# Patient Record
Sex: Female | Born: 1935 | Race: Black or African American | Hispanic: No | Smoking: Never smoker
Health system: Southern US, Community
[De-identification: ages and names within clinical notes are randomized; demographics above are authoritative.]

## PROBLEM LIST (undated history)

## (undated) DIAGNOSIS — E785 Hyperlipidemia, unspecified: Secondary | ICD-10-CM

## (undated) DIAGNOSIS — Z923 Personal history of irradiation: Secondary | ICD-10-CM

## (undated) DIAGNOSIS — E039 Hypothyroidism, unspecified: Secondary | ICD-10-CM

## (undated) DIAGNOSIS — E119 Type 2 diabetes mellitus without complications: Secondary | ICD-10-CM

## (undated) DIAGNOSIS — E079 Disorder of thyroid, unspecified: Secondary | ICD-10-CM

## (undated) DIAGNOSIS — C50919 Malignant neoplasm of unspecified site of unspecified female breast: Secondary | ICD-10-CM

## (undated) DIAGNOSIS — I10 Essential (primary) hypertension: Secondary | ICD-10-CM

## (undated) HISTORY — PX: MASTECTOMY: SHX3

## (undated) HISTORY — PX: COLONOSCOPY: SHX174

## (undated) HISTORY — PX: ESOPHAGEAL DILATION: SHX303

---

## 2011-12-09 DIAGNOSIS — R112 Nausea with vomiting, unspecified: Secondary | ICD-10-CM | POA: Insufficient documentation

## 2011-12-09 DIAGNOSIS — R634 Abnormal weight loss: Secondary | ICD-10-CM | POA: Insufficient documentation

## 2012-06-23 DIAGNOSIS — K222 Esophageal obstruction: Secondary | ICD-10-CM

## 2012-06-23 DIAGNOSIS — B3781 Candidal esophagitis: Secondary | ICD-10-CM | POA: Insufficient documentation

## 2012-12-31 DIAGNOSIS — Z01411 Encounter for gynecological examination (general) (routine) with abnormal findings: Secondary | ICD-10-CM | POA: Insufficient documentation

## 2012-12-31 DIAGNOSIS — N842 Polyp of vagina: Secondary | ICD-10-CM | POA: Insufficient documentation

## 2012-12-31 DIAGNOSIS — N3941 Urge incontinence: Secondary | ICD-10-CM | POA: Insufficient documentation

## 2012-12-31 DIAGNOSIS — N8111 Cystocele, midline: Secondary | ICD-10-CM | POA: Insufficient documentation

## 2013-03-11 DIAGNOSIS — D219 Benign neoplasm of connective and other soft tissue, unspecified: Secondary | ICD-10-CM | POA: Insufficient documentation

## 2013-11-15 DIAGNOSIS — E890 Postprocedural hypothyroidism: Secondary | ICD-10-CM | POA: Insufficient documentation

## 2014-08-16 DIAGNOSIS — N950 Postmenopausal bleeding: Secondary | ICD-10-CM | POA: Insufficient documentation

## 2015-05-06 ENCOUNTER — Ambulatory Visit
Admission: EM | Admit: 2015-05-06 | Discharge: 2015-05-06 | Disposition: A | Discharge status: Home / Self Care | Payer: Medicare Other | Attending: Family Medicine | Admitting: Family Medicine

## 2015-05-06 ENCOUNTER — Encounter: Payer: Self-pay | Admitting: Emergency Medicine

## 2015-05-06 DIAGNOSIS — I10 Essential (primary) hypertension: Secondary | ICD-10-CM | POA: Diagnosis not present

## 2015-05-06 DIAGNOSIS — E119 Type 2 diabetes mellitus without complications: Secondary | ICD-10-CM | POA: Insufficient documentation

## 2015-05-06 DIAGNOSIS — R51 Headache: Secondary | ICD-10-CM | POA: Diagnosis not present

## 2015-05-06 DIAGNOSIS — E079 Disorder of thyroid, unspecified: Secondary | ICD-10-CM | POA: Diagnosis not present

## 2015-05-06 DIAGNOSIS — R42 Dizziness and giddiness: Secondary | ICD-10-CM

## 2015-05-06 DIAGNOSIS — G44209 Tension-type headache, unspecified, not intractable: Secondary | ICD-10-CM | POA: Diagnosis not present

## 2015-05-06 DIAGNOSIS — E785 Hyperlipidemia, unspecified: Secondary | ICD-10-CM | POA: Diagnosis not present

## 2015-05-06 HISTORY — DX: Essential (primary) hypertension: I10

## 2015-05-06 HISTORY — DX: Disorder of thyroid, unspecified: E07.9

## 2015-05-06 HISTORY — DX: Type 2 diabetes mellitus without complications: E11.9

## 2015-05-06 HISTORY — DX: Hyperlipidemia, unspecified: E78.5

## 2015-05-06 LAB — GLUCOSE, CAPILLARY: Glucose-Capillary: 117 mg/dL — ABNORMAL HIGH (ref 65–99)

## 2015-05-06 NOTE — ED Notes (Signed)
Dizziness & headache started on Friday, pt thinks related to BP but wanted to be sure. Not dizzy at present, comes & goes. Headache persists.

## 2015-05-06 NOTE — ED Provider Notes (Signed)
CSN: [id number]     Arrival date & time 05/06/15  1436 History   First MD Initiated Contact with Patient 05/06/15 1546     Chief Complaint  Patient presents with  . Dizziness  . Headache   (Consider location/radiation/quality/duration/timing/severity/associated sxs/prior Treatment) HPI Comments: 79 yo female with a 1 day h/o intermittent headache all around her head but worse over the forehead and complaint of intermittent headache. Symptoms started yesterday. Currently not having any symptoms. Denies any fevers, chills, vision changes, numbness/tingling, chest pains, shortness of breath.   The history is provided by the patient.    Past Medical History  Diagnosis Date  . Diabetes mellitus without complication   . Hypertension   . Hyperlipemia   . Thyroid disease    Past Surgical History  Procedure Laterality Date  . Esophageal dilation     No family history on file. Social History  Substance Use Topics  . Smoking status: Never Smoker   . Smokeless tobacco: Never Used  . Alcohol Use: No   OB History    No data available     Review of Systems  Allergies  Codeine  Home Medications   Prior to Admission medications   Medication Sig Start Date End Date Taking? Authorizing Provider  Enalapril Maleate (VASOTEC PO) Take by mouth.   Yes Historical Provider, MD  Levothyroxine Sodium (SYNTHROID PO) Take 1 tablet by mouth daily.   Yes Historical Provider, MD  METFORMIN HCL PO Take by mouth.   Yes Historical Provider, MD  Multiple Vitamin (MULTIVITAMIN) tablet Take 1 tablet by mouth daily.   Yes Historical Provider, MD  SIMVASTATIN PO Take by mouth.   Yes Historical Provider, MD   Meds Ordered and Administered this Visit  Medications - No data to display  BP 144/78 mmHg  Pulse 81  Temp(Src) 97.9 F (36.6 C) (Oral)  Resp 20  Ht 5\' 3"  (1.6 m)  Wt 153 lb 12.8 oz (69.763 kg)  BMI 27.25 kg/m2  SpO2 98% Orthostatic VS for the past 24 hrs:  BP- Lying Pulse- Lying BP-  Sitting Pulse- Sitting BP- Standing at 0 minutes Pulse- Standing at 0 minutes  05/06/15 1519 144/78 mmHg 88 163/71 mmHg 80 167/68 mmHg 91    Physical Exam  Constitutional: She is oriented to person, place, and time. She appears well-developed and well-nourished. No distress.  HENT:  Head: Normocephalic.  Right Ear: Tympanic membrane, external ear and ear canal normal.  Left Ear: Tympanic membrane, external ear and ear canal normal.  Nose: Rhinorrhea present.  Mouth/Throat: Oropharynx is clear and moist and mucous membranes are normal. No posterior oropharyngeal edema or posterior oropharyngeal erythema.  Eyes: Conjunctivae and EOM are normal. Pupils are equal, round, and reactive to light. Right eye exhibits no discharge. Left eye exhibits no discharge. No scleral icterus.  Neck: Normal range of motion. Neck supple. No JVD present. No tracheal deviation present. No thyromegaly present.  Cardiovascular: Normal rate, regular rhythm, normal heart sounds and intact distal pulses.   No murmur heard. Pulmonary/Chest: Effort normal and breath sounds normal. No stridor. No respiratory distress. She has no wheezes. She has no rales. She exhibits no tenderness.  Musculoskeletal: She exhibits no edema.  Lymphadenopathy:    She has no cervical adenopathy.  Neurological: She is alert and oriented to person, place, and time. She has normal reflexes. She displays normal reflexes. No cranial nerve deficit. She exhibits normal muscle tone. Coordination normal.  Skin: Skin is warm. No rash noted. She is  not diaphoretic.  Psychiatric: She has a normal mood and affect. Her behavior is normal. Judgment and thought content normal.  Vitals reviewed.   ED Course  Procedures (including critical care time)  Labs Review Labs Reviewed  GLUCOSE, CAPILLARY - Abnormal; Notable for the following:    Glucose-Capillary 117 (*)    All other components within normal limits  CBG MONITORING, ED    Imaging Review No  results found.   Visual Acuity Review  Right Eye Distance:   Left Eye Distance:   Bilateral Distance:    Right Eye Near:   Left Eye Near:    Bilateral Near:         MDM   1. Tension headache   2. Dizziness   (both now resolved; asymptomatic; normal exam)  Plan: 1.diagnosis reviewed with patient 2.Recommend supportive treatment with otc analgesics, increased water intake 3. F/u prn if symptoms worsen or don't improve    Norval Gable, MD 05/06/15 1723

## 2015-07-01 ENCOUNTER — Emergency Department: Payer: Medicare Other

## 2015-07-01 ENCOUNTER — Observation Stay
Admission: EM | Admit: 2015-07-01 | Discharge: 2015-07-02 | Disposition: A | Discharge status: Home / Self Care | Payer: Medicare Other | Attending: Specialist | Admitting: Specialist

## 2015-07-01 ENCOUNTER — Encounter: Payer: Self-pay | Admitting: Emergency Medicine

## 2015-07-01 DIAGNOSIS — H409 Unspecified glaucoma: Secondary | ICD-10-CM | POA: Insufficient documentation

## 2015-07-01 DIAGNOSIS — I639 Cerebral infarction, unspecified: Secondary | ICD-10-CM | POA: Diagnosis present

## 2015-07-01 DIAGNOSIS — Z7984 Long term (current) use of oral hypoglycemic drugs: Secondary | ICD-10-CM | POA: Diagnosis not present

## 2015-07-01 DIAGNOSIS — E119 Type 2 diabetes mellitus without complications: Secondary | ICD-10-CM | POA: Insufficient documentation

## 2015-07-01 DIAGNOSIS — R29810 Facial weakness: Secondary | ICD-10-CM

## 2015-07-01 DIAGNOSIS — Z8673 Personal history of transient ischemic attack (TIA), and cerebral infarction without residual deficits: Secondary | ICD-10-CM | POA: Diagnosis present

## 2015-07-01 DIAGNOSIS — Z79899 Other long term (current) drug therapy: Secondary | ICD-10-CM | POA: Diagnosis not present

## 2015-07-01 DIAGNOSIS — E785 Hyperlipidemia, unspecified: Secondary | ICD-10-CM | POA: Insufficient documentation

## 2015-07-01 DIAGNOSIS — I739 Peripheral vascular disease, unspecified: Secondary | ICD-10-CM | POA: Diagnosis not present

## 2015-07-01 DIAGNOSIS — I10 Essential (primary) hypertension: Secondary | ICD-10-CM | POA: Diagnosis not present

## 2015-07-01 DIAGNOSIS — K219 Gastro-esophageal reflux disease without esophagitis: Secondary | ICD-10-CM | POA: Diagnosis not present

## 2015-07-01 DIAGNOSIS — Z853 Personal history of malignant neoplasm of breast: Secondary | ICD-10-CM | POA: Diagnosis not present

## 2015-07-01 DIAGNOSIS — G510 Bell's palsy: Secondary | ICD-10-CM | POA: Diagnosis not present

## 2015-07-01 DIAGNOSIS — E039 Hypothyroidism, unspecified: Secondary | ICD-10-CM | POA: Diagnosis not present

## 2015-07-01 DIAGNOSIS — R51 Headache: Secondary | ICD-10-CM | POA: Diagnosis not present

## 2015-07-01 DIAGNOSIS — R4781 Slurred speech: Secondary | ICD-10-CM | POA: Insufficient documentation

## 2015-07-01 LAB — COMPREHENSIVE METABOLIC PANEL
ALT: 14 U/L (ref 14–54)
ANION GAP: 7 (ref 5–15)
AST: 21 U/L (ref 15–41)
Albumin: 4 g/dL (ref 3.5–5.0)
Alkaline Phosphatase: 54 U/L (ref 38–126)
BUN: 14 mg/dL (ref 6–20)
CHLORIDE: 100 mmol/L — AB (ref 101–111)
CO2: 30 mmol/L (ref 22–32)
Calcium: 9.6 mg/dL (ref 8.9–10.3)
Creatinine, Ser: 0.83 mg/dL (ref 0.44–1.00)
GFR calc non Af Amer: >60 mL/min (ref >60)
Glucose, Bld: 148 mg/dL — ABNORMAL HIGH (ref 65–99)
POTASSIUM: 3.3 mmol/L — AB (ref 3.5–5.1)
SODIUM: 137 mmol/L (ref 135–145)
Total Bilirubin: 0.4 mg/dL (ref 0.3–1.2)
Total Protein: 8.3 g/dL — ABNORMAL HIGH (ref 6.5–8.1)

## 2015-07-01 LAB — CBC WITH DIFFERENTIAL/PLATELET
Basophils Absolute: 0.1 10*3/uL (ref 0–0.1)
Basophils Relative: 1 %
EOS ABS: 0.4 10*3/uL (ref 0–0.7)
EOS PCT: 5 %
HCT: 38.5 % (ref 35.0–47.0)
Hemoglobin: 12.5 g/dL (ref 12.0–16.0)
LYMPHS ABS: 3.3 10*3/uL (ref 1.0–3.6)
Lymphocytes Relative: 42 %
MCH: 27.3 pg (ref 26.0–34.0)
MCHC: 32.5 g/dL (ref 32.0–36.0)
MCV: 84 fL (ref 80.0–100.0)
MONO ABS: 0.5 10*3/uL (ref 0.2–0.9)
MONOS PCT: 6 %
Neutro Abs: 3.7 10*3/uL (ref 1.4–6.5)
Neutrophils Relative %: 46 %
PLATELETS: 331 10*3/uL (ref 150–440)
RBC: 4.58 MIL/uL (ref 3.80–5.20)
RDW: 13.2 % (ref 11.5–14.5)
WBC: 8.1 10*3/uL (ref 3.6–11.0)

## 2015-07-01 LAB — GLUCOSE, CAPILLARY: Glucose-Capillary: 160 mg/dL — ABNORMAL HIGH (ref 65–99)

## 2015-07-01 LAB — TROPONIN I

## 2015-07-01 MED ORDER — ADULT MULTIVITAMIN W/MINERALS CH
1.0000 | ORAL_TABLET | Freq: Every day | ORAL | Status: DC
Start: 2015-07-02 — End: 2015-07-02
  Administered 2015-07-02: 09:00:00 1 via ORAL
  Filled 2015-07-01: qty 1

## 2015-07-01 MED ORDER — ACETAMINOPHEN 325 MG PO TABS: 650.0000 mg | ORAL_TABLET | Freq: Four times a day (QID) | ORAL | Status: DC | PRN

## 2015-07-01 MED ORDER — ONDANSETRON HCL 4 MG/2ML IJ SOLN: 4.0000 mg | Freq: Four times a day (QID) | INTRAMUSCULAR | Status: DC | PRN

## 2015-07-01 MED ORDER — ACETAMINOPHEN 650 MG RE SUPP: 650.0000 mg | Freq: Four times a day (QID) | RECTAL | Status: DC | PRN

## 2015-07-01 MED ORDER — SIMVASTATIN 20 MG PO TABS
20.0000 mg | ORAL_TABLET | Freq: Every day | ORAL | Status: DC
  Administered 2015-07-02: 01:00:00 20 mg via ORAL
  Filled 2015-07-01: qty 1

## 2015-07-01 MED ORDER — INSULIN ASPART 100 UNIT/ML ~~LOC~~ SOLN: 0.0000 [IU] | Freq: Every day | SUBCUTANEOUS | Status: DC

## 2015-07-01 MED ORDER — LATANOPROST 0.005 % OP SOLN
1.0000 [drp] | Freq: Every day | OPHTHALMIC | Status: DC
  Filled 2015-07-01: qty 2.5

## 2015-07-01 MED ORDER — BRINZOLAMIDE 1 % OP SUSP
1.0000 [drp] | Freq: Two times a day (BID) | OPHTHALMIC | Status: DC
  Administered 2015-07-02: 1 [drp] via OPHTHALMIC
  Filled 2015-07-01: qty 10

## 2015-07-01 MED ORDER — ASPIRIN 325 MG PO TABS
325.0000 mg | ORAL_TABLET | Freq: Every day | ORAL | Status: DC
Start: 2015-07-02 — End: 2015-07-02
  Administered 2015-07-02: 09:00:00 325 mg via ORAL
  Filled 2015-07-01: qty 1

## 2015-07-01 MED ORDER — ASPIRIN 81 MG PO CHEW
324.0000 mg | CHEWABLE_TABLET | Freq: Once | ORAL | Status: AC
  Administered 2015-07-01: 324 mg via ORAL
  Filled 2015-07-01: qty 4

## 2015-07-01 MED ORDER — BRIMONIDINE TARTRATE 0.15 % OP SOLN
1.0000 [drp] | Freq: Two times a day (BID) | OPHTHALMIC | Status: DC
  Administered 2015-07-02: 1 [drp] via OPHTHALMIC
  Filled 2015-07-01: qty 5

## 2015-07-01 MED ORDER — VALSARTAN 160 MG PO TABS
320.0000 mg | ORAL_TABLET | Freq: Every day | ORAL | Status: DC
  Administered 2015-07-02: 09:00:00 320 mg via ORAL
  Filled 2015-07-01: qty 2

## 2015-07-01 MED ORDER — PANTOPRAZOLE SODIUM 40 MG PO TBEC
40.0000 mg | DELAYED_RELEASE_TABLET | Freq: Every day | ORAL | Status: DC
  Administered 2015-07-02: 40 mg via ORAL
  Filled 2015-07-01: qty 1

## 2015-07-01 MED ORDER — PREDNISONE 50 MG PO TABS
60.0000 mg | ORAL_TABLET | Freq: Every day | ORAL | Status: DC
  Filled 2015-07-01: qty 1

## 2015-07-01 MED ORDER — ASPIRIN 300 MG RE SUPP: 300.0000 mg | Freq: Every day | RECTAL | Status: DC

## 2015-07-01 MED ORDER — SODIUM CHLORIDE 0.9 % IJ SOLN
3.0000 mL | Freq: Two times a day (BID) | INTRAMUSCULAR | Status: DC
  Administered 2015-07-02 (×2): 3 mL via INTRAVENOUS

## 2015-07-01 MED ORDER — HYDROCHLOROTHIAZIDE 25 MG PO TABS
25.0000 mg | ORAL_TABLET | Freq: Every day | ORAL | Status: DC
  Administered 2015-07-02: 25 mg via ORAL
  Filled 2015-07-01: qty 1

## 2015-07-01 MED ORDER — LEVOTHYROXINE SODIUM 88 MCG PO TABS
88.0000 ug | ORAL_TABLET | Freq: Every day | ORAL | Status: DC
  Administered 2015-07-02: 88 ug via ORAL
  Filled 2015-07-01: qty 1

## 2015-07-01 MED ORDER — ONDANSETRON HCL 4 MG PO TABS: 4.0000 mg | ORAL_TABLET | Freq: Four times a day (QID) | ORAL | Status: DC | PRN

## 2015-07-01 MED ORDER — TIMOLOL MALEATE 0.5 % OP SOLN
1.0000 [drp] | Freq: Two times a day (BID) | OPHTHALMIC | Status: DC
  Administered 2015-07-02: 09:00:00 1 [drp] via OPHTHALMIC
  Filled 2015-07-01: qty 5

## 2015-07-01 MED ORDER — INSULIN ASPART 100 UNIT/ML ~~LOC~~ SOLN
0.0000 [IU] | Freq: Three times a day (TID) | SUBCUTANEOUS | Status: DC
  Administered 2015-07-02: 12:00:00 3 [IU] via SUBCUTANEOUS
  Administered 2015-07-02: 08:00:00 1 [IU] via SUBCUTANEOUS
  Filled 2015-07-01: qty 3
  Filled 2015-07-01: qty 1

## 2015-07-01 MED ORDER — STROKE: EARLY STAGES OF RECOVERY BOOK
Freq: Once | Status: AC
  Administered 2015-07-02

## 2015-07-01 MED ORDER — VALSARTAN-HYDROCHLOROTHIAZIDE 320-25 MG PO TABS: 1.0000 | ORAL_TABLET | Freq: Every day | ORAL | Status: DC

## 2015-07-01 NOTE — H&P (Signed)
[facility] [place] NAME: Heather Small    MR#:  [id number]  DATE OF BIRTH:  [birth year]   DATE OF ADMISSION:  07/01/2015  PRIMARY CARE PHYSICIAN: No PCP Per Patient   REQUESTING/REFERRING PHYSICIAN: Archie Balboa  CHIEF COMPLAINT:   Chief Complaint  Patient presents with  . Code Stroke    HISTORY OF PRESENT ILLNESS:  Heather Small  is a 79 y.o. female with a known history of essential hypertension, type 2 diabetes non-insulin-requiring who is presenting with right-sided facial droop. Approximately 2 day duration right sided facial droop with associated numbness and perioral, intermittent slurred speech at the emergency room family she presented to Hospital further workup and evaluation. She states she did have mild URI-like symptoms about 1 week prior to onset of new symptoms. Her speech is improved back to baseline however still has right-sided facial droop  PAST MEDICAL HISTORY:   Past Medical History  Diagnosis Date  . Diabetes mellitus without complication ([place])   . Hypertension   . Hyperlipemia   . Thyroid disease     PAST SURGICAL HISTORY:   Past Surgical History  Procedure Laterality Date  . Esophageal dilation      SOCIAL HISTORY:   Social History  Substance Use Topics  . Smoking status: Never Smoker   . Smokeless tobacco: Never Used  . Alcohol Use: No    FAMILY HISTORY:   Family History  Problem Relation Age of Onset  . Diabetes Other     DRUG ALLERGIES:   Allergies  Allergen Reactions  . Codeine Other (See Comments)    Couldn't swallow, was rushed to hospital    REVIEW OF SYSTEMS:  REVIEW OF SYSTEMS:  CONSTITUTIONAL: Denies fevers, chills, fatigue, weakness.  EYES: Denies blurred vision, double vision, or eye pain.  EARS, NOSE, THROAT: Denies tinnitus, ear pain, hearing loss.  RESPIRATORY: denies cough, shortness of breath, wheezing  CARDIOVASCULAR: Denies chest pain, palpitations, edema.   GASTROINTESTINAL: Denies nausea, vomiting, diarrhea, abdominal pain.  GENITOURINARY: Denies dysuria, hematuria.  ENDOCRINE: Denies nocturia or thyroid problems. HEMATOLOGIC AND LYMPHATIC: Denies easy bruising or bleeding.  SKIN: Denies rash or lesions.  MUSCULOSKELETAL: Denies pain in neck, back, shoulder, knees, hips, or further arthritic symptoms.  NEUROLOGIC: Denies paralysis, positive perioral paresthesias.  PSYCHIATRIC: Denies anxiety or depressive symptoms. Otherwise full review of systems performed by me is negative.   MEDICATIONS AT HOME:   Prior to Admission medications   Medication Sig Start Date End Date Taking? Authorizing Provider  AZOPT 1 % ophthalmic suspension Place 1 drop into both eyes 2 (two) times daily. 04/11/15  Yes Historical Provider, MD  brimonidine (ALPHAGAN) 0.15 % ophthalmic solution Place 1 drop into both eyes 2 (two) times daily. 04/11/15  Yes Historical Provider, MD  levothyroxine (SYNTHROID, LEVOTHROID) 88 MCG tablet Take 1 tablet by mouth daily. 06/05/15  Yes Historical Provider, MD  LUMIGAN 0.01 % SOLN Place 1 drop into both eyes at bedtime. 04/11/15  Yes Historical Provider, MD  metFORMIN (GLUCOPHAGE) 500 MG tablet Take 1 tablet by mouth 2 (two) times daily. 06/15/15  Yes Historical Provider, MD  Multiple Vitamin (MULTIVITAMIN) tablet Take 1 tablet by mouth daily.   Yes Historical Provider, MD  omeprazole (PRILOSEC) 20 MG capsule Take 1 capsule by mouth at bedtime. 04/13/15  Yes Historical Provider, MD  simvastatin (ZOCOR) 20 MG tablet Take 1 tablet by mouth at bedtime. 06/15/15  Yes Historical Provider, MD  timolol (TIMOPTIC) 0.5 % ophthalmic solution  Place 1 drop into both eyes 2 (two) times daily. 04/11/15  Yes Historical Provider, MD  valsartan-hydrochlorothiazide (DIOVAN-HCT) 320-25 MG tablet Take 1 tablet by mouth daily. 04/13/15  Yes Historical Provider, MD      VITAL SIGNS:  Blood pressure 102/60, pulse 103, temperature 98.4 F (36.9 C), temperature  source Oral, resp. rate 18, height 5\' 4"  (1.626 m), weight 156 lb (70.761 kg), SpO2 99 %.  PHYSICAL EXAMINATION:  VITAL SIGNS: Filed Vitals:   07/01/15 2230  BP: 102/60  Pulse: 103  Temp:   Resp: 18   GENERAL:79 y.o.female currently in no acute distress.  HEAD: Normocephalic, atraumatic.  EYES: Pupils equal, round, reactive to light. Extraocular muscles intact. No scleral icterus.  MOUTH: Moist mucosal membrane. Dentition intact. No abscess noted.  EAR, NOSE, THROAT: Clear without exudates. No external lesions.  NECK: Supple. No thyromegaly. No nodules. No JVD.  PULMONARY: Clear to ascultation, without wheeze rails or rhonci. No use of accessory muscles, Good respiratory effort. good air entry bilaterally CHEST: Nontender to palpation.  CARDIOVASCULAR: S1 and S2. Regular rate and rhythm. No murmurs, rubs, or gallops. No edema. Pedal pulses 2+ bilaterally.  GASTROINTESTINAL: Soft, nontender, nondistended. No masses. Positive bowel sounds. No hepatosplenomegaly.  MUSCULOSKELETAL: No swelling, clubbing, or edema. Range of motion full in all extremities.  NEUROLOGIC: Findings consistent with cranial nerve VII palsy - right-sided facial droop, drooping eyelid, unable to raise brow otherwise Cranial nerves II through XII are intact. No gross focal neurological deficits. Sensation intact. Reflexes intact.  SKIN: No ulceration, lesions, rashes, or cyanosis. Skin warm and dry. Turgor intact.  PSYCHIATRIC: Mood, affect within normal limits. The patient is awake, alert and oriented x 3. Insight, judgment intact.    LABORATORY PANEL:   CBC  Recent Labs Lab 07/01/15 2034  WBC 8.1  HGB 12.5  HCT 38.5  PLT 331   ------------------------------------------------------------------------------------------------------------------  Chemistries   Recent Labs Lab 07/01/15 2034  NA 137  K 3.3*  CL 100*  CO2 30  GLUCOSE 148*  BUN 14  CREATININE 0.83  CALCIUM 9.6  AST 21  ALT 14   ALKPHOS 54  BILITOT 0.4   ------------------------------------------------------------------------------------------------------------------  Cardiac Enzymes  Recent Labs Lab 07/01/15 2034  TROPONINI <0.03   ------------------------------------------------------------------------------------------------------------------  RADIOLOGY:  Ct Head Wo Contrast  07/01/2015  CLINICAL DATA:  Left facial droop for 2 days with slurred speech. History of breast cancer. Severe headache last night. Diabetes. EXAM: CT HEAD WITHOUT CONTRAST TECHNIQUE: Contiguous axial images were obtained from the base of the skull through the vertex without intravenous contrast. COMPARISON:  None. FINDINGS: Sinuses/Soft tissues: Clear paranasal sinuses and mastoid air cells. Intracranial: Mild low density in the periventricular white matter likely related to small vessel disease. No mass lesion, hemorrhage, hydrocephalus, acute infarct, intra-axial, or extra-axial fluid collection. IMPRESSION: 1.  No acute intracranial abnormality. 2. Mild small vessel ischemic change. These results were called by telephone at the time of interpretation on 07/01/2015 at 8:17 pm to Dr. Harvest Dark , who verbally acknowledged these results. Electronically Signed   By: Abigail Miyamoto M.D.   On: 07/01/2015 20:18    EKG:  No orders found for this or any previous visit.  IMPRESSION AND PLAN:   79 year old African-American female history of essential hypertension, type 2 diabetes non-insulin-requiring reason right-sided facial droop  1. Stroke, unspecified: Code stroke initiated in emergency department continue aspirin statin therapy, check MRI-physical exam findings more consistent with Bell's palsy will initiate prednisone 60 mg daily total 1 week  duration 2. Essential hypertension: Diovan 3. Type 2 diabetes non-insulin-requiring: Hold oral agents at insulin sliding scale 4. Hyperlipidemia unspecified statin therapy 5. Venous  thromboembolism prophylactic: SCD    All the records are reviewed and case discussed with ED provider. Management plans discussed with the patient, family and they are in agreement.  CODE STATUS: Full  TOTAL TIME TAKING CARE OF THIS PATIENT: 35 minutes.    Hower,  Karenann Cai.D on 07/01/2015 at 10:36 PM  Between 7am to 6pm - Pager - [phone]  After 6pm: House Pager: - [phone]  [facility]  Office  [phone]  CC: Primary care physician; No PCP Per Patient

## 2015-07-01 NOTE — ED Notes (Signed)
Pt states that two days ago she felt like her mouth was swelling and noticed her mouth was drooping to one side. Pt has drooping to right side of mouth and is unable to squeeze right eye shut. Family states they noticed her speech was slurring last night.

## 2015-07-01 NOTE — ED Provider Notes (Signed)
[facility] Emergency Department Provider Note   ____________________________________________  Time seen: 2040  I have reviewed the triage vital signs and the nursing notes.   HISTORY  Chief Complaint Code Stroke   History limited by: Not Limited   HPI Heather Small is a 79 y.o. female who presents to the emergency department today with concerns for left sided facial droop and slurred speech. The patient stated it started yesterday morning so roughly 35 hours ago. Family states that they did talk to her on the phone yesterday and noticed some slurred speech. Additionally the patient stated she noticed her left face was drooping yesterday. The patient states she did have some headache yesterday however denies any headache today. Denies any trauma to her head. Denies similar symptoms in the past. Denies any fevers.   Past Medical History  Diagnosis Date  . Diabetes mellitus without complication   . Hypertension   . Hyperlipemia   . Thyroid disease     There are no active problems to display for this patient.   Past Surgical History  Procedure Laterality Date  . Esophageal dilation      Current Outpatient Rx  Name  Route  Sig  Dispense  Refill  . Enalapril Maleate (VASOTEC PO)   Oral   Take by mouth.         . Levothyroxine Sodium (SYNTHROID PO)   Oral   Take 1 tablet by mouth daily.         Marland Kitchen METFORMIN HCL PO   Oral   Take by mouth.         . Multiple Vitamin (MULTIVITAMIN) tablet   Oral   Take 1 tablet by mouth daily.         Marland Kitchen SIMVASTATIN PO   Oral   Take by mouth.           Allergies Codeine  No family history on file.  Social History Social History  Substance Use Topics  . Smoking status: Never Smoker   . Smokeless tobacco: Never Used  . Alcohol Use: No    Review of Systems  Constitutional: Negative for fever. Cardiovascular: Negative for chest pain. Respiratory: Negative for shortness of  breath. Gastrointestinal: Negative for abdominal pain, vomiting and diarrhea. Genitourinary: Negative for dysuria. Musculoskeletal: Negative for back pain. Skin: Negative for rash. Neurological: positive for slurred speech. Positive for left-sided facial droop.  10-point ROS otherwise negative.  ____________________________________________   PHYSICAL EXAM:  VITAL SIGNS:   98.4 F (36.9 C)  99   21   163/78 mmHg  95 %    Constitutional: Alert and oriented. Left facial droop. Eyes: Conjunctivae are normal. PERRL. Normal extraocular movements. ENT   Head: Normocephalic and atraumatic.   Nose: No congestion/rhinnorhea.   Mouth/Throat: Mucous membranes are moist.   Neck: No stridor. Hematological/Lymphatic/Immunilogical: No cervical lymphadenopathy. Cardiovascular: Normal rate, regular rhythm.  No murmurs, rubs, or gallops. Respiratory: Normal respiratory effort without tachypnea nor retractions. Breath sounds are clear and equal bilaterally. No wheezes/rales/rhonchi. Gastrointestinal: Soft and nontender. No distention. There is no CVA tenderness. Genitourinary: Deferred Musculoskeletal: Normal range of motion in all extremities. No joint effusions.  No lower extremity tenderness nor edema. Neurologic:  Slurred speech. Left facial droop. Symmetric eyebrow raise. Symmetric palate. Perhaps slight right tongue deviation. Strength 5 out of 5 in upper and lower extremities. No drift. Sensation intact. NIHSS 3 Skin:  Skin is warm, dry and intact. No rash noted. Psychiatric: Mood and affect are normal. Speech and  behavior are normal. Patient exhibits appropriate insight and judgment.  ____________________________________________    LABS (pertinent positives/negatives)  Labs Reviewed  COMPREHENSIVE METABOLIC PANEL - Abnormal; Notable for the following:    Potassium 3.3 (*)    Chloride 100 (*)    Glucose, Bld 148 (*)    Total Protein 8.3 (*)    All other components  within normal limits  CBC WITH DIFFERENTIAL/PLATELET  TROPONIN I     ____________________________________________   EKG  I, Nance Pear, attending physician, personally viewed and interpreted this EKG  EKG Time: 2022 Rate: 97 Rhythm: NSR Axis: normal Intervals: qtc 451 QRS: narrow ST changes: no st elevation Impression: normal ekg ____________________________________________    RADIOLOGY  CT Head IMPRESSION: 1. No acute intracranial abnormality. 2. Mild small vessel ischemic change.  ____________________________________________   PROCEDURES  Procedure(s) performed: None  Critical Care performed: No  ____________________________________________   INITIAL IMPRESSION / ASSESSMENT AND PLAN / ED COURSE  Pertinent labs & imaging results that were available during my care of the patient were reviewed by me and considered in my medical decision making (see chart for details).  Patient presented to the emergency department today because of concerns for slurred speech and facial droop. On exam patient certainly does have slurred speech and some facial droop. I get a NIH stroke scale of 3. Patient is not a candidate for TPA given the length of symptoms. Patient has risk factors for stroke thus I will admit the patient for further stroke workup and management.  ____________________________________________   FINAL CLINICAL IMPRESSION(S) / ED DIAGNOSES  Final diagnoses:  Facial droop     Nance Pear, MD 07/02/15 1505

## 2015-07-01 NOTE — ED Notes (Signed)
MD at bedside. 

## 2015-07-02 ENCOUNTER — Observation Stay: Payer: Medicare Other

## 2015-07-02 DIAGNOSIS — G510 Bell's palsy: Secondary | ICD-10-CM | POA: Diagnosis not present

## 2015-07-02 LAB — LIPID PANEL
Cholesterol: 120 mg/dL (ref 0–200)
HDL: 38 mg/dL — AB (ref >40)
LDL Cholesterol: 66 mg/dL (ref 0–99)
Total CHOL/HDL Ratio: 3.2 RATIO
Triglycerides: 81 mg/dL (ref <150)
VLDL: 16 mg/dL (ref 0–40)

## 2015-07-02 LAB — HEMOGLOBIN A1C: HEMOGLOBIN A1C: 6.7 % — AB (ref 4.0–6.0)

## 2015-07-02 LAB — GLUCOSE, CAPILLARY
Glucose-Capillary: 128 mg/dL — ABNORMAL HIGH (ref 65–99)
Glucose-Capillary: 213 mg/dL — ABNORMAL HIGH (ref 65–99)

## 2015-07-02 MED ORDER — PREDNISONE 10 MG PO TABS: ORAL_TABLET | ORAL | Status: DC

## 2015-07-02 MED ORDER — PREDNISONE 50 MG PO TABS
60.0000 mg | ORAL_TABLET | Freq: Every day | ORAL | Status: DC
  Administered 2015-07-02: 60 mg via ORAL
  Filled 2015-07-02: qty 1

## 2015-07-02 NOTE — Discharge Summary (Signed)
[place] [facility] [place] NAME: Heather Small    MR#:  [id number]  DATE OF BIRTH:  [birth year]  DATE OF ADMISSION:  07/01/2015 ADMITTING PHYSICIAN: Lytle Butte, MD  DATE OF DISCHARGE: 07/02/2015 12:55 PM  PRIMARY CARE PHYSICIAN: No PCP Per Patient    ADMISSION DIAGNOSIS:  Slurred speech [R47.81] Facial droop [R29.810]  DISCHARGE DIAGNOSIS:  Principal Problem:   Stroke [facility])   SECONDARY DIAGNOSIS:   Past Medical History  Diagnosis Date  . Diabetes mellitus without complication ([place])   . Hypertension   . Hyperlipemia   . Thyroid disease     HOSPITAL COURSE:   79 year old female with past medical history of diabetes, hypertension, GERD, glaucoma, hyperlipidemia who presented to the hospital due to right sided facial droop weakness and numbness.  #1Bell's Palsy - this is the cause of patient's right-sided facial weakness and numbness. -Given the patient's history of diabetes, hypertension she underwent an extensive workup for stroke which was normal. She had a negative CT head, negative MRI of the brain. Patient did have a recent URI about a week or so ago and then developed right-sided facial weakness and numbness. This is likely Bell's palsy and she will continue supportive care upon discharge. -She was discharged on a prednisone taper.  #2 hypertension-patient remained hemodynamically stable she will continue Valsartan, HCTZ  #3 hypothyroidism-patient will continue her Synthroid.  #4 diabetes type 2-patient will continue her metformin.  #5 hyperlipidemia-patient will continue her simvastatin.  #6 glaucoma-patient will continue her brimonidine and timolol eyedrops.  DISCHARGE CONDITIONS:   Stable  CONSULTS OBTAINED:  Treatment Team:  Lytle Butte, MD  DRUG ALLERGIES:   Allergies  Allergen Reactions  . Codeine Other (See Comments)    Couldn't swallow, was rushed to hospital    DISCHARGE MEDICATIONS:    Discharge Medication List as of 07/02/2015 12:01 PM    START taking these medications   Details  predniSONE (DELTASONE) 10 MG tablet Label  & dispense according to the schedule below. 5 Pills PO for 1 day then, 4 Pills PO for 1 day, 3 Pills PO for 1 day, 2 Pills PO for 1 day, 1 Pill PO for 1 days then STOP., Print      CONTINUE these medications which have NOT CHANGED   Details  AZOPT 1 % ophthalmic suspension Place 1 drop into both eyes 2 (two) times daily., Starting 04/11/2015, Until Discontinued, Historical Med    brimonidine (ALPHAGAN) 0.15 % ophthalmic solution Place 1 drop into both eyes 2 (two) times daily., Starting 04/11/2015, Until Discontinued, Historical Med    levothyroxine (SYNTHROID, LEVOTHROID) 88 MCG tablet Take 1 tablet by mouth daily., Starting 06/05/2015, Until Discontinued, Historical Med    LUMIGAN 0.01 % SOLN Place 1 drop into both eyes at bedtime., Starting 04/11/2015, Until Discontinued, Historical Med    metFORMIN (GLUCOPHAGE) 500 MG tablet Take 1 tablet by mouth 2 (two) times daily., Starting 06/15/2015, Until Discontinued, Historical Med    Multiple Vitamin (MULTIVITAMIN) tablet Take 1 tablet by mouth daily., Until Discontinued, Historical Med    omeprazole (PRILOSEC) 20 MG capsule Take 1 capsule by mouth at bedtime., Starting 04/13/2015, Until Discontinued, Historical Med    simvastatin (ZOCOR) 20 MG tablet Take 1 tablet by mouth at bedtime., Starting 06/15/2015, Until Discontinued, Historical Med    timolol (TIMOPTIC) 0.5 % ophthalmic solution Place 1 drop into both eyes 2 (two) times daily., Starting 04/11/2015, Until Discontinued, Historical Med    valsartan-hydrochlorothiazide (  DIOVAN-HCT) 320-25 MG tablet Take 1 tablet by mouth daily., Starting 04/13/2015, Until Discontinued, Historical Med         DISCHARGE INSTRUCTIONS:   DIET:  Cardiac diet and Diabetic diet  DISCHARGE CONDITION:  Stable  ACTIVITY:  Activity as tolerated  OXYGEN:  Home  Oxygen: No.   Oxygen Delivery: room air  DISCHARGE LOCATION:  home   If you experience worsening of your admission symptoms, develop shortness of breath, life threatening emergency, suicidal or homicidal thoughts you must seek medical attention immediately by calling 911 or calling your MD immediately  if symptoms less severe.  You Must read complete instructions/literature along with all the possible adverse reactions/side effects for all the Medicines you take and that have been prescribed to you. Take any new Medicines after you have completely understood and accpet all the possible adverse reactions/side effects.   Please note  You were cared for by a hospitalist during your hospital stay. If you have any questions about your discharge medications or the care you received while you were in the hospital after you are discharged, you can call the unit and asked to speak with the hospitalist on call if the hospitalist that took care of you is not available. Once you are discharged, your primary care physician will handle any further medical issues. Please note that NO REFILLS for any discharge medications will be authorized once you are discharged, as it is imperative that you return to your primary care physician (or establish a relationship with a primary care physician if you do not have one) for your aftercare needs so that they can reassess your need for medications and monitor your lab values.     Today   Still has some right-sided facial weakness and droop. Patient's right eye is slightly injected. No other focal complaints. Daughter at bedside.  VITAL SIGNS:  Blood pressure 143/63, pulse 79, temperature 97.4 F (36.3 C), temperature source Oral, resp. rate 19, height 5\' 4"  (1.626 m), weight 70.761 kg (156 lb), SpO2 96 %.  I/O:   Intake/Output Summary (Last 24 hours) at 07/02/15 1423 Last data filed at 07/02/15 0900  Gross per 24 hour  Intake    240 ml  Output      0 ml  Net     240 ml    PHYSICAL EXAMINATION:  GENERAL:  79 y.o.-year-old patient lying in the bed with no acute distress.  EYES: Pupils equal, round, reactive to light and accommodation. No scleral icterus. Extraocular muscles intact.  HEENT: Head atraumatic, normocephalic. Oropharynx and nasopharynx clear.  NECK:  Supple, no jugular venous distention. No thyroid enlargement, no tenderness.  LUNGS: Normal breath sounds bilaterally, no wheezing, rales,rhonchi. No use of accessory muscles of respiration.  CARDIOVASCULAR: S1, S2 normal. No murmurs, rubs, or gallops.  ABDOMEN: Soft, non-tender, non-distended. Bowel sounds present. No organomegaly or mass.  EXTREMITIES: No pedal edema, cyanosis, or clubbing.  NEUROLOGIC: Cranial nerves II through XII are intact. No focal motor or sensory defecits b/l. Right-sided facial droop. PSYCHIATRIC: The patient is alert and oriented x 3. Good affect.  SKIN: No obvious rash, lesion, or ulcer.   DATA REVIEW:   CBC  Recent Labs Lab 07/01/15 2034  WBC 8.1  HGB 12.5  HCT 38.5  PLT 331    Chemistries   Recent Labs Lab 07/01/15 2034  NA 137  K 3.3*  CL 100*  CO2 30  GLUCOSE 148*  BUN 14  CREATININE 0.83  CALCIUM 9.6  AST 21  ALT 14  ALKPHOS 54  BILITOT 0.4    Cardiac Enzymes  Recent Labs Lab 07/01/15 2034  TROPONINI <0.03    Microbiology Results  No results found for this or any previous visit.  RADIOLOGY:  Ct Head Wo Contrast  07/01/2015  CLINICAL DATA:  Left facial droop for 2 days with slurred speech. History of breast cancer. Severe headache last night. Diabetes. EXAM: CT HEAD WITHOUT CONTRAST TECHNIQUE: Contiguous axial images were obtained from the base of the skull through the vertex without intravenous contrast. COMPARISON:  None. FINDINGS: Sinuses/Soft tissues: Clear paranasal sinuses and mastoid air cells. Intracranial: Mild low density in the periventricular white matter likely related to small vessel disease. No mass lesion,  hemorrhage, hydrocephalus, acute infarct, intra-axial, or extra-axial fluid collection. IMPRESSION: 1.  No acute intracranial abnormality. 2. Mild small vessel ischemic change. These results were called by telephone at the time of interpretation on 07/01/2015 at 8:17 pm to Dr. Harvest Dark , who verbally acknowledged these results. Electronically Signed   By: Abigail Miyamoto M.D.   On: 07/01/2015 20:18   Mr Brain Wo Contrast  07/02/2015  CLINICAL DATA:  Stroke. Headache. Left facial droop. History breast cancer. EXAM: MRI HEAD WITHOUT CONTRAST TECHNIQUE: Multiplanar, multiecho pulse sequences of the brain and surrounding structures were obtained without intravenous contrast. COMPARISON:  CT head 07/01/2015 FINDINGS: Ventricle size normal. Cerebral volume normal. Pituitary normal in size. Cervical medullary junction normal. No focal bony lesion is seen. Negative for acute infarct. Mild chronic microvascular ischemic change in the white matter. Brainstem is normal. Negative for intracranial hemorrhage. No mass or edema. No evidence of metastatic disease on unenhanced imaging. Paranasal sinuses clear.  Negative for orbital mass lesion. IMPRESSION: Mild chronic microvascular ischemia. No acute infarct. Negative for mass lesion. Electronically Signed   By: Franchot Gallo M.D.   On: 07/02/2015 11:32      Management plans discussed with the patient, family and they are in agreement.  CODE STATUS:     Code Status Orders        Start     Ordered   07/01/15 2146  Full code   Continuous     07/01/15 2146      TOTAL TIME TAKING CARE OF THIS PATIENT: 40 minutes.    Henreitta Leber M.D on 07/02/2015 at 2:23 PM  Between 7am to 6pm - Pager - [phone]  After 6pm go to [email or web address] - password [email or web address] [facility]  [facility]  Office  [phone]  CC: Primary care physician; No PCP Per Patient

## 2015-07-02 NOTE — Progress Notes (Signed)
Discussed discharge instructions and medication with pt and her daughter's at bedside.  All question and concerns addressed.  Written information given on Bell's palsy.  IV removed per policy.  Pt transported home via car by her daughter.  Clarise Cruz, RN

## 2015-07-02 NOTE — Progress Notes (Signed)
Physical Therapy Evaluation Patient Details Name: Heather Small MRN: [id number] DOB: 1935-11-19 Today's Date: 07/02/2015   History of Present Illness  Patient is a 79 y.o. female admitted on 19 Nov. for possible CVA, noting R facial droop and slurred speech. Currently trying to differentiate between CVA and Bell's Palsy. PMH includes HTN and DMII.  Clinical Impression  Patient is a previously independent female admitted after experiencing R sided facial droop. Patient demonstrates no myotomal/sensory/coordination deficits. Able to perform all mobility and gait independently without LOB. Shows good safety awareness. Patient is at baseline level of functioning, so no PT is indicated at this time.    Follow Up Recommendations No PT follow up    Equipment Recommendations  None recommended by PT    Recommendations for Other Services       Precautions / Restrictions Precautions Precautions: None Restrictions Weight Bearing Restrictions: No      Mobility  Bed Mobility Overal bed mobility: Independent             General bed mobility comments: Patient moves from supine to sit without assistance.  Transfers Overall transfer level: Independent Equipment used: None             General transfer comment: Patient moves from sit to stand without assistance.  Ambulation/Gait Ambulation/Gait assistance: Independent Ambulation Distance (Feet): 190 Feet Assistive device: None Gait Pattern/deviations: WFL(Within Functional Limits)     General Gait Details: Patient ambulates without AD. Able to perform head turns with no LOB.  Stairs            Wheelchair Mobility    Modified Rankin (Stroke Patients Only)       Balance Overall balance assessment: Independent                                           Pertinent Vitals/Pain Pain Assessment: No/denies pain    Home Living Family/patient expects to be discharged to:: Private residence Living  Arrangements: Alone Available Help at Discharge: Family;Available PRN/intermittently Type of Home: House Home Access: Level entry     Home Layout: Two level Home Equipment: None      Prior Function Level of Independence: Independent               Hand Dominance        Extremity/Trunk Assessment   Upper Extremity Assessment: Overall WFL for tasks assessed (No deficits in sensory/myotomal, coordination deficits)           Lower Extremity Assessment: Overall WFL for tasks assessed         Communication   Communication: No difficulties  Cognition Arousal/Alertness: Awake/alert Behavior During Therapy: WFL for tasks assessed/performed Overall Cognitive Status: Within Functional Limits for tasks assessed                      General Comments      Exercises        Assessment/Plan    PT Assessment Patent does not need any further PT services  PT Diagnosis Other (comment) (Impaired muscles of CNVII)   PT Problem List    PT Treatment Interventions     PT Goals (Current goals can be found in the Care Plan section) Acute Rehab PT Goals Patient Stated Goal: "To go home." PT Goal Formulation: With patient Time For Goal Achievement: 07/16/15 Potential to Achieve Goals: Good    Frequency  Barriers to discharge        Co-evaluation               End of Session Equipment Utilized During Treatment: Gait belt Activity Tolerance: Patient tolerated treatment well Patient left: in bed;with family/visitor present Nurse Communication: Mobility status    Functional Assessment Tool Used: Clinical Judgement Functional Limitation: Mobility: Walking and moving around Mobility: Walking and Moving Around Current Status [id number]): 0 percent impaired, limited or restricted Mobility: Walking and Moving Around Goal Status [id number]): 0 percent impaired, limited or restricted    Time: 1120-1135 PT Time Calculation (min) (ACUTE ONLY): 15 min   Charges:    PT Evaluation $Initial PT Evaluation Tier I: 1 Procedure     PT G Codes:   PT G-Codes **NOT FOR INPATIENT CLASS** Functional Assessment Tool Used: Clinical Judgement Functional Limitation: Mobility: Walking and moving around Mobility: Walking and Moving Around Current Status [id number]): 0 percent impaired, limited or restricted Mobility: Walking and Moving Around Goal Status [id number]): 0 percent impaired, limited or restricted    Dorice Lamas, PT, DPT 07/02/2015, 12:20 PM

## 2015-07-02 NOTE — Plan of Care (Addendum)
Problem: Education: Goal: Knowledge of  General Education information/materials will improve Outcome: Progressing Education provided to patient and family about stroke, warning signs, worsening symptoms, fall risk (Moderate fall risk with bed alarm not activated), care plan, pain scale, and medications to be given. Pt and family verbalized understanding.   Problem: Safety: Goal: Ability to remain free from injury will improve Outcome: Progressing Pt is a moderate fall risk, scores 7. Pt noted with steady on admission. Pt verbalized understanding to call prior to attempting to get OOB. Pt remains free of fall or injury this shift.     Problem: Pain Managment: Goal: General experience of comfort will improve Outcome: Progressing Pt denies pain or discomfort. Continue to monitor.     Problem: Physical Regulation: Goal: Ability to maintain clinical measurements within normal limits will improve Outcome: Progressing NIH score 4, vitals stable on admission. Off unit telemetry reports NSR with PAC's hr 89. Continue to monitor.      Problem: Skin Integrity: Goal: Risk for impaired skin integrity will decrease Outcome: Progressing Pt is alert, oriented, and ambulatory. Pt is able to turn self in bed. Pt OOB ambulating to BR.     Problem: Bowel/Gastric: Goal: Will not experience complications related to bowel motility Outcome: Progressing Pt reports last regular BM was on 07/01/2015, prior to arrival to room 125. Continue to monitor.     Problem: Education: Goal: Knowledge of disease or condition will improve Outcome: Progressing Education provided pertaining to stroke diagnosis. Booklet given per order. Pt and family at bedside. All questions answered, understanding verbalized.    Goal: Knowledge of secondary prevention will improve Outcome: Progressing Pt is aware of htn, hyperlipidemia, and DM. Currently controlled with home medications.      Problem:  Nutrition: Goal: Risk of aspiration will decrease Outcome: Progressing Pt currently having no difficulty with swallowing. Education provided about aspiration. Pt verbalized understanding.   Problem: Tissue Perfusion: Goal: Cerebral tissue perfusion will improve Outcome: Progressing Head CT negative, vitals stable, oxygen 99% on room air. Continue to monitor.    Goal: Complications of Ischemic Stroke will be minimized. Outcome: Progressing Pt currently scoring at 4 on NIH, no changes noted this shift. Continue to monitor.

## 2015-07-03 NOTE — Progress Notes (Signed)
Speech Therapy Note: Received order, consulted NSG and met w/ pt/family. MD notes indicate likely Bell's Palsy. Discussed recommendations w/ pt to rest and hydrate; follow MD's medication instruction but that exercises were not rec'd for Bell's Palsy as it can fatigue the muscles/nerves during this time. Pt and family agreed. NSG updated. No further skilled ST services indicated at this time. Pt d/t discharge home today per NSG.

## 2016-03-19 ENCOUNTER — Ambulatory Visit: Payer: Medicare Other

## 2016-03-19 ENCOUNTER — Ambulatory Visit
Admission: EM | Admit: 2016-03-19 | Discharge: 2016-03-19 | Disposition: A | Discharge status: Home / Self Care | Payer: Medicare Other | Attending: Family Medicine | Admitting: Family Medicine

## 2016-03-19 ENCOUNTER — Encounter: Payer: Self-pay | Admitting: Emergency Medicine

## 2016-03-19 DIAGNOSIS — E079 Disorder of thyroid, unspecified: Secondary | ICD-10-CM | POA: Insufficient documentation

## 2016-03-19 DIAGNOSIS — Z885 Allergy status to narcotic agent status: Secondary | ICD-10-CM | POA: Insufficient documentation

## 2016-03-19 DIAGNOSIS — Z7984 Long term (current) use of oral hypoglycemic drugs: Secondary | ICD-10-CM | POA: Insufficient documentation

## 2016-03-19 DIAGNOSIS — J069 Acute upper respiratory infection, unspecified: Secondary | ICD-10-CM | POA: Insufficient documentation

## 2016-03-19 DIAGNOSIS — I10 Essential (primary) hypertension: Secondary | ICD-10-CM | POA: Insufficient documentation

## 2016-03-19 DIAGNOSIS — R05 Cough: Secondary | ICD-10-CM | POA: Insufficient documentation

## 2016-03-19 DIAGNOSIS — Z79899 Other long term (current) drug therapy: Secondary | ICD-10-CM | POA: Diagnosis not present

## 2016-03-19 DIAGNOSIS — R918 Other nonspecific abnormal finding of lung field: Secondary | ICD-10-CM | POA: Diagnosis not present

## 2016-03-19 DIAGNOSIS — R059 Cough, unspecified: Secondary | ICD-10-CM

## 2016-03-19 DIAGNOSIS — E785 Hyperlipidemia, unspecified: Secondary | ICD-10-CM | POA: Diagnosis not present

## 2016-03-19 DIAGNOSIS — E119 Type 2 diabetes mellitus without complications: Secondary | ICD-10-CM | POA: Diagnosis not present

## 2016-03-19 MED ORDER — BENZONATATE 200 MG PO CAPS: 200.0000 mg | ORAL_CAPSULE | Freq: Three times a day (TID) | ORAL | 0 refills | Status: DC

## 2016-03-19 NOTE — ED Triage Notes (Signed)
Patient c/o productive cough and chest congestion for 4 days.  Patient denies fevers.

## 2016-03-19 NOTE — ED Provider Notes (Signed)
CSN: [id number]     Arrival date & time 03/19/16  1455 History   First MD Initiated Contact with Patient 03/19/16 1510     Chief Complaint  Patient presents with  . Cough   (Consider location/radiation/quality/duration/timing/severity/associated sxs/prior Treatment) HPI   This a 80 year old female who presents with a history of fatigue not feeling well and a cough is productive of white  Sputum.. She denies any fever or chills. She has never been a smoker. She has noticed some wheezing. She has been babysitting her grandchildren all of which have had colds with coughs. She has had a upper right tooth that has been painful leading into her upper jaw and ear and then down into her neck. The tooth is not tender to the touch.  Past Medical History:  Diagnosis Date  . Diabetes mellitus without complication ([place])   . Hyperlipemia   . Hypertension   . Thyroid disease    Past Surgical History:  Procedure Laterality Date  . ESOPHAGEAL DILATION     Family History  Problem Relation Age of Onset  . Diabetes Other   . Heart Problems Mother   . Cancer Father    Social History  Substance Use Topics  . Smoking status: Never Smoker  . Smokeless tobacco: Never Used  . Alcohol use No   OB History    No data available     Review of Systems  Constitutional: Negative.  Negative for chills, fatigue and fever.  HENT: Positive for congestion, dental problem and postnasal drip.   Respiratory: Positive for cough and wheezing.   All other systems reviewed and are negative.   Allergies  Codeine  Home Medications   Prior to Admission medications   Medication Sig Start Date End Date Taking? Authorizing Provider  AZOPT 1 % ophthalmic suspension Place 1 drop into both eyes 2 (two) times daily. 04/11/15   Historical Provider, MD  benzonatate (TESSALON) 200 MG capsule Take 1 capsule (200 mg total) by mouth every 8 (eight) hours. 03/19/16   Lorin Picket, PA-C  brimonidine (ALPHAGAN) 0.15 %  ophthalmic solution Place 1 drop into both eyes 2 (two) times daily. 04/11/15   Historical Provider, MD  levothyroxine (SYNTHROID, LEVOTHROID) 88 MCG tablet Take 1 tablet by mouth daily. 06/05/15   Historical Provider, MD  LUMIGAN 0.01 % SOLN Place 1 drop into both eyes at bedtime. 04/11/15   Historical Provider, MD  metFORMIN (GLUCOPHAGE) 500 MG tablet Take 1 tablet by mouth 2 (two) times daily. 06/15/15   Historical Provider, MD  Multiple Vitamin (MULTIVITAMIN) tablet Take 1 tablet by mouth daily.    Historical Provider, MD  omeprazole (PRILOSEC) 20 MG capsule Take 1 capsule by mouth at bedtime. 04/13/15   Historical Provider, MD  simvastatin (ZOCOR) 20 MG tablet Take 1 tablet by mouth at bedtime. 06/15/15   Historical Provider, MD  timolol (TIMOPTIC) 0.5 % ophthalmic solution Place 1 drop into both eyes 2 (two) times daily. 04/11/15   Historical Provider, MD  valsartan-hydrochlorothiazide (DIOVAN-HCT) 320-25 MG tablet Take 1 tablet by mouth daily. 04/13/15   Historical Provider, MD   Meds Ordered and Administered this Visit  Medications - No data to display  BP 137/64 (BP Location: Left Arm)   Pulse 92   Temp 97.7 F (36.5 C) (Oral)   Resp 16   Ht 5\' 3"  (1.6 m)   Wt 156 lb (70.8 kg)   SpO2 97%   BMI 27.63 kg/m  No data found.   Physical Exam  Constitutional: She is oriented to person, place, and time. She appears well-developed and well-nourished. No distress.  HENT:  Head: Normocephalic and atraumatic.  Right Ear: External ear normal.  Left Ear: External ear normal.  Nose: Nose normal.  Mouth/Throat: Oropharynx is clear and moist. No oropharyngeal exudate.  Is no tenderness to percussion over the mandible or the upper jaw and teeth are not tender to percussion.  Eyes: Conjunctivae and EOM are normal. Pupils are equal, round, and reactive to light. Right eye exhibits no discharge. Left eye exhibits no discharge.  Neck: Normal range of motion. Neck supple.  Pulmonary/Chest: Effort  normal. No respiratory distress. She has wheezes. She has no rales.  Musculoskeletal: Normal range of motion. She exhibits no edema, tenderness or deformity.  Neurological: She is alert and oriented to person, place, and time.  Skin: Skin is warm and dry. She is not diaphoretic.  Psychiatric: She has a normal mood and affect. Her behavior is normal. Judgment and thought content normal.  Nursing note and vitals reviewed.   Urgent Care Course   Clinical Course    Procedures (including critical care time)  Labs Review Labs Reviewed - No data to display  Imaging Review Dg Chest 2 View  Result Date: 03/19/2016 CLINICAL DATA:  Productive cough, chest congestion for 4 days history of breast carcinoma EXAM: CHEST  2 VIEW COMPARISON:  None. FINDINGS: There is a rounded fairly well circumscribed nodular lesion at the right lung base, probably within the right lower lobe, measuring 20 mm in diameter. Comparison with any prior chest x-ray is recommended. If no chest x-rays exists, then CT of the chest is recommended to evaluate further. Otherwise the lungs are clear. No pleural effusion is seen. Mediastinal and hilar contours are unremarkable. The heart is within upper limits normal. There is a moderate size hiatal hernia present. No acute bony abnormality is seen. There may be kidney stones present on the right. IMPRESSION: 1. 20 mm rounded opacity probably in the right lower lobe. Although this may be benign, malignancy cannot be excluded and comparison with prior chest x-ray or consider CT of the chest to assess further. 2. Moderate size hiatal hernia. 3. Question right renal calculi. Electronically Signed   By: Ivar Drape M.D.   On: 03/19/2016 15:50     Visual Acuity Review  Right Eye Distance:   Left Eye Distance:   Bilateral Distance:    Right Eye Near:   Left Eye Near:    Bilateral Near:         MDM   1. Cough   2. Opacity of lung on imaging study   3. Acute upper respiratory  infection    Discharge Medication List as of 03/19/2016  4:32 PM    START taking these medications   Details  benzonatate (TESSALON) 200 MG capsule Take 1 capsule (200 mg total) by mouth every 8 (eight) hours., Starting Tue 03/19/2016, Normal      Plan: 1. Test/x-ray results and diagnosis reviewed with patient 2. rx as per orders; risks, benefits, potential side effects reviewed with patient 3. Recommend supportive treatment with increased fluids. I recommend she use Delsym cough syrup at nighttime. I've explained the opacity that she has a right lower lobe and because of her previous episode of breast cancer this is concerning. No other x-rays or CAT scans are available for comparison. Because of this she will need to have this followed up quickly with her primary care. If another x-ray for comparison could  be found and there is no increase in the size or its been there for a while then this is less of a concern. However she may require a CAT scan in order to better delineate and identified the lesion. I have provided her with a disc of her x-rays today to take with her to her primary care. 4. F/u prn if symptoms worsen or don't improve     Lorin Picket, PA-C 03/19/16 1641

## 2016-11-30 ENCOUNTER — Emergency Department
Admission: EM | Admit: 2016-11-30 | Discharge: 2016-11-30 | Disposition: A | Discharge status: Home / Self Care | Payer: Medicare Other | Attending: Emergency Medicine | Admitting: Emergency Medicine

## 2016-11-30 ENCOUNTER — Emergency Department: Payer: Medicare Other

## 2016-11-30 ENCOUNTER — Encounter: Payer: Self-pay | Admitting: Emergency Medicine

## 2016-11-30 DIAGNOSIS — S20212A Contusion of left front wall of thorax, initial encounter: Secondary | ICD-10-CM | POA: Insufficient documentation

## 2016-11-30 DIAGNOSIS — Y939 Activity, unspecified: Secondary | ICD-10-CM | POA: Diagnosis not present

## 2016-11-30 DIAGNOSIS — Y999 Unspecified external cause status: Secondary | ICD-10-CM | POA: Insufficient documentation

## 2016-11-30 DIAGNOSIS — W010XXA Fall on same level from slipping, tripping and stumbling without subsequent striking against object, initial encounter: Secondary | ICD-10-CM | POA: Insufficient documentation

## 2016-11-30 DIAGNOSIS — Z7984 Long term (current) use of oral hypoglycemic drugs: Secondary | ICD-10-CM | POA: Diagnosis not present

## 2016-11-30 DIAGNOSIS — E119 Type 2 diabetes mellitus without complications: Secondary | ICD-10-CM | POA: Diagnosis not present

## 2016-11-30 DIAGNOSIS — S299XXA Unspecified injury of thorax, initial encounter: Secondary | ICD-10-CM | POA: Diagnosis present

## 2016-11-30 DIAGNOSIS — I10 Essential (primary) hypertension: Secondary | ICD-10-CM | POA: Diagnosis not present

## 2016-11-30 DIAGNOSIS — Y929 Unspecified place or not applicable: Secondary | ICD-10-CM | POA: Diagnosis not present

## 2016-11-30 DIAGNOSIS — Z79899 Other long term (current) drug therapy: Secondary | ICD-10-CM | POA: Insufficient documentation

## 2016-11-30 MED ORDER — TRAMADOL HCL 50 MG PO TABS
50.0000 mg | ORAL_TABLET | Freq: Two times a day (BID) | ORAL | 0 refills | Status: DC | PRN
Start: 2016-11-30 — End: 2020-01-23

## 2016-11-30 NOTE — Discharge Instructions (Signed)
Advise to follow up with PCP to get consult to CT scan right lower lobe nodular lung lesion unchanged from previous x-ray in August 2017.

## 2016-11-30 NOTE — ED Provider Notes (Signed)
[facility] Emergency Department Provider Note   ____________________________________________   First MD Initiated Contact with Patient 11/30/16 1449     (approximate)  I have reviewed the triage vital signs and the nursing notes.   HISTORY  Chief Complaint Fall    HPI Heather Small is a 81 y.o. female Left upper anterior chest wall pain 2nd to fall 3 days ago. Pain increased with cough and deep inspiration. Patient rates pain as 8/10. Describes pain as "achy". No palliative measures for compliant.    Past Medical History:  Diagnosis Date  . Diabetes mellitus without complication ([place])   . Hyperlipemia   . Hypertension   . Thyroid disease     Patient Active Problem List   Diagnosis Date Noted  . Stroke [facility]) 07/01/2015    Past Surgical History:  Procedure Laterality Date  . ESOPHAGEAL DILATION      Prior to Admission medications   Medication Sig Start Date End Date Taking? Authorizing Provider  AZOPT 1 % ophthalmic suspension Place 1 drop into both eyes 2 (two) times daily. 04/11/15   Historical Provider, MD  benzonatate (TESSALON) 200 MG capsule Take 1 capsule (200 mg total) by mouth every 8 (eight) hours. 03/19/16   Lorin Picket, PA-C  brimonidine (ALPHAGAN) 0.15 % ophthalmic solution Place 1 drop into both eyes 2 (two) times daily. 04/11/15   Historical Provider, MD  levothyroxine (SYNTHROID, LEVOTHROID) 88 MCG tablet Take 1 tablet by mouth daily. 06/05/15   Historical Provider, MD  LUMIGAN 0.01 % SOLN Place 1 drop into both eyes at bedtime. 04/11/15   Historical Provider, MD  metFORMIN (GLUCOPHAGE) 500 MG tablet Take 1 tablet by mouth 2 (two) times daily. 06/15/15   Historical Provider, MD  Multiple Vitamin (MULTIVITAMIN) tablet Take 1 tablet by mouth daily.    Historical Provider, MD  omeprazole (PRILOSEC) 20 MG capsule Take 1 capsule by mouth at bedtime. 04/13/15   Historical Provider, MD  simvastatin (ZOCOR) 20 MG tablet Take 1 tablet by  mouth at bedtime. 06/15/15   Historical Provider, MD  timolol (TIMOPTIC) 0.5 % ophthalmic solution Place 1 drop into both eyes 2 (two) times daily. 04/11/15   Historical Provider, MD  traMADol (ULTRAM) 50 MG tablet Take 1 tablet (50 mg total) by mouth every 12 (twelve) hours as needed. 11/30/16   Sable Feil, PA-C  valsartan-hydrochlorothiazide (DIOVAN-HCT) 320-25 MG tablet Take 1 tablet by mouth daily. 04/13/15   Historical Provider, MD    Allergies Codeine  Family History  Problem Relation Age of Onset  . Diabetes Other   . Heart Problems Mother   . Cancer Father     Social History Social History  Substance Use Topics  . Smoking status: Never Smoker  . Smokeless tobacco: Never Used  . Alcohol use No    Review of Systems Constitutional: No fever/chills Eyes: No visual changes. ENT: No sore throat. Cardiovascular: Denies chest pain. Respiratory: Denies shortness of breath. Gastrointestinal: No abdominal pain.  No nausea, no vomiting.  No diarrhea.  No constipation. Genitourinary: Negative for dysuria. Musculoskeletal: Negative for back pain. Skin: Negative for rash. Neurological: Negative for headaches, focal weakness or numbness. Endocrine Diabetes, Hypertension, and Hypothyroidism: Hematological/Lymphatic: Allergic/Immunilogical: codeine  ____________________________________________   PHYSICAL EXAM:  VITAL SIGNS: ED Triage Vitals  Enc Vitals Group     BP 11/30/16 1346 (!) 183/97     Pulse Rate 11/30/16 1346 80     Resp 11/30/16 1346 16     Temp 11/30/16  1346 97.8 F (36.6 C)     Temp Source 11/30/16 1346 Oral     SpO2 11/30/16 1346 96 %     Weight 11/30/16 1347 153 lb (69.4 kg)     Height 11/30/16 1347 5\' 3"  (1.6 m)     Head Circumference --      Peak Flow --      Pain Score 11/30/16 1348 8     Pain Loc --      Pain Edu? --      Excl. in [place]? --     Constitutional: Alert and oriented. Well appearing and in no acute distress. Eyes: Conjunctivae are  normal. PERRL. EOMI. Head: Atraumatic. Nose: No congestion/rhinnorhea. Mouth/Throat: Mucous membranes are moist.  Oropharynx non-erythematous. Neck: No stridor.  No cervical spine tenderness to palpation. Hematological/Lymphatic/Immunilogical: No cervical lymphadenopathy. Cardiovascular: Normal rate, regular rhythm. Grossly normal heart sounds.  Good peripheral circulation. Respiratory: Normal respiratory effort.  No retractions. Lungs CTAB. Gastrointestinal: Soft and nontender. No distention. No abdominal bruits. No CVA tenderness. Musculoskeletal: No lower extremity tenderness nor edema.  No joint effusions. Neurologic:  Normal speech and language. No gross focal neurologic deficits are appreciated. No gait instability. Skin:  Skin is warm, dry and intact. No rash noted. Psychiatric: Mood and affect are normal. Speech and behavior are normal.  ____________________________________________   LABS (all labs ordered are listed, but only abnormal results are displayed)  Labs Reviewed - No data to display ____________________________________________  EKG   ____________________________________________  RADIOLOGY  No acute finding. Nodular lesion RLL unchanged from previous study. ____________________________________________   PROCEDURES  Procedure(s) performed: None  Procedures  Critical Care performed: No  ____________________________________________   INITIAL IMPRESSION / ASSESSMENT AND PLAN / ED COURSE  Pertinent labs & imaging results that were available during my care of the patient were reviewed by me and considered in my medical decision making (see chart for details).  Chest wall contusion. X-ray pending.      ____________________________________________   FINAL CLINICAL IMPRESSION(S) / ED DIAGNOSES  Discussed x-ray findings with patient. A"dvised to follow up with PCP for consult to CT Scan Lesion found in lung. Patient given discharge care  instructions.    NEW MEDICATIONS STARTED DURING THIS VISIT:  New Prescriptions   TRAMADOL (ULTRAM) 50 MG TABLET    Take 1 tablet (50 mg total) by mouth every 12 (twelve) hours as needed.     Note:  This document was prepared using Dragon voice recognition software and may include unintentional dictation errors.    Sable Feil, PA-C 11/30/16 [place], MD 12/01/16 [phone]

## 2016-11-30 NOTE — ED Triage Notes (Signed)
Pt to ed with c/o fall on Monday and Tuesday after tripping over dog.  Pt reports left side of body pain and soreness, worse with movement.

## 2017-02-17 ENCOUNTER — Emergency Department
Admission: EM | Admit: 2017-02-17 | Discharge: 2017-02-17 | Disposition: A | Discharge status: Home / Self Care | Payer: Medicare Other | Attending: Emergency Medicine | Admitting: Emergency Medicine

## 2017-02-17 ENCOUNTER — Emergency Department: Payer: Medicare Other

## 2017-02-17 ENCOUNTER — Encounter: Payer: Self-pay | Admitting: *Deleted

## 2017-02-17 DIAGNOSIS — R1033 Periumbilical pain: Secondary | ICD-10-CM | POA: Diagnosis not present

## 2017-02-17 DIAGNOSIS — R109 Unspecified abdominal pain: Secondary | ICD-10-CM | POA: Diagnosis present

## 2017-02-17 DIAGNOSIS — K449 Diaphragmatic hernia without obstruction or gangrene: Secondary | ICD-10-CM

## 2017-02-17 DIAGNOSIS — Z79899 Other long term (current) drug therapy: Secondary | ICD-10-CM | POA: Insufficient documentation

## 2017-02-17 DIAGNOSIS — E119 Type 2 diabetes mellitus without complications: Secondary | ICD-10-CM | POA: Insufficient documentation

## 2017-02-17 DIAGNOSIS — E079 Disorder of thyroid, unspecified: Secondary | ICD-10-CM | POA: Insufficient documentation

## 2017-02-17 DIAGNOSIS — R197 Diarrhea, unspecified: Secondary | ICD-10-CM | POA: Insufficient documentation

## 2017-02-17 DIAGNOSIS — I10 Essential (primary) hypertension: Secondary | ICD-10-CM | POA: Insufficient documentation

## 2017-02-17 DIAGNOSIS — Z7984 Long term (current) use of oral hypoglycemic drugs: Secondary | ICD-10-CM | POA: Diagnosis not present

## 2017-02-17 LAB — CBC WITH DIFFERENTIAL/PLATELET
BASOS ABS: 0.1 10*3/uL (ref 0–0.1)
BASOS PCT: 1 %
Eosinophils Absolute: 0.1 10*3/uL (ref 0–0.7)
Eosinophils Relative: 1 %
HEMATOCRIT: 37.3 % (ref 35.0–47.0)
Hemoglobin: 12 g/dL (ref 12.0–16.0)
LYMPHS PCT: 7 %
Lymphs Abs: 1.2 10*3/uL (ref 1.0–3.6)
MCH: 27.3 pg (ref 26.0–34.0)
MCHC: 32.3 g/dL (ref 32.0–36.0)
MCV: 84.8 fL (ref 80.0–100.0)
Monocytes Absolute: 0.9 10*3/uL (ref 0.2–0.9)
Monocytes Relative: 5 %
NEUTROS ABS: 13.9 10*3/uL — AB (ref 1.4–6.5)
Neutrophils Relative %: 86 %
PLATELETS: 323 10*3/uL (ref 150–440)
RBC: 4.4 MIL/uL (ref 3.80–5.20)
RDW: 13.3 % (ref 11.5–14.5)
WBC: 16.2 10*3/uL — AB (ref 3.6–11.0)

## 2017-02-17 LAB — COMPREHENSIVE METABOLIC PANEL
ALBUMIN: 3.8 g/dL (ref 3.5–5.0)
ALT: 13 U/L — AB (ref 14–54)
AST: 23 U/L (ref 15–41)
Alkaline Phosphatase: 60 U/L (ref 38–126)
Anion gap: 9 (ref 5–15)
BUN: 15 mg/dL (ref 6–20)
CHLORIDE: 98 mmol/L — AB (ref 101–111)
CO2: 28 mmol/L (ref 22–32)
CREATININE: 0.78 mg/dL (ref 0.44–1.00)
Calcium: 9.4 mg/dL (ref 8.9–10.3)
GFR calc Af Amer: >60 mL/min (ref >60)
GFR calc non Af Amer: >60 mL/min (ref >60)
GLUCOSE: 206 mg/dL — AB (ref 65–99)
Potassium: 3.4 mmol/L — ABNORMAL LOW (ref 3.5–5.1)
SODIUM: 135 mmol/L (ref 135–145)
Total Bilirubin: 0.5 mg/dL (ref 0.3–1.2)
Total Protein: 7.9 g/dL (ref 6.5–8.1)

## 2017-02-17 LAB — URINALYSIS, COMPLETE (UACMP) WITH MICROSCOPIC
BACTERIA UA: NONE SEEN
Bilirubin Urine: NEGATIVE
Glucose, UA: NEGATIVE mg/dL
Hgb urine dipstick: NEGATIVE
KETONES UR: NEGATIVE mg/dL
Leukocytes, UA: NEGATIVE
Nitrite: NEGATIVE
PROTEIN: NEGATIVE mg/dL
RBC / HPF: NONE SEEN RBC/hpf (ref 0–5)
Specific Gravity, Urine: 1.012 (ref 1.005–1.030)
WBC UA: NONE SEEN WBC/hpf (ref 0–5)
pH: 6 (ref 5.0–8.0)

## 2017-02-17 LAB — LACTIC ACID, PLASMA: Lactic Acid, Venous: 2.3 mmol/L (ref 0.5–1.9)

## 2017-02-17 LAB — TROPONIN I

## 2017-02-17 LAB — LIPASE, BLOOD: LIPASE: 33 U/L (ref 11–51)

## 2017-02-17 MED ORDER — ONDANSETRON HCL 4 MG/2ML IJ SOLN
4.0000 mg | Freq: Once | INTRAMUSCULAR | Status: DC
  Filled 2017-02-17: qty 2

## 2017-02-17 MED ORDER — SODIUM CHLORIDE 0.9 % IV BOLUS (SEPSIS): 1000.0000 mL | Freq: Once | INTRAVENOUS | Status: DC

## 2017-02-17 MED ORDER — MORPHINE SULFATE (PF) 2 MG/ML IV SOLN
2.0000 mg | Freq: Once | INTRAVENOUS | Status: DC
  Filled 2017-02-17: qty 1

## 2017-02-17 MED ORDER — SODIUM CHLORIDE 0.9 % IV BOLUS (SEPSIS)
1000.0000 mL | Freq: Once | INTRAVENOUS | Status: AC
  Administered 2017-02-17: 1000 mL via INTRAVENOUS

## 2017-02-17 MED ORDER — IOPAMIDOL (ISOVUE-300) INJECTION 61%
100.0000 mL | Freq: Once | INTRAVENOUS | Status: AC | PRN
  Administered 2017-02-17: 100 mL via INTRAVENOUS

## 2017-02-17 NOTE — ED Provider Notes (Signed)
The patient remains tachycardic to 1 teens. She has no pain and was able to drink without difficulty. I had a lengthy discussion with the patient and her daughter and they recommended they be admitted to the hospital for observation. They declined stating they would prefer to go home and come back first thing in the morning for a recheck her vital signs. I think this is actually reasonable and she may be slightly dehydrated and have viral syndrome as opposed to a true bacterial infection. I'll defer antibiotics at this time. She is discharged home in good condition.   Darel Hong, MD 02/17/17 2245

## 2017-02-17 NOTE — Discharge Instructions (Signed)
Please come back to our emergency Department first thing in the morning for recheck of your vital signs in your stomach. Return to the emergency department sooner for any new or worsening symptoms such as worsening pain, if you cannot eat or drink, or for any other concerns.  It was a pleasure to take care of you today, and thank you for coming to our emergency department.  If you have any questions or concerns before leaving please ask the nurse to grab me and I'm more than happy to go through your aftercare instructions again.  If you were prescribed any opioid pain medication today such as Norco, Vicodin, Percocet, morphine, hydrocodone, or oxycodone please make sure you do not drive when you are taking this medication as it can alter your ability to drive safely.  If you have any concerns once you are home that you are not improving or are in fact getting worse before you can make it to your follow-up appointment, please do not hesitate to call 911 and come back for further evaluation.  Darel Hong MD  Results for orders placed or performed during the hospital encounter of 02/17/17  Lipase, blood  Result Value Ref Range   Lipase 33 11 - 51 U/L  Comprehensive metabolic panel  Result Value Ref Range   Sodium 135 135 - 145 mmol/L   Potassium 3.4 (L) 3.5 - 5.1 mmol/L   Chloride 98 (L) 101 - 111 mmol/L   CO2 28 22 - 32 mmol/L   Glucose, Bld 206 (H) 65 - 99 mg/dL   BUN 15 6 - 20 mg/dL   Creatinine, Ser 0.78 0.44 - 1.00 mg/dL   Calcium 9.4 8.9 - 10.3 mg/dL   Total Protein 7.9 6.5 - 8.1 g/dL   Albumin 3.8 3.5 - 5.0 g/dL   AST 23 15 - 41 U/L   ALT 13 (L) 14 - 54 U/L   Alkaline Phosphatase 60 38 - 126 U/L   Total Bilirubin 0.5 0.3 - 1.2 mg/dL   GFR calc non Af Amer >60 >60 mL/min   GFR calc Af Amer >60 >60 mL/min   Anion gap 9 5 - 15  Urinalysis, Complete w Microscopic  Result Value Ref Range   Color, Urine STRAW (A) YELLOW   APPearance CLEAR (A) CLEAR   Specific Gravity, Urine  1.012 1.005 - 1.030   pH 6.0 5.0 - 8.0   Glucose, UA NEGATIVE NEGATIVE mg/dL   Hgb urine dipstick NEGATIVE NEGATIVE   Bilirubin Urine NEGATIVE NEGATIVE   Ketones, ur NEGATIVE NEGATIVE mg/dL   Protein, ur NEGATIVE NEGATIVE mg/dL   Nitrite NEGATIVE NEGATIVE   Leukocytes, UA NEGATIVE NEGATIVE   RBC / HPF NONE SEEN 0 - 5 RBC/hpf   WBC, UA NONE SEEN 0 - 5 WBC/hpf   Bacteria, UA NONE SEEN NONE SEEN   Squamous Epithelial / LPF 0-5 (A) NONE SEEN   Mucous PRESENT   CBC with Differential/Platelet  Result Value Ref Range   WBC 16.2 (H) 3.6 - 11.0 K/uL   RBC 4.40 3.80 - 5.20 MIL/uL   Hemoglobin 12.0 12.0 - 16.0 g/dL   HCT 37.3 35.0 - 47.0 %   MCV 84.8 80.0 - 100.0 fL   MCH 27.3 26.0 - 34.0 pg   MCHC 32.3 32.0 - 36.0 g/dL   RDW 13.3 11.5 - 14.5 %   Platelets 323 150 - 440 K/uL   Neutrophils Relative % 86 %   Neutro Abs 13.9 (H) 1.4 - 6.5 K/uL  Lymphocytes Relative 7 %   Lymphs Abs 1.2 1.0 - 3.6 K/uL   Monocytes Relative 5 %   Monocytes Absolute 0.9 0.2 - 0.9 K/uL   Eosinophils Relative 1 %   Eosinophils Absolute 0.1 0 - 0.7 K/uL   Basophils Relative 1 %   Basophils Absolute 0.1 0 - 0.1 K/uL  Lactic acid, plasma  Result Value Ref Range   Lactic Acid, Venous 2.3 (HH) 0.5 - 1.9 mmol/L  Troponin I  Result Value Ref Range   Troponin I <0.03 <0.03 ng/mL   Ct Abdomen Pelvis W Contrast  Result Date: 02/17/2017 CLINICAL DATA:  81 y/o F; periumbilical abdominal pain, fever, tachycardia. EXAM: CT ABDOMEN AND PELVIS WITH CONTRAST TECHNIQUE: Multidetector CT imaging of the abdomen and pelvis was performed using the standard protocol following bolus administration of intravenous contrast. CONTRAST:  192mL ISOVUE-300 IOPAMIDOL (ISOVUE-300) INJECTION 61% COMPARISON:  None. FINDINGS: Lower chest: Large hiatal hernia. Hepatobiliary: No focal liver lesion. Cholelithiasis. No intra or extrahepatic biliary ductal dilatation. Pancreas: Unremarkable. No pancreatic ductal dilatation or surrounding  inflammatory changes. Spleen: Normal in size without focal abnormality. Adrenals/Urinary Tract: Normal adrenal glands. Multiple renal cysts are present bilaterally the largest in the right upper pole measuring 23 mm. There are several calcifications in the right kidney compatible with stones measuring up to 8 mm. No hydronephrosis. Normal bladder. Stomach/Bowel: Stomach is within normal limits. Appendix appears normal. No evidence of bowel wall thickening, distention, or inflammatory changes. Scattered sigmoid diverticulosis. Vascular/Lymphatic: Aortic atherosclerosis with extensive calcification. No enlarged abdominal or pelvic lymph nodes. Reproductive: Numerous uterine myoma the largest in the lower body measuring up to 7 cm. Normal adnexa. Other: No abdominal wall hernia or abnormality. No abdominopelvic ascites. Musculoskeletal: No acute osseous abnormality is evident. Mild osteoarthrosis of the hip joints. Lower lumbar spine spondylosis with moderate loss of disc space height at L4-5 and L5-S1. IMPRESSION: 1. Large hiatal hernia. 2. Cholelithiasis. 3. Multiple bilateral renal cysts and right-sided renal stones. 4. Scattered sigmoid diverticulosis without evidence for diverticulitis. 5. 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 Electronically Signed   By: Kristine Garbe M.D.   On: 02/17/2017 20:51   US Abdomen Limited Ruq  Result Date: 02/17/2017 CLINICAL DATA:  Periumbilical pain for 3 days. History of hypertension and diabetes. EXAM: ULTRASOUND ABDOMEN LIMITED RIGHT UPPER QUADRANT COMPARISON:  CT abdomen and pelvis February 17, 2017 at 2036 hours FINDINGS: Gallbladder: Multiple echogenic gallstones measuring to 4 mm scans sludge. No gallbladder wall thickening or pericholecystic fluid. No sonographic Murphy's sign elicited. Common bile duct: Diameter: 5 mm Liver: Diffusely mildly increased echogenicity of the intrahepatic biliary dilatation. Hepatopetal portal vein. IMPRESSION: Cholelithiasis and  sludge without sonographic findings of acute cholecystitis. Electronically Signed   By: Elon Alas M.D.   On: 02/17/2017 21:41

## 2017-02-17 NOTE — ED Triage Notes (Signed)
Pt complains of lower abdominal pain below the umbilicus starting today with two episodes of diarrhea, pt reports chills today, pt took 1 aleve earlier and reports abdominal pain is gone

## 2017-02-17 NOTE — ED Provider Notes (Signed)
[facility] Emergency Department Provider Note  ____________________________________________  Time seen: Approximately 8:12 PM  I have reviewed the triage vital signs and the nursing notes.   HISTORY  Chief Complaint Abdominal Pain   HPI Heather Small is a 81 y.o. female history of diabetes, hypertension, hyperlipidemia, esophagitis, and hypothyroidism who presents for evaluation of abdominal pain. Patient reports that her pain started 2 days ago located in the periumbilical region, dull, moderate, and nonradiating. The pain lasted for 2 days and resolved. This morning patient reports she had 2 episodes of diarrhea and the pain recurred with the same intensity and location. She reports that she started having severe chills and was shaking uncontrollably. She took an Aleve at 6 PM and call her daughter who brought her to the emergency room. No further episodes of diarrhea, no nausea or vomiting however ever patient has had decreased appetite today. No fever, no dysuria or hematuria. Patient has never had abdominal surgery in the past. She reports that after the Aleve and her pain is now 0.No CP or SOB, no cough.  Past Medical History:  Diagnosis Date  . Diabetes mellitus without complication ([place])   . Hyperlipemia   . Hypertension   . Thyroid disease     Patient Active Problem List   Diagnosis Date Noted  . Sepsis ([place]) 02/18/2017  . Stroke [facility]) 07/01/2015    Past Surgical History:  Procedure Laterality Date  . ESOPHAGEAL DILATION      Prior to Admission medications   Medication Sig Start Date End Date Taking? Authorizing Provider  AZOPT 1 % ophthalmic suspension Place 1 drop into both eyes 2 (two) times daily. 04/11/15   [provider]  benzonatate (TESSALON) 200 MG capsule Take 1 capsule (200 mg total) by mouth every 8 (eight) hours. 03/19/16   Lorin Picket, PA-C  brimonidine (ALPHAGAN) 0.15 % ophthalmic solution Place 1 drop into both  eyes 2 (two) times daily. 04/11/15   [provider]  cephALEXin (KEFLEX) 500 MG capsule Take 1 capsule (500 mg total) by mouth 2 (two) times daily. 02/19/17   Fritzi Mandes, MD  levothyroxine (SYNTHROID, LEVOTHROID) 88 MCG tablet Take 1 tablet by mouth daily. 06/05/15   [provider]  LUMIGAN 0.01 % SOLN Place 1 drop into both eyes at bedtime. 04/11/15   [provider]  metFORMIN (GLUCOPHAGE) 500 MG tablet Take 1 tablet by mouth 2 (two) times daily. 06/15/15   [provider]  Multiple Vitamin (MULTIVITAMIN) tablet Take 1 tablet by mouth daily.    [provider]  omeprazole (PRILOSEC) 20 MG capsule Take 1 capsule by mouth at bedtime. 04/13/15   [provider]  simvastatin (ZOCOR) 20 MG tablet Take 1 tablet by mouth at bedtime. 06/15/15   [provider]  timolol (TIMOPTIC) 0.5 % ophthalmic solution Place 1 drop into both eyes 2 (two) times daily. 04/11/15   [provider]  traMADol (ULTRAM) 50 MG tablet Take 1 tablet (50 mg total) by mouth every 12 (twelve) hours as needed. 11/30/16   Sable Feil, PA-C  valsartan-hydrochlorothiazide (DIOVAN-HCT) 320-25 MG tablet Take 1 tablet by mouth daily. 04/13/15   [provider]    Allergies Codeine  Family History  Problem Relation Age of Onset  . Diabetes Other   . Heart Problems Mother   . Cancer Father     Social History Social History  Substance Use Topics  . Smoking status: Never Smoker  . Smokeless tobacco: Never  Used  . Alcohol use No    Review of Systems  Constitutional: Negative for fever. + chills Eyes: Negative for visual changes. ENT: Negative for sore throat. Neck: No neck pain  Cardiovascular: Negative for chest pain. Respiratory: Negative for shortness of breath. Gastrointestinal: + periumbilical abdominal pain, anorexia, diarrhea. No vomiting  Genitourinary: Negative for dysuria. Musculoskeletal: Negative for back pain. Skin: Negative for  rash. Neurological: Negative for headaches, weakness or numbness. Psych: No SI or HI  ____________________________________________   PHYSICAL EXAM:  VITAL SIGNS: ED Triage Vitals  Enc Vitals Group     BP 02/17/17 1920 (!) 171/75     Pulse Rate 02/17/17 1920 (!) 131     Resp 02/17/17 1920 20     Temp 02/17/17 1920 99 F (37.2 C)     Temp Source 02/17/17 1920 Oral     SpO2 02/17/17 1920 95 %     Weight 02/17/17 1921 145 lb (65.8 kg)     Height 02/17/17 1921 5\' 3"  (1.6 m)     Head Circumference --      Peak Flow --      Pain Score --      Pain Loc --      Pain Edu? --      Excl. in [place]? --     Constitutional: Alert and oriented. Well appearing and in no apparent distress. HEENT:      Head: Normocephalic and atraumatic.         Eyes: Conjunctivae are normal. Sclera is non-icteric.       Mouth/Throat: Mucous membranes are moist.       Neck: Supple with no signs of meningismus. Cardiovascular: Tachycardic with regular rhythm. No murmurs, gallops, or rubs. 2+ symmetrical distal pulses are present in all extremities. No JVD. Respiratory: Normal respiratory effort. Lungs are clear to auscultation bilaterally. No wheezes, crackles, or rhonchi.  Gastrointestinal: Soft, non tender, and non distended with positive bowel sounds. No rebound or guarding. Genitourinary: No CVA tenderness. Musculoskeletal: Nontender with normal range of motion in all extremities. No edema, cyanosis, or erythema of extremities. Neurologic: Normal speech and language. Face is symmetric. Moving all extremities. No gross focal neurologic deficits are appreciated. Skin: Skin is warm, dry and intact. No rash noted. Psychiatric: Mood and affect are normal. Speech and behavior are normal.  ____________________________________________   LABS (all labs ordered are listed, but only abnormal results are displayed)  Labs Reviewed  COMPREHENSIVE METABOLIC PANEL - Abnormal; Notable for the following:       Result  Value   Potassium 3.4 (*)    Chloride 98 (*)    Glucose, Bld 206 (*)    ALT 13 (*)    All other components within normal limits  URINALYSIS, COMPLETE (UACMP) WITH MICROSCOPIC - Abnormal; Notable for the following:    Color, Urine STRAW (*)    APPearance CLEAR (*)    Squamous Epithelial / LPF 0-5 (*)    All other components within normal limits  CBC WITH DIFFERENTIAL/PLATELET - Abnormal; Notable for the following:    WBC 16.2 (*)    Neutro Abs 13.9 (*)    All other components within normal limits  LACTIC ACID, PLASMA - Abnormal; Notable for the following:    Lactic Acid, Venous 2.3 (*)    All other components within normal limits  LIPASE, BLOOD  TROPONIN I   ____________________________________________  EKG  ED ECG REPORT I, Rudene Re, the attending physician, personally viewed and interpreted this ECG.  Sinus tachycardia, rate of 130, 1st degree AV block, LVH, normal QTc and QRS intervals, no STE or depression ____________________________________________  RADIOLOGY  CT a/p: PND ____________________________________________   PROCEDURES  Procedure(s) performed: None Procedures Critical Care performed:  None ____________________________________________   INITIAL IMPRESSION / ASSESSMENT AND PLAN / ED COURSE  81 y.o. female history of diabetes, hypertension, hyperlipidemia, esophagitis, and hypothyroidism who presents for evaluation of periumbilical intermittent abdominal pain for 2 days, chills, diarrhea, and anorexia. Patient is well-appearing, in no distress, she is tachycardic with heart rate of 131 and has a low-grade temp of 99 (patient took Aleve 2 hours prior to arrival). No hypotension. Abdomen at this time is soft with no tenderness throughout however due to her systemic symptoms and vital signs patient will be taken for CT scan to rule out appendicitis versus diverticulitis vs colitis. Will give IVF, check labs and UA.  Clinical Course as of Feb 21 1507    Mon Feb 17, 2017  2039 Labs show a leukocytosis with white count of 16 and elevated lactate of 2.3. Patient is currently on CT showing cholelithiasis and no other acute findings. Due to patient's fever, tachycardia, elevated WBC and lactic acid without a potential source of infection will send patient to [place] to rule out cholecystitis. Care transferred to Dr. Mable Paris  [CV]    Clinical Course User Index [CV] Alfred Levins [place], MD    Pertinent labs & imaging results that were available during my care of the patient were reviewed by me and considered in my medical decision making (see chart for details).    ____________________________________________   FINAL CLINICAL IMPRESSION(S) / ED DIAGNOSES  Final diagnoses:  Periumbilical abdominal pain  Hiatal hernia  Diarrhea, unspecified type      NEW MEDICATIONS STARTED DURING THIS VISIT:  Discharge Medication List as of 02/17/2017 10:46 PM       Note:  This document was prepared using Dragon voice recognition software and may include unintentional dictation errors.    Alfred Levins, [place], MD 02/20/17 [phone]

## 2017-02-17 NOTE — ED Provider Notes (Signed)
Care signed over from Dr. Alfred Levins pending results of ultrasound. Briefly the patient is a 81 year old woman who comes to the emergency department with several days of abdominal pain although is in no pain currently. She came to the emergency department for a brief episode of shaking chills and she felt like she might have a fever. Dr. Alfred Levins obtained a CT scan that does not show any acute surgical issues but did show cholelithiasis. She ordered an ultrasound to evaluate for cholecystitis. Ultrasound does confirm gallstones, but has no signs of cholecystitis. I discussed the case with on-call general surgeon Dr. Hampton Abbot who reviewed the imaging and felt that the patient's symptoms were not related to an acute surgical issue. She does have a very large hiatal hernia and he recommended follow up in clinic with Dr. Adonis Huguenin for reevaluation.  Again the patient is currently in no pain whatsoever. She is asking for water and has a completely benign abdominal exam. Her heart rate is improving with fluids. Urinalysis is pending and disposition is pending the results.   Darel Hong, MD 02/17/17 2216

## 2017-02-18 ENCOUNTER — Encounter: Payer: Self-pay | Admitting: *Deleted

## 2017-02-18 ENCOUNTER — Observation Stay
Admission: EM | Admit: 2017-02-18 | Discharge: 2017-02-19 | Disposition: A | Discharge status: Home / Self Care | Payer: Medicare Other | Attending: Internal Medicine | Admitting: Internal Medicine

## 2017-02-18 ENCOUNTER — Emergency Department: Payer: Medicare Other

## 2017-02-18 DIAGNOSIS — E785 Hyperlipidemia, unspecified: Secondary | ICD-10-CM | POA: Insufficient documentation

## 2017-02-18 DIAGNOSIS — E119 Type 2 diabetes mellitus without complications: Secondary | ICD-10-CM | POA: Insufficient documentation

## 2017-02-18 DIAGNOSIS — R6510 Systemic inflammatory response syndrome (SIRS) of non-infectious origin without acute organ dysfunction: Secondary | ICD-10-CM | POA: Diagnosis present

## 2017-02-18 DIAGNOSIS — K449 Diaphragmatic hernia without obstruction or gangrene: Secondary | ICD-10-CM | POA: Insufficient documentation

## 2017-02-18 DIAGNOSIS — Z794 Long term (current) use of insulin: Secondary | ICD-10-CM | POA: Insufficient documentation

## 2017-02-18 DIAGNOSIS — N390 Urinary tract infection, site not specified: Secondary | ICD-10-CM | POA: Insufficient documentation

## 2017-02-18 DIAGNOSIS — E039 Hypothyroidism, unspecified: Secondary | ICD-10-CM | POA: Insufficient documentation

## 2017-02-18 DIAGNOSIS — Z79899 Other long term (current) drug therapy: Secondary | ICD-10-CM | POA: Diagnosis not present

## 2017-02-18 DIAGNOSIS — E876 Hypokalemia: Secondary | ICD-10-CM | POA: Diagnosis not present

## 2017-02-18 DIAGNOSIS — I10 Essential (primary) hypertension: Secondary | ICD-10-CM | POA: Diagnosis not present

## 2017-02-18 DIAGNOSIS — A419 Sepsis, unspecified organism: Secondary | ICD-10-CM | POA: Diagnosis not present

## 2017-02-18 LAB — URINALYSIS, COMPLETE (UACMP) WITH MICROSCOPIC
BILIRUBIN URINE: NEGATIVE
Bacteria, UA: NONE SEEN
Glucose, UA: NEGATIVE mg/dL
Hgb urine dipstick: NEGATIVE
Ketones, ur: NEGATIVE mg/dL
LEUKOCYTES UA: NEGATIVE
Nitrite: NEGATIVE
Protein, ur: NEGATIVE mg/dL
SPECIFIC GRAVITY, URINE: 1.011 (ref 1.005–1.030)
pH: 7 (ref 5.0–8.0)

## 2017-02-18 LAB — TSH: TSH: 0.961 u[IU]/mL (ref 0.350–4.500)

## 2017-02-18 LAB — COMPREHENSIVE METABOLIC PANEL
ALT: 14 U/L (ref 14–54)
AST: 31 U/L (ref 15–41)
Albumin: 3.4 g/dL — ABNORMAL LOW (ref 3.5–5.0)
Alkaline Phosphatase: 59 U/L (ref 38–126)
Anion gap: 11 (ref 5–15)
BUN: 13 mg/dL (ref 6–20)
CHLORIDE: 101 mmol/L (ref 101–111)
CO2: 24 mmol/L (ref 22–32)
CREATININE: 0.97 mg/dL (ref 0.44–1.00)
Calcium: 9.1 mg/dL (ref 8.9–10.3)
GFR, EST NON AFRICAN AMERICAN: 55 mL/min — AB (ref >60)
Glucose, Bld: 277 mg/dL — ABNORMAL HIGH (ref 65–99)
Potassium: 3.2 mmol/L — ABNORMAL LOW (ref 3.5–5.1)
Sodium: 136 mmol/L (ref 135–145)
TOTAL PROTEIN: 7.1 g/dL (ref 6.5–8.1)
Total Bilirubin: 0.6 mg/dL (ref 0.3–1.2)

## 2017-02-18 LAB — CBC
HCT: 33.9 % — ABNORMAL LOW (ref 35.0–47.0)
Hemoglobin: 11.2 g/dL — ABNORMAL LOW (ref 12.0–16.0)
MCH: 27.6 pg (ref 26.0–34.0)
MCHC: 33 g/dL (ref 32.0–36.0)
MCV: 83.4 fL (ref 80.0–100.0)
PLATELETS: 292 10*3/uL (ref 150–440)
RBC: 4.07 MIL/uL (ref 3.80–5.20)
RDW: 13.3 % (ref 11.5–14.5)
WBC: 24.2 10*3/uL — ABNORMAL HIGH (ref 3.6–11.0)

## 2017-02-18 LAB — GLUCOSE, CAPILLARY
GLUCOSE-CAPILLARY: 272 mg/dL — AB (ref 65–99)
GLUCOSE-CAPILLARY: 109 mg/dL — AB (ref 65–99)
GLUCOSE-CAPILLARY: 141 mg/dL — AB (ref 65–99)

## 2017-02-18 LAB — LIPASE, BLOOD: LIPASE: 22 U/L (ref 11–51)

## 2017-02-18 LAB — LACTIC ACID, PLASMA: Lactic Acid, Venous: 1.5 mmol/L (ref 0.5–1.9)

## 2017-02-18 LAB — PROCALCITONIN: PROCALCITONIN: 1.18 ng/mL

## 2017-02-18 MED ORDER — PANTOPRAZOLE SODIUM 40 MG PO TBEC
40.0000 mg | DELAYED_RELEASE_TABLET | Freq: Every day | ORAL | Status: DC
  Administered 2017-02-19 (×2): 40 mg via ORAL
  Filled 2017-02-18 (×2): qty 1

## 2017-02-18 MED ORDER — METFORMIN HCL 500 MG PO TABS
500.0000 mg | ORAL_TABLET | Freq: Two times a day (BID) | ORAL | Status: DC
  Administered 2017-02-18 – 2017-02-19 (×2): 500 mg via ORAL
  Filled 2017-02-18 (×2): qty 1

## 2017-02-18 MED ORDER — ONDANSETRON HCL 4 MG PO TABS: 4.0000 mg | ORAL_TABLET | Freq: Four times a day (QID) | ORAL | Status: DC | PRN

## 2017-02-18 MED ORDER — INSULIN ASPART 100 UNIT/ML ~~LOC~~ SOLN
0.0000 [IU] | Freq: Three times a day (TID) | SUBCUTANEOUS | Status: DC
  Administered 2017-02-19: 3 [IU] via SUBCUTANEOUS
  Administered 2017-02-19: 1 [IU] via SUBCUTANEOUS
  Filled 2017-02-18 (×2): qty 1

## 2017-02-18 MED ORDER — VANCOMYCIN HCL IN DEXTROSE 1-5 GM/200ML-% IV SOLN
1000.0000 mg | INTRAVENOUS | Status: DC
  Administered 2017-02-19: 1000 mg via INTRAVENOUS
  Filled 2017-02-18 (×2): qty 200

## 2017-02-18 MED ORDER — ADULT MULTIVITAMIN W/MINERALS CH
1.0000 | ORAL_TABLET | Freq: Every day | ORAL | Status: DC
Start: 2017-02-19 — End: 2017-02-19
  Administered 2017-02-19: 1 via ORAL
  Filled 2017-02-18: qty 1

## 2017-02-18 MED ORDER — LEVOTHYROXINE SODIUM 88 MCG PO TABS
88.0000 ug | ORAL_TABLET | Freq: Every day | ORAL | Status: DC
  Administered 2017-02-19: 88 ug via ORAL
  Filled 2017-02-18: qty 1

## 2017-02-18 MED ORDER — SIMVASTATIN 20 MG PO TABS
20.0000 mg | ORAL_TABLET | Freq: Every day | ORAL | Status: DC
  Administered 2017-02-18: 20 mg via ORAL
  Filled 2017-02-18 (×2): qty 1

## 2017-02-18 MED ORDER — SODIUM CHLORIDE 0.9 % IV SOLN
INTRAVENOUS | Status: DC
  Administered 2017-02-18: 1000 mL via INTRAVENOUS
  Administered 2017-02-19: 06:00:00 via INTRAVENOUS

## 2017-02-18 MED ORDER — PILOCARPINE HCL 4 % OP SOLN
1.0000 [drp] | Freq: Three times a day (TID) | OPHTHALMIC | Status: DC
  Administered 2017-02-18 – 2017-02-19 (×2): 1 [drp] via OPHTHALMIC
  Filled 2017-02-18: qty 15

## 2017-02-18 MED ORDER — ACETAMINOPHEN 650 MG RE SUPP: 650.0000 mg | Freq: Four times a day (QID) | RECTAL | Status: DC | PRN

## 2017-02-18 MED ORDER — ENOXAPARIN SODIUM 40 MG/0.4ML ~~LOC~~ SOLN: 40.0000 mg | SUBCUTANEOUS | Status: DC

## 2017-02-18 MED ORDER — ACETAMINOPHEN 325 MG PO TABS: 650.0000 mg | ORAL_TABLET | Freq: Four times a day (QID) | ORAL | Status: DC | PRN

## 2017-02-18 MED ORDER — LATANOPROST 0.005 % OP SOLN
1.0000 [drp] | Freq: Every day | OPHTHALMIC | Status: DC
  Administered 2017-02-18: 1 [drp] via OPHTHALMIC
  Filled 2017-02-18: qty 2.5

## 2017-02-18 MED ORDER — BRIMONIDINE TARTRATE 0.15 % OP SOLN
1.0000 [drp] | Freq: Two times a day (BID) | OPHTHALMIC | Status: DC
  Administered 2017-02-18 – 2017-02-19 (×2): 1 [drp] via OPHTHALMIC
  Filled 2017-02-18: qty 5

## 2017-02-18 MED ORDER — SODIUM CHLORIDE 0.9 % IV SOLN
1000.0000 mL | Freq: Once | INTRAVENOUS | Status: AC
  Administered 2017-02-18: 1000 mL via INTRAVENOUS

## 2017-02-18 MED ORDER — TIMOLOL MALEATE 0.5 % OP SOLN
1.0000 [drp] | Freq: Two times a day (BID) | OPHTHALMIC | Status: DC
  Administered 2017-02-18 – 2017-02-19 (×2): 1 [drp] via OPHTHALMIC
  Filled 2017-02-18: qty 5

## 2017-02-18 MED ORDER — VANCOMYCIN HCL IN DEXTROSE 1-5 GM/200ML-% IV SOLN
1000.0000 mg | Freq: Once | INTRAVENOUS | Status: AC
  Administered 2017-02-18: 1000 mg via INTRAVENOUS
  Filled 2017-02-18: qty 200

## 2017-02-18 MED ORDER — BRINZOLAMIDE 1 % OP SUSP
1.0000 [drp] | Freq: Two times a day (BID) | OPHTHALMIC | Status: DC
  Administered 2017-02-18 – 2017-02-19 (×2): 1 [drp] via OPHTHALMIC
  Filled 2017-02-18: qty 10

## 2017-02-18 MED ORDER — DEXTROSE 5 % IV SOLN
2.0000 g | INTRAVENOUS | Status: DC
  Administered 2017-02-18: 2 g via INTRAVENOUS
  Filled 2017-02-18 (×2): qty 2

## 2017-02-18 MED ORDER — ONDANSETRON HCL 4 MG/2ML IJ SOLN: 4.0000 mg | Freq: Four times a day (QID) | INTRAMUSCULAR | Status: DC | PRN

## 2017-02-18 MED ORDER — POTASSIUM CHLORIDE CRYS ER 20 MEQ PO TBCR
40.0000 meq | EXTENDED_RELEASE_TABLET | Freq: Once | ORAL | Status: AC
Start: 2017-02-18 — End: 2017-02-18
  Administered 2017-02-18: 40 meq via ORAL
  Filled 2017-02-18: qty 2

## 2017-02-18 MED ORDER — INSULIN ASPART 100 UNIT/ML ~~LOC~~ SOLN: 0.0000 [IU] | Freq: Every day | SUBCUTANEOUS | Status: DC

## 2017-02-18 NOTE — ED Triage Notes (Signed)
Pt states she was seen in Ed yesterday for abd pain and shaking, states her HR was high and her CBG was in the 200s and she was told to come back today for a recheck, today denies any pain, states feeling better, awake and alert

## 2017-02-18 NOTE — H&P (Addendum)
[facility] NAME: Heather Small    MR#:  [id number]  DATE OF BIRTH:  21-Dec-1935  DATE OF ADMISSION:  02/18/2017  PRIMARY CARE PHYSICIAN: Samara Deist, MD   REQUESTING/REFERRING PHYSICIAN: Dr. Lavonia Drafts  CHIEF COMPLAINT:   Chief Complaint  Patient presents with  . Follow-up    HISTORY OF PRESENT ILLNESS:  Heather Small  is a 81 y.o. female with a known history of  Hypertension, insulin-dependent diabetes mellitus, hypothyroidism presents to the hospital secondary to weakness and chills. Patient started having chills for a day and came to the ER yesterday. She has been complaining of increased frequency of urination, though at baseline she has some increased frequency due to her diabetes. Denies any dysuria or urgency. She had minimal abdominal pain and she was in the ER yesterday. White count was elevated so a CT abdomen was done. It shows cholelithiasis but no evidence of infection. Since her lactic acid and WBC were high, she was advised to get admitted. However patient had to take care of some personal things, so she left and came back today. She denies any focal symptoms, no cough congestion. Urine analysis from yesterday was negative for any infection. No other source of infection has been identified so far. Her chills are improving, however her white count is increasing. So she is being admitted under observation.  PAST MEDICAL HISTORY:   Past Medical History:  Diagnosis Date  . Diabetes mellitus without complication ([place])   . Hyperlipemia   . Hypertension   . Thyroid disease     PAST SURGICAL HISTORY:   Past Surgical History:  Procedure Laterality Date  . ESOPHAGEAL DILATION      SOCIAL HISTORY:   Social History  Substance Use Topics  . Smoking status: Never Smoker  . Smokeless tobacco: Never Used  . Alcohol use No    FAMILY HISTORY:   Family History  Problem Relation Age of Onset  . Diabetes Other    . Heart Problems Mother   . Cancer Father     DRUG ALLERGIES:   Allergies  Allergen Reactions  . Codeine Anaphylaxis    Couldn't swallow, was rushed to hospital    REVIEW OF SYSTEMS:   Review of Systems  Constitutional: Positive for chills and malaise/fatigue. Negative for fever and weight loss.  HENT: Negative for ear discharge, ear pain, hearing loss, nosebleeds and tinnitus.   Eyes: Negative for blurred vision, double vision and photophobia.  Respiratory: Negative for cough, hemoptysis, shortness of breath and wheezing.   Cardiovascular: Negative for chest pain, palpitations, orthopnea and leg swelling.  Gastrointestinal: Negative for abdominal pain, constipation, diarrhea, heartburn, melena, nausea and vomiting.  Genitourinary: Negative for dysuria, frequency, hematuria and urgency.  Musculoskeletal: Negative for back pain, myalgias and neck pain.  Skin: Negative for rash.  Neurological: Negative for dizziness, tingling, tremors, sensory change, speech change, focal weakness and headaches.  Endo/Heme/Allergies: Does not bruise/bleed easily.  Psychiatric/Behavioral: Negative for depression.    MEDICATIONS AT HOME:   Prior to Admission medications   Medication Sig Start Date End Date Taking? Authorizing Provider  AZOPT 1 % ophthalmic suspension Place 1 drop into both eyes 2 (two) times daily. 04/11/15  Yes [provider]  benzonatate (TESSALON) 200 MG capsule Take 1 capsule (200 mg total) by mouth every 8 (eight) hours. 03/19/16  Yes Lorin Picket, PA-C  brimonidine (ALPHAGAN) 0.15 % ophthalmic solution Place 1 drop into both eyes 2 (two)  times daily. 04/11/15  Yes [provider]  levothyroxine (SYNTHROID, LEVOTHROID) 88 MCG tablet Take 1 tablet by mouth daily. 06/05/15  Yes [provider]  LUMIGAN 0.01 % SOLN Place 1 drop into both eyes at bedtime. 04/11/15  Yes [provider]  metFORMIN (GLUCOPHAGE) 500 MG tablet Take 1 tablet by  mouth 2 (two) times daily. 06/15/15  Yes [provider]  Multiple Vitamin (MULTIVITAMIN) tablet Take 1 tablet by mouth daily.   Yes [provider]  omeprazole (PRILOSEC) 20 MG capsule Take 1 capsule by mouth at bedtime. 04/13/15  Yes [provider]  simvastatin (ZOCOR) 20 MG tablet Take 1 tablet by mouth at bedtime. 06/15/15  Yes [provider]  timolol (TIMOPTIC) 0.5 % ophthalmic solution Place 1 drop into both eyes 2 (two) times daily. 04/11/15  Yes [provider]  valsartan-hydrochlorothiazide (DIOVAN-HCT) 320-25 MG tablet Take 1 tablet by mouth daily. 04/13/15  Yes [provider]  traMADol (ULTRAM) 50 MG tablet Take 1 tablet (50 mg total) by mouth every 12 (twelve) hours as needed. 11/30/16   Sable Feil, PA-C      VITAL SIGNS:  Blood pressure (!) 149/74, pulse (!) 106, temperature 98.5 F (36.9 C), temperature source Oral, resp. rate 17, height 5\' 3"  (1.6 m), weight 65.8 kg (145 lb), SpO2 97 %.  PHYSICAL EXAMINATION:   Physical Exam  GENERAL:  81 y.o.-year-old patient lying in the bed with no acute distress.  EYES: Pupils equal, round, reactive to light and accommodation. Mildly erythematous conjunctiva. No scleral icterus. Extraocular muscles intact.  HEENT: Head atraumatic, normocephalic. Oropharynx and nasopharynx clear.  NECK:  Supple, no jugular venous distention. No thyroid enlargement, no tenderness.  LUNGS: Normal breath sounds bilaterally, no wheezing, rales,rhonchi or crepitation. No use of accessory muscles of respiration.  CARDIOVASCULAR: S1, S2 normal. No murmurs, rubs, or gallops.  ABDOMEN: Soft, nontender, nondistended. Bowel sounds present. No organomegaly or mass.  EXTREMITIES: No pedal edema, cyanosis, or clubbing.  NEUROLOGIC: Cranial nerves II through XII are intact. Muscle strength 5/5 in all extremities. Sensation intact. Gait not checked.  PSYCHIATRIC: The patient is alert and oriented x 3.  SKIN: No  obvious rash, lesion, or ulcer.   LABORATORY PANEL:   CBC  Recent Labs Lab 02/18/17 1020  WBC 24.2*  HGB 11.2*  HCT 33.9*  PLT 292   ------------------------------------------------------------------------------------------------------------------  Chemistries   Recent Labs Lab 02/18/17 1020  NA 136  K 3.2*  CL 101  CO2 24  GLUCOSE 277*  BUN 13  CREATININE 0.97  CALCIUM 9.1  AST 31  ALT 14  ALKPHOS 59  BILITOT 0.6   ------------------------------------------------------------------------------------------------------------------  Cardiac Enzymes  Recent Labs Lab 02/17/17 1924  TROPONINI <0.03   ------------------------------------------------------------------------------------------------------------------  RADIOLOGY:  Dg Chest 2 View  Result Date: 02/18/2017 CLINICAL DATA:  Weakness. EXAM: CHEST  2 VIEW COMPARISON:  11/30/2016 and 03/19/2016 FINDINGS: Again noted is a 2 cm well-defined nodule in the right lower lobe, essentially unchanged in size and configuration since the prior study of 03/19/2016. The lungs are otherwise clear. Large chronic hiatal hernia. Pulmonary vascularity is at the upper limits of normal. No effusions. No acute bone abnormality. IMPRESSION: No acute abnormality. Stable 2 cm nodule in the right lower lobe. Has this been previously characterized by CT scan? Large hiatal hernia. Electronically Signed   By: Lorriane Shire M.D.   On: 02/18/2017 11:11   Ct Abdomen Pelvis W Contrast  Result Date: 02/17/2017 CLINICAL DATA:  81 y/o F;  periumbilical abdominal pain, fever, tachycardia. EXAM: CT ABDOMEN AND PELVIS WITH CONTRAST TECHNIQUE: Multidetector CT imaging of the abdomen and pelvis was performed using the standard protocol following bolus administration of intravenous contrast. CONTRAST:  138mL ISOVUE-300 IOPAMIDOL (ISOVUE-300) INJECTION 61% COMPARISON:  None. FINDINGS: Lower chest: Large hiatal hernia. Hepatobiliary: No focal liver lesion.  Cholelithiasis. No intra or extrahepatic biliary ductal dilatation. Pancreas: Unremarkable. No pancreatic ductal dilatation or surrounding inflammatory changes. Spleen: Normal in size without focal abnormality. Adrenals/Urinary Tract: Normal adrenal glands. Multiple renal cysts are present bilaterally the largest in the right upper pole measuring 23 mm. There are several calcifications in the right kidney compatible with stones measuring up to 8 mm. No hydronephrosis. Normal bladder. Stomach/Bowel: Stomach is within normal limits. Appendix appears normal. No evidence of bowel wall thickening, distention, or inflammatory changes. Scattered sigmoid diverticulosis. Vascular/Lymphatic: Aortic atherosclerosis with extensive calcification. No enlarged abdominal or pelvic lymph nodes. Reproductive: Numerous uterine myoma the largest in the lower body measuring up to 7 cm. Normal adnexa. Other: No abdominal wall hernia or abnormality. No abdominopelvic ascites. Musculoskeletal: No acute osseous abnormality is evident. Mild osteoarthrosis of the hip joints. Lower lumbar spine spondylosis with moderate loss of disc space height at L4-5 and L5-S1. IMPRESSION: 1. Large hiatal hernia. 2. Cholelithiasis. 3. Multiple bilateral renal cysts and right-sided renal stones. 4. Scattered sigmoid diverticulosis without evidence for diverticulitis. 5. 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 Electronically Signed   By: Kristine Garbe M.D.   On: 02/17/2017 20:51   US Abdomen Limited Ruq  Result Date: 02/17/2017 CLINICAL DATA:  Periumbilical pain for 3 days. History of hypertension and diabetes. EXAM: ULTRASOUND ABDOMEN LIMITED RIGHT UPPER QUADRANT COMPARISON:  CT abdomen and pelvis February 17, 2017 at 2036 hours FINDINGS: Gallbladder: Multiple echogenic gallstones measuring to 4 mm scans sludge. No gallbladder wall thickening or pericholecystic fluid. No sonographic Murphy's sign elicited. Common bile duct: Diameter: 5 mm  Liver: Diffusely mildly increased echogenicity of the intrahepatic biliary dilatation. Hepatopetal portal vein. IMPRESSION: Cholelithiasis and sludge without sonographic findings of acute cholecystitis. Electronically Signed   By: Elon Alas M.D.   On: 02/17/2017 21:41    EKG:   Orders placed or performed during the hospital encounter of 02/17/17  . EKG 12-Lead  . EKG 12-Lead    IMPRESSION AND PLAN:   Heather Small  is a 81 y.o. female with a known history of  Hypertension, insulin-dependent diabetes mellitus, hypothyroidism presents to the hospital secondary to weakness and chills.  #1 early sepsis-unknown source at this time. -Repeat urine analysis, urine cultures and blood cultures. -Empiric vancomycin and cefepime until cultures are back. -IV fluids. Follow-up WBC in a.m. - ordered procalcitonin level  #2 hypokalemia-being replaced  #3 diabetes mellitus-on metformin at home. Continue, sliding scale insulin.  #4 hypertension-hold Diovan, give IV fluids for now.  #5 DVT prophylaxis-Lovenox   All the records are reviewed and case discussed with ED provider. Management plans discussed with the patient, family and they are in agreement.  CODE STATUS: Full code  TOTAL TIME TAKING CARE OF THIS PATIENT: 50 minutes.    Gladstone Lighter M.D on 02/18/2017 at 2:18 PM  Between 7am to 6pm - Pager - [phone]  After 6pm go to [email or web address] - password [email or web address] [facility]  Office  [phone]  CC: Primary care physician; Samara Deist, MD

## 2017-02-18 NOTE — Progress Notes (Signed)
Pharmacy Antibiotic Note  Maciah Feeback is a 81 y.o. female admitted on 02/18/2017 with sepsis.  Pharmacy has been consulted for vancomycin and cefepime dosing.  Plan:  Ke: 0.041    VD: 46.06   Half life: 16.9 hours Will start patient on Vancomycin 1000mg   IV every 24 hours with 7 hour stack dosing.  Goal trough 15-20 mcg/mL.  Trough ordered prior to 4th dose. Calculated trough at Css is 15.8.  Cefepime 2g IV every 24 hours based on current CrCl <67ml.min.   Height: 5\' 3"  (160 cm) Weight: 145 lb (65.8 kg) IBW/kg (Calculated) : 52.4  Temp (24hrs), Avg:98.4 F (36.9 C), Min:97.6 F (36.4 C), Max:99 F (37.2 C)   Recent Labs Lab 02/17/17 1924 02/17/17 1959 02/18/17 1020 02/18/17 1247  WBC 16.2*  --  24.2*  --   CREATININE 0.78  --  0.97  --   LATICACIDVEN  --  2.3*  --  1.5    Estimated Creatinine Clearance: 43.6 mL/min (by C-G formula based on SCr of 0.97 mg/dL).    Allergies  Allergen Reactions  . Codeine Anaphylaxis    Couldn't swallow, was rushed to hospital    Antimicrobials this admission: 7/10 Vancomycin >>  7/10 Cefepime  >>   Dose adjustments this admission:  Microbiology results: 7/10 BCx: pending  Thank you for allowing pharmacy to be a part of this patient's care.  Pernell Dupre, PharmD, BCPS Clinical Pharmacist 02/18/2017 4:13 PM

## 2017-02-18 NOTE — ED Provider Notes (Signed)
[facility] Emergency Department Provider Note   ____________________________________________    I have reviewed the triage vital signs and the nursing notes.   HISTORY  Chief Complaint Follow-up     HPI Heather Small is a 81 y.o. female who presents in follow-up after being seen yesterday. Yesterday she came for abdominal pain which was periumbilic which had lasted for 3 days. She had a CT scan and an ultrasound and observation was recommended however she refused and said she would come back today for recheck. She reports feeling better today. She has no abdominal pain. No vomiting. Denies fevers or chills. She denies dysuria. Review of records demonstrates elevated white blood cell count yesterday but normal urine, unremarkable CT scan of the abdomen.   Past Medical History:  Diagnosis Date  . Diabetes mellitus without complication ([place])   . Hyperlipemia   . Hypertension   . Thyroid disease     Patient Active Problem List   Diagnosis Date Noted  . Stroke [facility]) 07/01/2015    Past Surgical History:  Procedure Laterality Date  . ESOPHAGEAL DILATION      Prior to Admission medications   Medication Sig Start Date End Date Taking? Authorizing Provider  AZOPT 1 % ophthalmic suspension Place 1 drop into both eyes 2 (two) times daily. 04/11/15  Yes [provider]  benzonatate (TESSALON) 200 MG capsule Take 1 capsule (200 mg total) by mouth every 8 (eight) hours. 03/19/16  Yes Lorin Picket, PA-C  brimonidine (ALPHAGAN) 0.15 % ophthalmic solution Place 1 drop into both eyes 2 (two) times daily. 04/11/15  Yes [provider]  levothyroxine (SYNTHROID, LEVOTHROID) 88 MCG tablet Take 1 tablet by mouth daily. 06/05/15  Yes [provider]  LUMIGAN 0.01 % SOLN Place 1 drop into both eyes at bedtime. 04/11/15  Yes [provider]  metFORMIN (GLUCOPHAGE) 500 MG tablet Take 1 tablet by mouth 2 (two) times daily. 06/15/15   Yes [provider]  Multiple Vitamin (MULTIVITAMIN) tablet Take 1 tablet by mouth daily.   Yes [provider]  omeprazole (PRILOSEC) 20 MG capsule Take 1 capsule by mouth at bedtime. 04/13/15  Yes [provider]  simvastatin (ZOCOR) 20 MG tablet Take 1 tablet by mouth at bedtime. 06/15/15  Yes [provider]  timolol (TIMOPTIC) 0.5 % ophthalmic solution Place 1 drop into both eyes 2 (two) times daily. 04/11/15  Yes [provider]  valsartan-hydrochlorothiazide (DIOVAN-HCT) 320-25 MG tablet Take 1 tablet by mouth daily. 04/13/15  Yes [provider]  traMADol (ULTRAM) 50 MG tablet Take 1 tablet (50 mg total) by mouth every 12 (twelve) hours as needed. 11/30/16   Sable Feil, PA-C     Allergies Codeine  Family History  Problem Relation Age of Onset  . Diabetes Other   . Heart Problems Mother   . Cancer Father     Social History Social History  Substance Use Topics  . Smoking status: Never Smoker  . Smokeless tobacco: Never Used  . Alcohol use No    Review of Systems  Constitutional: No fever/chills Eyes: No visual changes.  ENT: No sore throat Or neck pain Cardiovascular: Denies chest pain. Respiratory: Denies shortness of breath. Gastrointestinal: No abdominal pain.  No nausea, no vomiting.   Genitourinary: Negative for dysuria. Musculoskeletal: Negative for back pain. Skin: Negative for rash. Neurological: Negative for headaches   ____________________________________________   PHYSICAL EXAM:  VITAL SIGNS: ED Triage Vitals  Enc Vitals Group  BP 02/18/17 0936 (!) 129/56     Pulse Rate 02/18/17 0936 100     Resp 02/18/17 0936 18     Temp 02/18/17 0936 98.5 F (36.9 C)     Temp Source 02/18/17 0936 Oral     SpO2 02/18/17 0936 98 %     Weight 02/18/17 0937 65.8 kg (145 lb)     Height 02/18/17 0937 1.6 m (5\' 3" )     Head Circumference --      Peak Flow --      Pain Score --      Pain Loc --      Pain  Edu? --      Excl. in [place]? --     Constitutional: Alert and oriented. No acute distress. Pleasant and interactive Eyes: Conjunctivae are normal.  Head: Atraumatic. Nose: No congestion/rhinnorhea. Mouth/Throat: Mucous membranes are moist.   Neck:  Painless ROM Cardiovascular: Normal rate, regular rhythm. Grossly normal heart sounds.  Good peripheral circulation. Respiratory: Normal respiratory effort.  No retractions. Lungs CTAB. Gastrointestinal: Soft and nontender. No distention.  No CVA tenderness. Genitourinary: deferred Musculoskeletal: Warm and well perfused Neurologic:  Normal speech and language.  Skin:  Skin is warm, dry and intact. No rash noted. Psychiatric: Mood and affect are normal. Speech and behavior are normal.  ____________________________________________   LABS (all labs ordered are listed, but only abnormal results are displayed)  Labs Reviewed  CBC - Abnormal; Notable for the following:       Result Value   WBC 24.2 (*)    Hemoglobin 11.2 (*)    HCT 33.9 (*)    All other components within normal limits  COMPREHENSIVE METABOLIC PANEL - Abnormal; Notable for the following:    Potassium 3.2 (*)    Glucose, Bld 277 (*)    Albumin 3.4 (*)    GFR calc non Af Amer 55 (*)    All other components within normal limits  GLUCOSE, CAPILLARY - Abnormal; Notable for the following:    Glucose-Capillary 272 (*)    All other components within normal limits  CULTURE, BLOOD (ROUTINE X 2)  CULTURE, BLOOD (ROUTINE X 2)  LIPASE, BLOOD  URINALYSIS, COMPLETE (UACMP) WITH MICROSCOPIC  LACTIC ACID, PLASMA  LACTIC ACID, PLASMA  CBG MONITORING, ED   ____________________________________________  EKG  None ____________________________________________  RADIOLOGY  Chest x-ray no acute distress ____________________________________________   PROCEDURES  Procedure(s) performed: No    Critical Care performed:  No ____________________________________________   INITIAL IMPRESSION / ASSESSMENT AND PLAN / ED COURSE  Pertinent labs & imaging results that were available during my care of the patient were reviewed by me and considered in my medical decision making (see chart for details).  Patient presents today for recheck after refusing admission yesterday. She reports her abdominal pain is better today. She feels generally improved. No fevers or chills. No new symptoms have developed.  Her glucose is mildly elevated, we'll check labs and give IV fluids as her heart rate is still borderline although markedly improved from yesterday  ----------------------------------------- 12:46 PM on 02/18/2017 -----------------------------------------  Patient's white count has increased from yesterday, she remains tachycardic. Given that she had an elevated lactate acid yesterday her white count has increased today. Blood cultures and I recommended admission to the patient. I'll consult internal medicine for further evaluation    ____________________________________________   FINAL CLINICAL IMPRESSION(S) / ED DIAGNOSES  Final diagnoses:  SIRS (systemic inflammatory response syndrome) (HCC)      NEW MEDICATIONS STARTED  DURING THIS VISIT:  New Prescriptions   No medications on file     Note:  This document was prepared using Dragon voice recognition software and may include unintentional dictation errors.    Lavonia Drafts, MD 02/18/17 [phone]

## 2017-02-19 DIAGNOSIS — A419 Sepsis, unspecified organism: Secondary | ICD-10-CM | POA: Diagnosis not present

## 2017-02-19 LAB — CBC
HEMATOCRIT: 32 % — AB (ref 35.0–47.0)
Hemoglobin: 10.5 g/dL — ABNORMAL LOW (ref 12.0–16.0)
MCH: 27.4 pg (ref 26.0–34.0)
MCHC: 32.7 g/dL (ref 32.0–36.0)
MCV: 83.8 fL (ref 80.0–100.0)
PLATELETS: 247 10*3/uL (ref 150–440)
RBC: 3.82 MIL/uL (ref 3.80–5.20)
RDW: 13.6 % (ref 11.5–14.5)
WBC: 11.5 10*3/uL — AB (ref 3.6–11.0)

## 2017-02-19 LAB — BASIC METABOLIC PANEL
Anion gap: 6 (ref 5–15)
BUN: 11 mg/dL (ref 6–20)
CALCIUM: 8.8 mg/dL — AB (ref 8.9–10.3)
CO2: 26 mmol/L (ref 22–32)
CREATININE: 0.8 mg/dL (ref 0.44–1.00)
Chloride: 107 mmol/L (ref 101–111)
GFR calc Af Amer: >60 mL/min (ref >60)
GLUCOSE: 126 mg/dL — AB (ref 65–99)
POTASSIUM: 3.4 mmol/L — AB (ref 3.5–5.1)
SODIUM: 139 mmol/L (ref 135–145)

## 2017-02-19 LAB — GLUCOSE, CAPILLARY
GLUCOSE-CAPILLARY: 216 mg/dL — AB (ref 65–99)
Glucose-Capillary: 127 mg/dL — ABNORMAL HIGH (ref 65–99)

## 2017-02-19 LAB — HEMOGLOBIN A1C
HEMOGLOBIN A1C: 7.1 % — AB (ref 4.8–5.6)
MEAN PLASMA GLUCOSE: 157 mg/dL

## 2017-02-19 MED ORDER — CEPHALEXIN 500 MG PO CAPS
500.0000 mg | ORAL_CAPSULE | Freq: Two times a day (BID) | ORAL | Status: DC
Start: 2017-02-19 — End: 2017-02-19

## 2017-02-19 MED ORDER — VANCOMYCIN HCL IN DEXTROSE 1-5 GM/200ML-% IV SOLN
1000.0000 mg | INTRAVENOUS | Status: DC
  Filled 2017-02-19: qty 200

## 2017-02-19 MED ORDER — DEXTROSE 5 % IV SOLN
2.0000 g | Freq: Two times a day (BID) | INTRAVENOUS | Status: DC
  Filled 2017-02-19 (×2): qty 2

## 2017-02-19 MED ORDER — CEPHALEXIN 500 MG PO CAPS: 500.0000 mg | ORAL_CAPSULE | Freq: Two times a day (BID) | ORAL | 0 refills | Status: DC

## 2017-02-19 NOTE — Progress Notes (Signed)
Pharmacy Antibiotic Note  Heather Small is a 81 y.o. female admitted on 02/18/2017 with sepsis.  Pharmacy has been consulted for vancomycin and cefepime dosing.  Plan:  Creatinine clearance has improved to >50 mL/min. Vancomycin dose modified to 1 gm IV Q18H, predicted trough 18 mcg/mL. Pharmacy will continue to follow and adjust as needed to maintain trough 15 to 20 mcg/mL.   Cefepime adjusted to 2 gm IV Q12H  Height: 5\' 3"  (160 cm) Weight: 145 lb (65.8 kg) IBW/kg (Calculated) : 52.4  Temp (24hrs), Avg:97.8 F (36.6 C), Min:97.6 F (36.4 C), Max:98.1 F (36.7 C)   Recent Labs Lab 02/17/17 1924 02/17/17 1959 02/18/17 1020 02/18/17 1247 02/19/17 0543  WBC 16.2*  --  24.2*  --  11.5*  CREATININE 0.78  --  0.97  --  0.80  LATICACIDVEN  --  2.3*  --  1.5  --     Estimated Creatinine Clearance: 52.9 mL/min (by C-G formula based on SCr of 0.8 mg/dL).    Allergies  Allergen Reactions  . Codeine Anaphylaxis    Couldn't swallow, was rushed to hospital    Antimicrobials this admission: 7/10 Vancomycin >>  7/10 Cefepime  >>   Dose adjustments this admission:  Microbiology results: 7/10 BCx: pending  Thank you for allowing pharmacy to be a part of this patient's care.  Laural Benes, PharmD, BCPS Clinical Pharmacist 02/19/2017 9:40 AM

## 2017-02-19 NOTE — Discharge Summary (Signed)
[place] [facility] [place] NAME: Heather Small    MR#:  [id number]  DATE OF BIRTH:  August 07, 1936  DATE OF ADMISSION:  02/18/2017 ADMITTING PHYSICIAN: Gladstone Lighter, MD  DATE OF DISCHARGE: 02/19/17  PRIMARY CARE PHYSICIAN: Samara Deist, MD    ADMISSION DIAGNOSIS:  SIRS (systemic inflammatory response syndrome) (HCC) [R65.10]  DISCHARGE DIAGNOSIS:  Sepsis on admission-resoled  UTI  SECONDARY DIAGNOSIS:   Past Medical History:  Diagnosis Date  . Diabetes mellitus without complication ([place])   . Hyperlipemia   . Hypertension   . Thyroid disease     HOSPITAL COURSE:  Heather Small  is a 81 y.o. female with a known history of  Hypertension, insulin-dependent diabetes mellitus, hypothyroidism presents to the hospital secondary to weakness and chills.  #1 early Present on admission resolved. -Patient came in with fever or chills and some minimal symptoms of dysuria and elevated white count of 24,000 with lactic acid of 2. She was somewhat tachycardic. -Urine cultures pending. Blood cultures negative. Patient afebrile filling back to baseline. -Empiric vancomycin and cefepime until cultures are back.---Changed to by mouth Keflex -Came in with white count of 24,000----down to 11,000 -Patient is afebrile -Ultrasound abdomen showed cholelithiasis without signs symptoms of cholecystitis  #2 hypokalemia-being replaced  #3 diabetes mellitus-on metformin at home. Continue, sliding scale insulin.  #4 hypertension resume Diovan  #5 DVT prophylaxis-Lovenox  Overall she feels good. She is eating well. Hemodynamically stable. We'll discharge her on by mouth Keflex for 7 days with outpatient follow-up with her primary care physician. Patient is agreeable.  CONSULTS OBTAINED:    DRUG ALLERGIES:   Allergies  Allergen Reactions  . Codeine Anaphylaxis    Couldn't swallow, was rushed to hospital    DISCHARGE MEDICATIONS:    Current Discharge Medication List    START taking these medications   Details  cephALEXin (KEFLEX) 500 MG capsule Take 1 capsule (500 mg total) by mouth 2 (two) times daily. Qty: 14 capsule, Refills: 0      CONTINUE these medications which have NOT CHANGED   Details  AZOPT 1 % ophthalmic suspension Place 1 drop into both eyes 2 (two) times daily.    benzonatate (TESSALON) 200 MG capsule Take 1 capsule (200 mg total) by mouth every 8 (eight) hours. Qty: 21 capsule, Refills: 0    brimonidine (ALPHAGAN) 0.15 % ophthalmic solution Place 1 drop into both eyes 2 (two) times daily.    levothyroxine (SYNTHROID, LEVOTHROID) 88 MCG tablet Take 1 tablet by mouth daily.    LUMIGAN 0.01 % SOLN Place 1 drop into both eyes at bedtime.    metFORMIN (GLUCOPHAGE) 500 MG tablet Take 1 tablet by mouth 2 (two) times daily.    Multiple Vitamin (MULTIVITAMIN) tablet Take 1 tablet by mouth daily.    omeprazole (PRILOSEC) 20 MG capsule Take 1 capsule by mouth at bedtime.    simvastatin (ZOCOR) 20 MG tablet Take 1 tablet by mouth at bedtime.    timolol (TIMOPTIC) 0.5 % ophthalmic solution Place 1 drop into both eyes 2 (two) times daily.    valsartan-hydrochlorothiazide (DIOVAN-HCT) 320-25 MG tablet Take 1 tablet by mouth daily.    traMADol (ULTRAM) 50 MG tablet Take 1 tablet (50 mg total) by mouth every 12 (twelve) hours as needed. Qty: 12 tablet, Refills: 0        If you experience worsening of your admission symptoms, develop shortness of breath, life threatening emergency, suicidal or homicidal thoughts you must seek  medical attention immediately by calling 911 or calling your MD immediately  if symptoms less severe.  You Must read complete instructions/literature along with all the possible adverse reactions/side effects for all the Medicines you take and that have been prescribed to you. Take any new Medicines after you have completely understood and accept all the possible adverse  reactions/side effects.   Please note  You were cared for by a hospitalist during your hospital stay. If you have any questions about your discharge medications or the care you received while you were in the hospital after you are discharged, you can call the unit and asked to speak with the hospitalist on call if the hospitalist that took care of you is not available. Once you are discharged, your primary care physician will handle any further medical issues. Please note that NO REFILLS for any discharge medications will be authorized once you are discharged, as it is imperative that you return to your primary care physician (or establish a relationship with a primary care physician if you do not have one) for your aftercare needs so that they can reassess your need for medications and monitor your lab values. Today   SUBJECTIVE   I feel a lot better denies any abdominal pain. Eat breakfast about 50%  VITAL SIGNS:  Blood pressure (!) 149/70, pulse 98, temperature 98.1 F (36.7 C), temperature source Oral, resp. rate 18, height 5\' 3"  (1.6 m), weight 65.8 kg (145 lb), SpO2 99 %.  I/O:   Intake/Output Summary (Last 24 hours) at 02/19/17 1128 Last data filed at 02/19/17 1110  Gross per 24 hour  Intake          2473.75 ml  Output             2200 ml  Net           273.75 ml    PHYSICAL EXAMINATION:  GENERAL:  81 y.o.-year-old patient lying in the bed with no acute distress.  EYES: Pupils equal, round, reactive to light and accommodation. No scleral icterus. Extraocular muscles intact.  HEENT: Head atraumatic, normocephalic. Oropharynx and nasopharynx clear.  NECK:  Supple, no jugular venous distention. No thyroid enlargement, no tenderness.  LUNGS: Normal breath sounds bilaterally, no wheezing, rales,rhonchi or crepitation. No use of accessory muscles of respiration.  CARDIOVASCULAR: S1, S2 normal. No murmurs, rubs, or gallops.  ABDOMEN: Soft, non-tender, non-distended. Bowel sounds  present. No organomegaly or mass.  EXTREMITIES: No pedal edema, cyanosis, or clubbing.  NEUROLOGIC: Cranial nerves II through XII are intact. Muscle strength 5/5 in all extremities. Sensation intact. Gait not checked.  PSYCHIATRIC: The patient is alert and oriented x 3.  SKIN: No obvious rash, lesion, or ulcer.   DATA REVIEW:   CBC   Recent Labs Lab 02/19/17 0543  WBC 11.5*  HGB 10.5*  HCT 32.0*  PLT 247    Chemistries   Recent Labs Lab 02/18/17 1020 02/19/17 0543  NA 136 139  K 3.2* 3.4*  CL 101 107  CO2 24 26  GLUCOSE 277* 126*  BUN 13 11  CREATININE 0.97 0.80  CALCIUM 9.1 8.8*  AST 31  --   ALT 14  --   ALKPHOS 59  --   BILITOT 0.6  --     Microbiology Results   Recent Results (from the past 240 hour(s))  Blood culture (routine x 2)     Status: None (Preliminary result)   Collection Time: 02/18/17 12:47 PM  Result Value Ref Range Status  Specimen Description BLOOD RIGHT AC  Final   Special Requests   Final    BOTTLES DRAWN AEROBIC AND ANAEROBIC Blood Culture adequate volume   Culture NO GROWTH < 24 HOURS  Final   Report Status PENDING  Incomplete  Blood culture (routine x 2)     Status: None (Preliminary result)   Collection Time: 02/18/17 12:52 PM  Result Value Ref Range Status   Specimen Description BLOOD  LEFT AC  Final   Special Requests   Final    BOTTLES DRAWN AEROBIC AND ANAEROBIC Blood Culture results may not be optimal due to an inadequate volume of blood received in culture bottles   Culture NO GROWTH < 24 HOURS  Final   Report Status PENDING  Incomplete    RADIOLOGY:  Dg Chest 2 View  Result Date: 02/18/2017 CLINICAL DATA:  Weakness. EXAM: CHEST  2 VIEW COMPARISON:  11/30/2016 and 03/19/2016 FINDINGS: Again noted is a 2 cm well-defined nodule in the right lower lobe, essentially unchanged in size and configuration since the prior study of 03/19/2016. The lungs are otherwise clear. Large chronic hiatal hernia. Pulmonary vascularity is at  the upper limits of normal. No effusions. No acute bone abnormality. IMPRESSION: No acute abnormality. Stable 2 cm nodule in the right lower lobe. Has this been previously characterized by CT scan? Large hiatal hernia. Electronically Signed   By: Lorriane Shire M.D.   On: 02/18/2017 11:11   Ct Abdomen Pelvis W Contrast  Result Date: 02/17/2017 CLINICAL DATA:  81 y/o F; periumbilical abdominal pain, fever, tachycardia. EXAM: CT ABDOMEN AND PELVIS WITH CONTRAST TECHNIQUE: Multidetector CT imaging of the abdomen and pelvis was performed using the standard protocol following bolus administration of intravenous contrast. CONTRAST:  168mL ISOVUE-300 IOPAMIDOL (ISOVUE-300) INJECTION 61% COMPARISON:  None. FINDINGS: Lower chest: Large hiatal hernia. Hepatobiliary: No focal liver lesion. Cholelithiasis. No intra or extrahepatic biliary ductal dilatation. Pancreas: Unremarkable. No pancreatic ductal dilatation or surrounding inflammatory changes. Spleen: Normal in size without focal abnormality. Adrenals/Urinary Tract: Normal adrenal glands. Multiple renal cysts are present bilaterally the largest in the right upper pole measuring 23 mm. There are several calcifications in the right kidney compatible with stones measuring up to 8 mm. No hydronephrosis. Normal bladder. Stomach/Bowel: Stomach is within normal limits. Appendix appears normal. No evidence of bowel wall thickening, distention, or inflammatory changes. Scattered sigmoid diverticulosis. Vascular/Lymphatic: Aortic atherosclerosis with extensive calcification. No enlarged abdominal or pelvic lymph nodes. Reproductive: Numerous uterine myoma the largest in the lower body measuring up to 7 cm. Normal adnexa. Other: No abdominal wall hernia or abnormality. No abdominopelvic ascites. Musculoskeletal: No acute osseous abnormality is evident. Mild osteoarthrosis of the hip joints. Lower lumbar spine spondylosis with moderate loss of disc space height at L4-5 and L5-S1.  IMPRESSION: 1. Large hiatal hernia. 2. Cholelithiasis. 3. Multiple bilateral renal cysts and right-sided renal stones. 4. Scattered sigmoid diverticulosis without evidence for diverticulitis. 5. 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 Electronically Signed   By: Kristine Garbe M.D.   On: 02/17/2017 20:51   US Abdomen Limited Ruq  Result Date: 02/17/2017 CLINICAL DATA:  Periumbilical pain for 3 days. History of hypertension and diabetes. EXAM: ULTRASOUND ABDOMEN LIMITED RIGHT UPPER QUADRANT COMPARISON:  CT abdomen and pelvis February 17, 2017 at 2036 hours FINDINGS: Gallbladder: Multiple echogenic gallstones measuring to 4 mm scans sludge. No gallbladder wall thickening or pericholecystic fluid. No sonographic Murphy's sign elicited. Common bile duct: Diameter: 5 mm Liver: Diffusely mildly increased echogenicity of the  intrahepatic biliary dilatation. Hepatopetal portal vein. IMPRESSION: Cholelithiasis and sludge without sonographic findings of acute cholecystitis. Electronically Signed   By: Elon Alas M.D.   On: 02/17/2017 21:41     Management plans discussed with the patient, family and they are in agreement.  CODE STATUS:     Code Status Orders        Start     Ordered   02/18/17 1519  Full code  Continuous     02/18/17 1519    Code Status History    Date Active Date Inactive Code Status Order ID Comments User Context   07/01/2015  9:46 PM 07/02/2015  4:38 PM Full Code [id number]  Hower, Aaron Mose, MD ED       TOTAL TIME TAKING CARE OF THIS PATIENT:  40 minutes.    Kirill Chatterjee M.D on 02/19/2017 at 11:28 AM  Between 7am to 6pm - Pager - [phone] After 6pm go to [email or web address] - password [email or web address] [facility]  Office  [phone]  CC: Primary care physician; Samara Deist, MD

## 2017-02-19 NOTE — Care Management (Signed)
CM screen. Patient seen in ED 7/9 and admission was recommended at  that time but patient had to attend some personal business.  She returned  and was placed  in observation with sx concerning for sepsis.  WBC 24.2 up from 16.2 twenty four  hours prior.  She is from out of town and visiting her daughter.  Will return to daughter's home at discharge.  Independent in all adls, denies issues accessing medical care, obtaining medications or with transportation.  Current with her PCP.

## 2017-02-19 NOTE — Progress Notes (Signed)
Patient discharged to home. Concerns addressed. IV site removed. Prescription provided. To patient.

## 2017-02-19 NOTE — Care Management Obs Status (Signed)
[facility] NOTIFICATION   Patient Details  Name: Heather Small MRN: [id number] Date of Birth: Oct 10, 1935   Medicare Observation Status Notification Given:  Yes    Katrina Stack, RN 02/19/2017, 11:17 AM

## 2017-02-23 LAB — CULTURE, BLOOD (ROUTINE X 2)
CULTURE: NO GROWTH
CULTURE: NO GROWTH
Special Requests: ADEQUATE

## 2018-05-11 ENCOUNTER — Emergency Department: Payer: Medicare Other

## 2018-05-11 ENCOUNTER — Encounter: Payer: Self-pay | Admitting: Emergency Medicine

## 2018-05-11 ENCOUNTER — Emergency Department
Admission: EM | Admit: 2018-05-11 | Discharge: 2018-05-11 | Disposition: A | Discharge status: Home / Self Care | Payer: Medicare Other | Attending: Student in an Organized Health Care Education/Training Program | Admitting: Student in an Organized Health Care Education/Training Program

## 2018-05-11 ENCOUNTER — Other Ambulatory Visit: Payer: Self-pay

## 2018-05-11 DIAGNOSIS — E119 Type 2 diabetes mellitus without complications: Secondary | ICD-10-CM | POA: Insufficient documentation

## 2018-05-11 DIAGNOSIS — R1310 Dysphagia, unspecified: Secondary | ICD-10-CM | POA: Insufficient documentation

## 2018-05-11 DIAGNOSIS — Z7984 Long term (current) use of oral hypoglycemic drugs: Secondary | ICD-10-CM | POA: Insufficient documentation

## 2018-05-11 DIAGNOSIS — R1110 Vomiting, unspecified: Secondary | ICD-10-CM | POA: Diagnosis present

## 2018-05-11 DIAGNOSIS — I10 Essential (primary) hypertension: Secondary | ICD-10-CM | POA: Diagnosis not present

## 2018-05-11 LAB — COMPREHENSIVE METABOLIC PANEL
ALT: 11 U/L (ref 0–44)
AST: 21 U/L (ref 15–41)
Albumin: 3.9 g/dL (ref 3.5–5.0)
Alkaline Phosphatase: 56 U/L (ref 38–126)
Anion gap: 11 (ref 5–15)
BILIRUBIN TOTAL: 0.4 mg/dL (ref 0.3–1.2)
BUN: 15 mg/dL (ref 8–23)
CALCIUM: 9.3 mg/dL (ref 8.9–10.3)
CO2: 27 mmol/L (ref 22–32)
CREATININE: 0.74 mg/dL (ref 0.44–1.00)
Chloride: 100 mmol/L (ref 98–111)
Glucose, Bld: 149 mg/dL — ABNORMAL HIGH (ref 70–99)
Potassium: 3.2 mmol/L — ABNORMAL LOW (ref 3.5–5.1)
Sodium: 138 mmol/L (ref 135–145)
Total Protein: 7.6 g/dL (ref 6.5–8.1)

## 2018-05-11 LAB — URINALYSIS, COMPLETE (UACMP) WITH MICROSCOPIC
BILIRUBIN URINE: NEGATIVE
Bacteria, UA: NONE SEEN
GLUCOSE, UA: NEGATIVE mg/dL
HGB URINE DIPSTICK: NEGATIVE
Ketones, ur: NEGATIVE mg/dL
LEUKOCYTES UA: NEGATIVE
Nitrite: NEGATIVE
PROTEIN: NEGATIVE mg/dL
SPECIFIC GRAVITY, URINE: 1.009 (ref 1.005–1.030)
pH: 6 (ref 5.0–8.0)

## 2018-05-11 LAB — CBC
HCT: 35.2 % (ref 35.0–47.0)
Hemoglobin: 11.8 g/dL — ABNORMAL LOW (ref 12.0–16.0)
MCH: 28.3 pg (ref 26.0–34.0)
MCHC: 33.6 g/dL (ref 32.0–36.0)
MCV: 84.2 fL (ref 80.0–100.0)
PLATELETS: 329 10*3/uL (ref 150–440)
RBC: 4.18 MIL/uL (ref 3.80–5.20)
RDW: 13 % (ref 11.5–14.5)
WBC: 6.7 10*3/uL (ref 3.6–11.0)

## 2018-05-11 LAB — LIPASE, BLOOD: Lipase: 35 U/L (ref 11–51)

## 2018-05-11 MED ORDER — GI COCKTAIL ~~LOC~~
30.0000 mL | Freq: Once | ORAL | Status: AC
  Administered 2018-05-11: 30 mL via ORAL
  Filled 2018-05-11: qty 30

## 2018-05-11 MED ORDER — OMEPRAZOLE 20 MG PO CPDR: 40.0000 mg | DELAYED_RELEASE_CAPSULE | Freq: Two times a day (BID) | ORAL | 0 refills | Status: DC

## 2018-05-11 NOTE — Discharge Instructions (Addendum)
Please follow up with Dr. Vicente Males, GI.  I recommend you increase your Prilosec to 40mg  twice daily. I would encourage a soft diet for the next several days.  This means eating smoothies, soups, mash potato consistency food.   I recommend avoiding red meat or hard to chew foods as it can get stuck and cause irritation to the esophagus.

## 2018-05-11 NOTE — ED Provider Notes (Signed)
[facility] Emergency Department Provider Note    First MD Initiated Contact with Patient 05/11/18 1932     (approximate)  I have reviewed the triage vital signs and the nursing notes.   HISTORY  Chief Complaint Emesis    HPI Heather Small is a 82 y.o. female with a history of diabetes hypertension as well as esophageal narrowing requiring dilatation previously presents the ER for several weeks of intermittent epigastric discomfort followed by vomiting particularly after she eats some hard formed foods.  States she feels like she did prior to requiring her EGD with dilatation.  States she takes 20 mg of omeprazole daily.  Denies any chest pain or shortness of breath.   Has been able to tolerate liquids.  Has not been established with GI locally.   Past Medical History:  Diagnosis Date  . Diabetes mellitus without complication ([place])   . Hyperlipemia   . Hypertension   . Thyroid disease    Family History  Problem Relation Age of Onset  . Diabetes Other   . Heart Problems Mother   . Cancer Father    Past Surgical History:  Procedure Laterality Date  . ESOPHAGEAL DILATION     Patient Active Problem List   Diagnosis Date Noted  . Sepsis ([place]) 02/18/2017  . Stroke ([place]) 07/01/2015      Prior to Admission medications   Medication Sig Start Date End Date Taking? Authorizing Provider  AZOPT 1 % ophthalmic suspension Place 1 drop into both eyes 2 (two) times daily. 04/11/15   [provider]  benzonatate (TESSALON) 200 MG capsule Take 1 capsule (200 mg total) by mouth every 8 (eight) hours. 03/19/16   Lorin Picket, PA-C  brimonidine (ALPHAGAN) 0.15 % ophthalmic solution Place 1 drop into both eyes 2 (two) times daily. 04/11/15   [provider]  cephALEXin (KEFLEX) 500 MG capsule Take 1 capsule (500 mg total) by mouth 2 (two) times daily. 02/19/17   Fritzi Mandes, MD  levothyroxine (SYNTHROID, LEVOTHROID) 88 MCG tablet Take 1 tablet by  mouth daily. 06/05/15   [provider]  LUMIGAN 0.01 % SOLN Place 1 drop into both eyes at bedtime. 04/11/15   [provider]  metFORMIN (GLUCOPHAGE) 500 MG tablet Take 1 tablet by mouth 2 (two) times daily. 06/15/15   [provider]  Multiple Vitamin (MULTIVITAMIN) tablet Take 1 tablet by mouth daily.    [provider]  omeprazole (PRILOSEC) 20 MG capsule Take 2 capsules (40 mg total) by mouth 2 (two) times daily before a meal. 05/11/18   Merlyn Lot, MD  simvastatin (ZOCOR) 20 MG tablet Take 1 tablet by mouth at bedtime. 06/15/15   [provider]  timolol (TIMOPTIC) 0.5 % ophthalmic solution Place 1 drop into both eyes 2 (two) times daily. 04/11/15   [provider]  traMADol (ULTRAM) 50 MG tablet Take 1 tablet (50 mg total) by mouth every 12 (twelve) hours as needed. 11/30/16   Sable Feil, PA-C  valsartan-hydrochlorothiazide (DIOVAN-HCT) 320-25 MG tablet Take 1 tablet by mouth daily. 04/13/15   [provider]    Allergies Codeine    Social History Social History   Tobacco Use  . Smoking status: Never Smoker  . Smokeless tobacco: Never Used  Substance Use Topics  . Alcohol use: No  . Drug use: No    Review of Systems Patient denies headaches, rhinorrhea, blurry vision, numbness, shortness of breath, chest pain, edema, cough, abdominal pain, nausea, vomiting,  diarrhea, dysuria, fevers, rashes or hallucinations unless otherwise stated above in HPI. ____________________________________________   PHYSICAL EXAM:  VITAL SIGNS: Vitals:   05/11/18 2025 05/11/18 2052  BP: (!) 154/88 (!) 187/85  Pulse: 88 82  Resp: 18 16  SpO2: 97% 94%    Constitutional: Alert and oriented.  Eyes: Conjunctivae are normal.  Head: Atraumatic. Nose: No congestion/rhinnorhea. Mouth/Throat: Mucous membranes are moist.   Neck: No stridor. Painless ROM.  Cardiovascular: Normal rate, regular rhythm. Grossly normal heart sounds.   Good peripheral circulation. Respiratory: Normal respiratory effort.  No retractions. Lungs CTAB. Gastrointestinal: Soft and nontender. No distention. No abdominal bruits. No CVA tenderness. Genitourinary: deferred Musculoskeletal: No lower extremity tenderness nor edema.  No joint effusions. Neurologic:  Normal speech and language. No gross focal neurologic deficits are appreciated. No facial droop Skin:  Skin is warm, dry and intact. No rash noted. Psychiatric: Mood and affect are normal. Speech and behavior are normal.  ____________________________________________   LABS (all labs ordered are listed, but only abnormal results are displayed)  Results for orders placed or performed during the hospital encounter of 05/11/18 (from the past 24 hour(s))  Lipase, blood     Status: None   Collection Time: 05/11/18  5:47 PM  Result Value Ref Range   Lipase 35 11 - 51 U/L  Comprehensive metabolic panel     Status: Abnormal   Collection Time: 05/11/18  5:47 PM  Result Value Ref Range   Sodium 138 135 - 145 mmol/L   Potassium 3.2 (L) 3.5 - 5.1 mmol/L   Chloride 100 98 - 111 mmol/L   CO2 27 22 - 32 mmol/L   Glucose, Bld 149 (H) 70 - 99 mg/dL   BUN 15 8 - 23 mg/dL   Creatinine, Ser 0.74 0.44 - 1.00 mg/dL   Calcium 9.3 8.9 - 10.3 mg/dL   Total Protein 7.6 6.5 - 8.1 g/dL   Albumin 3.9 3.5 - 5.0 g/dL   AST 21 15 - 41 U/L   ALT 11 0 - 44 U/L   Alkaline Phosphatase 56 38 - 126 U/L   Total Bilirubin 0.4 0.3 - 1.2 mg/dL   GFR calc non Af Amer >60 >60 mL/min   GFR calc Af Amer >60 >60 mL/min   Anion gap 11 5 - 15  CBC     Status: Abnormal   Collection Time: 05/11/18  5:47 PM  Result Value Ref Range   WBC 6.7 3.6 - 11.0 K/uL   RBC 4.18 3.80 - 5.20 MIL/uL   Hemoglobin 11.8 (L) 12.0 - 16.0 g/dL   HCT 35.2 35.0 - 47.0 %   MCV 84.2 80.0 - 100.0 fL   MCH 28.3 26.0 - 34.0 pg   MCHC 33.6 32.0 - 36.0 g/dL   RDW 13.0 11.5 - 14.5 %   Platelets 329 150 - 440 K/uL  Urinalysis, Complete w  Microscopic     Status: Abnormal   Collection Time: 05/11/18  5:47 PM  Result Value Ref Range   Color, Urine YELLOW (A) YELLOW   APPearance CLEAR (A) CLEAR   Specific Gravity, Urine 1.009 1.005 - 1.030   pH 6.0 5.0 - 8.0   Glucose, UA NEGATIVE NEGATIVE mg/dL   Hgb urine dipstick NEGATIVE NEGATIVE   Bilirubin Urine NEGATIVE NEGATIVE   Ketones, ur NEGATIVE NEGATIVE mg/dL   Protein, ur NEGATIVE NEGATIVE mg/dL   Nitrite NEGATIVE NEGATIVE   Leukocytes, UA NEGATIVE NEGATIVE   WBC, UA 0-5 0 - 5 WBC/hpf   Bacteria,  UA NONE SEEN NONE SEEN   Squamous Epithelial / LPF 0-5 0 - 5   ____________________________________________  EKG My review and personal interpretation at Time: 20:50   Indication: epigastric discomfort  Rate: 80  Rhythm: sinus Axis: normal Other: normal intervals, no stemi ____________________________________________  RADIOLOGY  I personally reviewed all radiographic images ordered to evaluate for the above acute complaints and reviewed radiology reports and findings.  These findings were personally discussed with the patient.  Please see medical record for radiology report.  ____________________________________________   PROCEDURES  Procedure(s) performed:  Procedures    Critical Care performed: no ____________________________________________   INITIAL IMPRESSION / ASSESSMENT AND PLAN / ED COURSE  Pertinent labs & imaging results that were available during my care of the patient were reviewed by me and considered in my medical decision making (see chart for details).   DDX: Food bolus impaction, esophageal stenosis, esophageal dysmotility, cholelithiasis, ACS, perforation, SBO, pneumonia  Ladaysha Soutar is a 82 y.o. who presents to the ED with symptoms as described above.  Patient afebrile and hemodynamically stable.  Her abdominal exam is soft and benign.  She is currently asymptomatic and is tolerating liquids.  EKG shows no evidence of acute ischemia.  Is not  clinically consistent with cardiac etiology.  I do suspect some component of worsening reflux esophagitis.  No evidence of Boerhaave's.  Patient given GI cocktail and is able to tolerate oral hydration.  At this point I do believe that she is stable and appropriate for outpatient follow-up with GI.      As part of my medical decision making, I reviewed the following data within the [facility] notes reviewed and incorporated, Labs reviewed, notes from prior ED visits and [place] Controlled Substance Database   ____________________________________________   FINAL CLINICAL IMPRESSION(S) / ED DIAGNOSES  Final diagnoses:  Dysphagia, unspecified type      NEW MEDICATIONS STARTED DURING THIS VISIT:  Current Discharge Medication List       Note:  This document was prepared using Dragon voice recognition software and may include unintentional dictation errors.    Merlyn Lot, MD 05/11/18 2111

## 2018-05-11 NOTE — ED Triage Notes (Addendum)
Pt arrived via POV with family with reports of problems with swallowing and keeping food down for several years.  PT states sxs getting worse recently.  Pt states she has a hx of having esophagus stretched out of state and was told she may need to have it again.   Pt also states she has hx of acid reflux but states the medication is not helping.

## 2018-05-11 NOTE — ED Notes (Signed)
Pt ambulatory to and from bathroom with steady gait. ABCs intact. NAD

## 2018-05-11 NOTE — ED Notes (Signed)
Pt to and from XR. ABCs intact. NAD 

## 2018-06-18 ENCOUNTER — Ambulatory Visit: Payer: Medicare Other | Admitting: Gastroenterology

## 2018-06-18 ENCOUNTER — Encounter: Payer: Self-pay | Admitting: Gastroenterology

## 2018-06-18 DIAGNOSIS — R1310 Dysphagia, unspecified: Secondary | ICD-10-CM

## 2018-06-18 NOTE — Progress Notes (Deleted)
She has been referred from the emergency room when she presented to the ER on 05/11/2018 with vomiting and apparently at the time of the visit she mentioned that she had the same sensation previously when she required an esophageal dilation for a narrowing.  She was discharged with follow-up as an outpatient with GI.  She has not seen a GI in the area recently.   For nausea vomiting.  Plan 1.  Continue PPI 2.  EGD  I have discussed alternative options, risks & benefits,  which include, but are not limited to, bleeding, infection, perforation,respiratory complication & drug reaction.  The patient agrees with this plan & written consent will be obtained.

## 2018-06-19 ENCOUNTER — Ambulatory Visit: Payer: Medicare Other | Admitting: Gastroenterology

## 2018-06-19 ENCOUNTER — Encounter (INDEPENDENT_AMBULATORY_CARE_PROVIDER_SITE_OTHER): Payer: Self-pay

## 2018-06-19 ENCOUNTER — Ambulatory Visit (INDEPENDENT_AMBULATORY_CARE_PROVIDER_SITE_OTHER): Payer: Medicare Other | Admitting: Gastroenterology

## 2018-06-19 ENCOUNTER — Encounter: Payer: Self-pay | Admitting: Gastroenterology

## 2018-06-19 ENCOUNTER — Other Ambulatory Visit: Payer: Self-pay

## 2018-06-19 VITALS — BP 154/87 | HR 82 | Ht 63.0 in | Wt 145.8 lb

## 2018-06-19 DIAGNOSIS — R1310 Dysphagia, unspecified: Secondary | ICD-10-CM | POA: Diagnosis not present

## 2018-06-19 NOTE — Progress Notes (Signed)
Heather Bellows MD, MRCP(U.K) [address]  [place]  [place], [place] [address]  Main: [phone]  Fax: [phone]   Gastroenterology Consultation  Referring Provider:     Samara Deist, MD Primary Care Physician:  Samara Deist, MD Primary Gastroenterologist:  Dr. Jonathon Small  Reason for Consultation:     Dysphagia         HPI:   Heather Small is a 82 y.o. y/o female referred for consultation & management  by Dr. Samara Deist, MD.     Patient is here today to see me as a ER follow-up.  She presented to the ER on 05/11/2018 with nominal pain followed by vomiting after she eats something hard.  There was concern for some issues with dysphagia and hence was referred to see me in GI.   She has had EGD x2 many years back , cant recall. 19 old GI left.     Dysphagia: Onset and any progression:  Year back  Frequency: occasionally  Foods affected : mostly like meat  Prior episodes of impaction: almost but never did  History of asthma/allergy : nop History of heartburn/Reflux : yes  Weight loss/weight gain : doesn't think   Prior EGD: yes  PPI/H2 blocker use : Prilosec 20 mg - helps at times.   She is not on any blood thinners.     Past Medical History:  Diagnosis Date  . Diabetes mellitus without complication ([place])   . Hyperlipemia   . Hypertension   . Thyroid disease     Past Surgical History:  Procedure Laterality Date  . ESOPHAGEAL DILATION      Prior to Admission medications   Medication Sig Start Date End Date Taking? Authorizing Provider  AZOPT 1 % ophthalmic suspension Place 1 drop into both eyes 2 (two) times daily. 04/11/15  Yes [provider]  brimonidine (ALPHAGAN) 0.15 % ophthalmic solution Place 1 drop into both eyes 2 (two) times daily. 04/11/15  Yes [provider]  levothyroxine (SYNTHROID, LEVOTHROID) 88 MCG tablet Take 1 tablet by mouth daily. 06/05/15  Yes [provider]    losartan-hydrochlorothiazide (HYZAAR) 100-25 MG tablet losartan 100 mg-hydrochlorothiazide 25 mg tablet   Yes [provider]  LUMIGAN 0.01 % SOLN Place 1 drop into both eyes at bedtime. 04/11/15  Yes [provider]  metFORMIN (GLUCOPHAGE) 500 MG tablet Take 1 tablet by mouth 2 (two) times daily. 06/15/15  Yes [provider]  omeprazole (PRILOSEC) 20 MG capsule Take 2 capsules (40 mg total) by mouth 2 (two) times daily before a meal. 05/11/18  Yes Merlyn Lot, MD  simvastatin (ZOCOR) 20 MG tablet Take 1 tablet by mouth at bedtime. 06/15/15  Yes [provider]  timolol (TIMOPTIC) 0.5 % ophthalmic solution Place 1 drop into both eyes 2 (two) times daily. 04/11/15  Yes [provider]  traMADol (ULTRAM) 50 MG tablet Take 1 tablet (50 mg total) by mouth every 12 (twelve) hours as needed. 11/30/16  Yes Sable Feil, PA-C  benzonatate (TESSALON) 200 MG capsule Take 1 capsule (200 mg total) by mouth every 8 (eight) hours. Patient not taking: Reported on 06/19/2018 03/19/16   Lorin Picket, PA-C  cephALEXin (KEFLEX) 500 MG capsule Take 1 capsule (500 mg total) by mouth 2 (two) times daily. Patient not taking: Reported on 06/19/2018 02/19/17   Fritzi Mandes, MD  Multiple Vitamin (MULTIVITAMIN) tablet Take 1 tablet by mouth daily.    [provider]  valsartan-hydrochlorothiazide (DIOVAN-HCT) 320-25 MG  tablet Take 1 tablet by mouth daily. 04/13/15   [provider]    Family History  Problem Relation Age of Onset  . Diabetes Other   . Heart Problems Mother   . Cancer Father      Social History   Tobacco Use  . Smoking status: Never Smoker  . Smokeless tobacco: Never Used  Substance Use Topics  . Alcohol use: No  . Drug use: No    Allergies as of 06/19/2018 - Review Complete 06/19/2018  Allergen Reaction Noted  . Codeine Anaphylaxis and Other (See Comments) 07/23/2010    Review of Systems:    All systems reviewed and  negative except where noted in HPI.   Physical Exam:  BP (!) 154/87   Pulse 82   Ht 5\' 3"  (1.6 m)   Wt 145 lb 12.8 oz (66.1 kg)   BMI 25.83 kg/m  No LMP recorded. Patient is postmenopausal. Psych:  Alert and cooperative. Normal mood and affect. General:   Alert,  Well-developed, well-nourished, pleasant and cooperative in NAD Head:  Normocephalic and atraumatic. Eyes:  Sclera clear, no icterus.   Conjunctiva pink. Ears:  Normal auditory acuity. Nose:  No deformity, discharge, or lesions. Mouth:  No deformity or lesions,oropharynx pink & moist. Neck:  Supple; no masses or thyromegaly. Lungs:  Respirations even and unlabored.  Clear throughout to auscultation.   No wheezes, crackles, or rhonchi. No acute distress. Heart:  Regular rate and rhythm; no murmurs, clicks, rubs, or gallops. Abdomen:  Normal bowel sounds.  No bruits.  Soft, non-tender and non-distended without masses, hepatosplenomegaly or hernias noted.  No guarding or rebound tenderness.    Msk:  Symmetrical without gross deformities. Good, equal movement & strength bilaterally. Pulses:  Normal pulses noted. Extremities:  No clubbing or edema.  No cyanosis. Neurologic:  Alert and oriented x3;  grossly normal neurologically. Skin:  Intact without significant lesions or rashes. No jaundice. Lymph Nodes:  No significant cervical adenopathy. Psych:  Alert and cooperative. Normal mood and affect.  Imaging Studies: No results found.  Assessment and Plan:   Heather Small is a 82 y.o. y/o female has been referred for concerns of possible dysphagia. Plan 1 EGD +/- dilation   I have discussed alternative options, risks & benefits,  which include, but are not limited to, bleeding, infection, perforation,respiratory complication & drug reaction.  The patient agrees with this plan & written consent will be obtained.    Follow up in 6 weeks   Dr Heather Bellows MD,MRCP(U.K)

## 2018-06-23 ENCOUNTER — Ambulatory Visit: Payer: Medicare Other | Admitting: Gastroenterology

## 2018-06-23 ENCOUNTER — Encounter

## 2018-07-13 ENCOUNTER — Ambulatory Visit: Payer: Medicare Other | Admitting: Anesthesiology

## 2018-07-13 ENCOUNTER — Other Ambulatory Visit: Payer: Self-pay

## 2018-07-13 ENCOUNTER — Ambulatory Visit
Admission: RE | Admit: 2018-07-13 | Discharge: 2018-07-13 | Disposition: A | Discharge status: Home / Self Care | Payer: Medicare Other | Source: Ambulatory Visit | Attending: Gastroenterology | Admitting: Gastroenterology

## 2018-07-13 ENCOUNTER — Encounter
Admission: RE | Disposition: A | Discharge status: Home / Self Care | Payer: Self-pay | Source: Ambulatory Visit | Attending: Gastroenterology

## 2018-07-13 ENCOUNTER — Encounter: Payer: Self-pay | Admitting: *Deleted

## 2018-07-13 DIAGNOSIS — E119 Type 2 diabetes mellitus without complications: Secondary | ICD-10-CM | POA: Insufficient documentation

## 2018-07-13 DIAGNOSIS — K449 Diaphragmatic hernia without obstruction or gangrene: Secondary | ICD-10-CM | POA: Diagnosis not present

## 2018-07-13 DIAGNOSIS — E039 Hypothyroidism, unspecified: Secondary | ICD-10-CM | POA: Diagnosis not present

## 2018-07-13 DIAGNOSIS — I10 Essential (primary) hypertension: Secondary | ICD-10-CM | POA: Diagnosis not present

## 2018-07-13 DIAGNOSIS — E785 Hyperlipidemia, unspecified: Secondary | ICD-10-CM | POA: Diagnosis not present

## 2018-07-13 DIAGNOSIS — Z7984 Long term (current) use of oral hypoglycemic drugs: Secondary | ICD-10-CM | POA: Insufficient documentation

## 2018-07-13 DIAGNOSIS — K222 Esophageal obstruction: Secondary | ICD-10-CM | POA: Diagnosis not present

## 2018-07-13 DIAGNOSIS — R1310 Dysphagia, unspecified: Secondary | ICD-10-CM | POA: Diagnosis present

## 2018-07-13 HISTORY — PX: ESOPHAGOGASTRODUODENOSCOPY (EGD) WITH PROPOFOL: SHX5813

## 2018-07-13 HISTORY — DX: Hypothyroidism, unspecified: E03.9

## 2018-07-13 LAB — GLUCOSE, CAPILLARY: Glucose-Capillary: 111 mg/dL — ABNORMAL HIGH (ref 70–99)

## 2018-07-13 SURGERY — ESOPHAGOGASTRODUODENOSCOPY (EGD) WITH PROPOFOL
Anesthesia: General

## 2018-07-13 MED ORDER — SODIUM CHLORIDE 0.9 % IV SOLN
INTRAVENOUS | Status: DC
  Administered 2018-07-13: 09:00:00 via INTRAVENOUS

## 2018-07-13 MED ORDER — OMEPRAZOLE 40 MG PO CPDR: 40.0000 mg | DELAYED_RELEASE_CAPSULE | Freq: Two times a day (BID) | ORAL | 0 refills | Status: DC

## 2018-07-13 MED ORDER — LABETALOL HCL 5 MG/ML IV SOLN
INTRAVENOUS | Status: AC
  Filled 2018-07-13: qty 4

## 2018-07-13 MED ORDER — PROPOFOL 10 MG/ML IV BOLUS
INTRAVENOUS | Status: AC
  Filled 2018-07-13: qty 20

## 2018-07-13 MED ORDER — PROPOFOL 10 MG/ML IV BOLUS
INTRAVENOUS | Status: DC | PRN
  Administered 2018-07-13: 20 mg via INTRAVENOUS
  Administered 2018-07-13: 80 mg via INTRAVENOUS
  Administered 2018-07-13: 20 mg via INTRAVENOUS
  Administered 2018-07-13: 10 mg via INTRAVENOUS
  Administered 2018-07-13: 20 mg via INTRAVENOUS

## 2018-07-13 MED ORDER — LABETALOL HCL 5 MG/ML IV SOLN
INTRAVENOUS | Status: DC | PRN
  Administered 2018-07-13: 10 mg via INTRAVENOUS

## 2018-07-13 NOTE — Anesthesia Postprocedure Evaluation (Signed)
Anesthesia Post Note  Patient: Heather Small  Procedure(s) Performed: ESOPHAGOGASTRODUODENOSCOPY (EGD) WITH PROPOFOL (N/A )  Patient location during evaluation: PACU Anesthesia Type: General Level of consciousness: awake and alert Pain management: pain level controlled Vital Signs Assessment: post-procedure vital signs reviewed and stable Respiratory status: spontaneous breathing, nonlabored ventilation, respiratory function stable and patient connected to nasal cannula oxygen Cardiovascular status: blood pressure returned to baseline and stable Postop Assessment: no apparent nausea or vomiting Anesthetic complications: no     Last Vitals:  Vitals:   07/13/18 0827 07/13/18 0908  BP: (!) 206/87 (!) 150/74  Pulse: 89   Resp: 18   Temp: 36.7 C (!) 36.1 C  SpO2: 98%     Last Pain:  Vitals:   07/13/18 0934  TempSrc:   PainSc: 0-No pain                 Durenda Hurt

## 2018-07-13 NOTE — Anesthesia Post-op Follow-up Note (Signed)
Anesthesia QCDR form completed.        

## 2018-07-13 NOTE — Transfer of Care (Signed)
Immediate Anesthesia Transfer of Care Note  Patient: Laurana Magistro  Procedure(s) Performed: ESOPHAGOGASTRODUODENOSCOPY (EGD) WITH PROPOFOL (N/A )  Patient Location: Endoscopy Unit  Anesthesia Type:General  Level of Consciousness: drowsy and patient cooperative  Airway & Oxygen Therapy: Patient Spontanous Breathing and Patient connected to nasal cannula oxygen  Post-op Assessment: Report given to RN and Post -op Vital signs reviewed and stable  Post vital signs: Reviewed and stable  Last Vitals:  Vitals Value Taken Time  BP 150/74 07/13/2018  9:09 AM  Temp 36.1 C 07/13/2018  9:08 AM  Pulse 90 07/13/2018  9:09 AM  Resp 24 07/13/2018  9:09 AM  SpO2 99 % 07/13/2018  9:09 AM  Vitals shown include unvalidated device data.  Last Pain:  Vitals:   07/13/18 0908  TempSrc: Tympanic  PainSc: 0-No pain         Complications: No apparent anesthesia complications

## 2018-07-13 NOTE — Op Note (Signed)
[facility] Patient Name: Heather Small Procedure Date: 07/13/2018 8:30 AM MRN: [id number] Account #: [id number] Date of Birth: 1936/04/04 Admit Type: Outpatient Age: 82 Room: [facility] ENDO ROOM 4 Gender: Female Note Status: Finalized Procedure:            Upper GI endoscopy Indications:          Dysphagia Providers:            Jonathon Bellows MD, MD Referring MD:         No Local Md, MD (Referring MD) Medicines:            Monitored Anesthesia Care Complications:        No immediate complications. Procedure:            Pre-Anesthesia Assessment:                       - Prior to the procedure, a History and Physical was                        performed, and patient medications, allergies and                        sensitivities were reviewed. The patient's tolerance of                        previous anesthesia was reviewed.                       - The risks and benefits of the procedure and the                        sedation options and risks were discussed with the                        patient. All questions were answered and informed                        consent was obtained.                       - ASA Grade Assessment: III - A patient with severe                        systemic disease.                       After obtaining informed consent, the endoscope was                        passed under direct vision. Throughout the procedure,                        the patient's blood pressure, pulse, and oxygen                        saturations were monitored continuously. The Endoscope                        was introduced through the mouth, and advanced to the  third part of duodenum. The upper GI endoscopy was                        accomplished with ease. The patient tolerated the                        procedure well. Findings:      One benign-appearing, intrinsic severe (stenosis; an endoscope cannot       pass) stenosis  was found at the gastroesophageal junction. This stenosis       measured 9 mm (inner diameter) x less than one cm (in length). The       stenosis was traversed after downsizing scope and dilating. A TTS       dilator was passed through the scope. Dilation with an 03-20-09 mm balloon       dilator was performed to 10 mm. The dilation site was examined following       endoscope reinsertion and showed moderate improvement in luminal       narrowing.      A large hiatal hernia was present.      The stomach was normal.      The cardia and gastric fundus were normal on retroflexion.      The examined duodenum was normal. Impression:           - Benign-appearing esophageal stenosis. Dilated.                       - Large hiatal hernia.                       - Normal stomach.                       - Normal examined duodenum.                       - No specimens collected. Recommendation:       - Discharge patient to home (with escort).                       - Mechanical soft diet.                       - Continue present medications.                       - Suggest omeprazole 40 mg BID                       - Repeat upper endoscopy in 2 weeks for retreatment.                       - Return to GI office in 6 weeks. Procedure Code(s):    --- Professional ---                       [phone], Esophagogastroduodenoscopy, flexible, transoral;                        with transendoscopic balloon dilation of esophagus                        (less than 30 mm diameter) Diagnosis Code(s):    ---  Professional ---                       K22.2, Esophageal obstruction                       K44.9, Diaphragmatic hernia without obstruction or                        gangrene                       R13.10, Dysphagia, unspecified CPT copyright 2018 [employer]. All rights reserved. The codes documented in this report are preliminary and upon coder review may  be revised to meet current compliance  requirements. Jonathon Bellows, MD Jonathon Bellows MD, MD 07/13/2018 9:03:40 AM This report has been signed electronically. Number of Addenda: 0 Note Initiated On: 07/13/2018 8:30 AM      [facility]

## 2018-07-13 NOTE — H&P (Signed)
Jonathon Bellows, MD [address], [place], [place], [place], [address] [address], [place], [place], [place], [address] Phone: [phone]  Fax: [phone]  Primary Care Physician:  Samara Deist, MD   Pre-Procedure History & Physical: HPI:  Heather Small is a 82 y.o. female is here for an endoscopy    Past Medical History:  Diagnosis Date  . Diabetes mellitus without complication ([place])   . Hyperlipemia   . Hypertension   . Hypothyroidism   . Thyroid disease     Past Surgical History:  Procedure Laterality Date  . COLONOSCOPY    . ESOPHAGEAL DILATION      Prior to Admission medications   Medication Sig Start Date End Date Taking? Authorizing Provider  AZOPT 1 % ophthalmic suspension Place 1 drop into both eyes 2 (two) times daily. 04/11/15  Yes [provider]  brimonidine (ALPHAGAN) 0.15 % ophthalmic solution Place 1 drop into both eyes 2 (two) times daily. 04/11/15  Yes [provider]  levothyroxine (SYNTHROID, LEVOTHROID) 88 MCG tablet Take 1 tablet by mouth daily. 06/05/15  Yes [provider]  losartan-hydrochlorothiazide (HYZAAR) 100-25 MG tablet losartan 100 mg-hydrochlorothiazide 25 mg tablet   Yes [provider]  LUMIGAN 0.01 % SOLN Place 1 drop into both eyes at bedtime. 04/11/15  Yes [provider]  metFORMIN (GLUCOPHAGE) 500 MG tablet Take 1 tablet by mouth 2 (two) times daily. 06/15/15  Yes [provider]  Multiple Vitamin (MULTIVITAMIN) tablet Take 1 tablet by mouth daily.   Yes [provider]  omeprazole (PRILOSEC) 20 MG capsule Take 2 capsules (40 mg total) by mouth 2 (two) times daily before a meal. 05/11/18  Yes Merlyn Lot, MD  simvastatin (ZOCOR) 20 MG tablet Take 1 tablet by mouth at bedtime. 06/15/15  Yes [provider]  timolol (TIMOPTIC) 0.5 % ophthalmic solution Place 1 drop into both eyes 2 (two) times daily. 04/11/15  Yes [provider]  traMADol  (ULTRAM) 50 MG tablet Take 1 tablet (50 mg total) by mouth every 12 (twelve) hours as needed. 11/30/16  Yes Sable Feil, PA-C  valsartan-hydrochlorothiazide (DIOVAN-HCT) 320-25 MG tablet Take 1 tablet by mouth daily. 04/13/15  Yes [provider]  benzonatate (TESSALON) 200 MG capsule Take 1 capsule (200 mg total) by mouth every 8 (eight) hours. Patient not taking: Reported on 06/19/2018 03/19/16   Lorin Picket, PA-C  cephALEXin (KEFLEX) 500 MG capsule Take 1 capsule (500 mg total) by mouth 2 (two) times daily. Patient not taking: Reported on 06/19/2018 02/19/17   Fritzi Mandes, MD    Allergies as of 06/19/2018 - Review Complete 06/19/2018  Allergen Reaction Noted  . Codeine Anaphylaxis and Other (See Comments) 07/23/2010    Family History  Problem Relation Age of Onset  . Diabetes Other   . Heart Problems Mother   . Cancer Father     Social History   Socioeconomic History  . Marital status: Widowed    Spouse name: Not on file  . Number of children: Not on file  . Years of education: Not on file  . Highest education level: Not on file  Occupational History  . Not on file  Social Needs  . Financial resource strain: Not on file  . Food insecurity:    Worry: Not on file    Inability: Not on file  . Transportation needs:    Medical: Not on file    Non-medical: Not on file  Tobacco Use  .  Smoking status: Never Smoker  . Smokeless tobacco: Never Used  Substance and Sexual Activity  . Alcohol use: No  . Drug use: No  . Sexual activity: Not on file  Lifestyle  . Physical activity:    Days per week: Not on file    Minutes per session: Not on file  . Stress: Not on file  Relationships  . Social connections:    Talks on phone: Not on file    Gets together: Not on file    Attends religious service: Not on file    Active member of club or organization: Not on file    Attends meetings of clubs or organizations: Not on file    Relationship status: Not on file  .  Intimate partner violence:    Fear of current or ex partner: Not on file    Emotionally abused: Not on file    Physically abused: Not on file    Forced sexual activity: Not on file  Other Topics Concern  . Not on file  Social History Narrative  . Not on file    Review of Systems: See HPI, otherwise negative ROS  Physical Exam: BP (!) 206/87   Pulse 89   Temp 98 F (36.7 C) (Tympanic)   Resp 18   Ht 5\' 2"  (1.575 m)   Wt 65.8 kg   SpO2 98%   BMI 26.52 kg/m  General:   Alert,  pleasant and cooperative in NAD Head:  Normocephalic and atraumatic. Neck:  Supple; no masses or thyromegaly. Lungs:  Clear throughout to auscultation, normal respiratory effort.    Heart:  +S1, +S2, Regular rate and rhythm, No edema. Abdomen:  Soft, nontender and nondistended. Normal bowel sounds, without guarding, and without rebound.   Neurologic:  Alert and  oriented x4;  grossly normal neurologically.  Impression/Plan: Heather Small is here for an endoscopy  to be performed for  evaluation of dysphagia    Risks, benefits, limitations, and alternatives regarding endoscopy and dilation have been reviewed with the patient.  Questions have been answered.  All parties agreeable.   Jonathon Bellows, MD  07/13/2018, 8:30 AM

## 2018-07-13 NOTE — Anesthesia Preprocedure Evaluation (Addendum)
Anesthesia Evaluation  Patient identified by MRN, date of birth, ID band Patient awake    Reviewed: Allergy & Precautions, H&P , NPO status , Patient's Chart, lab work & pertinent test results  Airway Mallampati: III       Dental  (+) Upper Dentures   Pulmonary neg pulmonary ROS, neg shortness of breath, neg COPD, neg recent URI,           Cardiovascular hypertension,      Neuro/Psych CVA (Pt denies) negative psych ROS   GI/Hepatic negative GI ROS, Neg liver ROS,   Endo/Other  diabetesHypothyroidism   Renal/GU negative Renal ROS  negative genitourinary   Musculoskeletal   Abdominal   Peds  Hematology negative hematology ROS (+)   Anesthesia Other Findings Past Medical History: No date: Diabetes mellitus without complication ([facility]) No date: Hyperlipemia No date: Hypertension No date: Hypothyroidism No date: Thyroid disease  Past Surgical History: No date: COLONOSCOPY No date: ESOPHAGEAL DILATION  BMI    Body Mass Index:  26.52 kg/m     Reproductive/Obstetrics negative OB ROS                            Anesthesia Physical Anesthesia Plan  ASA: III  Anesthesia Plan: General   Post-op Pain Management:    Induction:   PONV Risk Score and Plan: Propofol infusion and TIVA  Airway Management Planned: Natural Airway and Nasal Cannula  Additional Equipment:   Intra-op Plan:   Post-operative Plan:   Informed Consent: I have reviewed the patients History and Physical, chart, labs and discussed the procedure including the risks, benefits and alternatives for the proposed anesthesia with the patient or authorized representative who has indicated his/her understanding and acceptance.   Dental Advisory Given  Plan Discussed with: Anesthesiologist and CRNA  Anesthesia Plan Comments:        Anesthesia Quick Evaluation

## 2018-07-15 ENCOUNTER — Encounter: Payer: Self-pay | Admitting: Gastroenterology

## 2018-07-23 ENCOUNTER — Telehealth: Payer: Self-pay | Admitting: Gastroenterology

## 2018-07-23 NOTE — Telephone Encounter (Signed)
Pt has medication questions. Would like a call from the doctor or nurse.

## 2018-07-24 NOTE — Telephone Encounter (Signed)
Pt is calling to speak to nurse about having another procedure done due to an incomplete one

## 2018-07-27 ENCOUNTER — Other Ambulatory Visit: Payer: Self-pay

## 2018-07-27 DIAGNOSIS — R1310 Dysphagia, unspecified: Secondary | ICD-10-CM

## 2018-07-27 DIAGNOSIS — K222 Esophageal obstruction: Secondary | ICD-10-CM

## 2018-07-27 NOTE — Telephone Encounter (Signed)
Spoke with pt regarding scheduling a repeat upper endoscopy procedure as instructed by Dr. Vicente Males. Pt procedure has been scheduled and pt is aware of prior prep instructions.

## 2018-07-30 ENCOUNTER — Encounter: Payer: Self-pay | Admitting: *Deleted

## 2018-07-30 ENCOUNTER — Ambulatory Visit: Payer: Medicare Other | Admitting: Anesthesiology

## 2018-07-30 ENCOUNTER — Ambulatory Visit
Admission: RE | Admit: 2018-07-30 | Discharge: 2018-07-30 | Disposition: A | Discharge status: Home / Self Care | Payer: Medicare Other | Attending: Gastroenterology | Admitting: Gastroenterology

## 2018-07-30 ENCOUNTER — Encounter
Admission: RE | Disposition: A | Discharge status: Home / Self Care | Payer: Self-pay | Source: Home / Self Care | Attending: Gastroenterology

## 2018-07-30 DIAGNOSIS — E785 Hyperlipidemia, unspecified: Secondary | ICD-10-CM | POA: Diagnosis not present

## 2018-07-30 DIAGNOSIS — R1310 Dysphagia, unspecified: Secondary | ICD-10-CM | POA: Diagnosis present

## 2018-07-30 DIAGNOSIS — I10 Essential (primary) hypertension: Secondary | ICD-10-CM | POA: Insufficient documentation

## 2018-07-30 DIAGNOSIS — E039 Hypothyroidism, unspecified: Secondary | ICD-10-CM | POA: Diagnosis not present

## 2018-07-30 DIAGNOSIS — Z7984 Long term (current) use of oral hypoglycemic drugs: Secondary | ICD-10-CM | POA: Insufficient documentation

## 2018-07-30 DIAGNOSIS — Z885 Allergy status to narcotic agent status: Secondary | ICD-10-CM | POA: Insufficient documentation

## 2018-07-30 DIAGNOSIS — E119 Type 2 diabetes mellitus without complications: Secondary | ICD-10-CM | POA: Diagnosis not present

## 2018-07-30 DIAGNOSIS — K222 Esophageal obstruction: Secondary | ICD-10-CM | POA: Diagnosis not present

## 2018-07-30 DIAGNOSIS — Z79899 Other long term (current) drug therapy: Secondary | ICD-10-CM | POA: Insufficient documentation

## 2018-07-30 DIAGNOSIS — K449 Diaphragmatic hernia without obstruction or gangrene: Secondary | ICD-10-CM | POA: Diagnosis not present

## 2018-07-30 DIAGNOSIS — Z809 Family history of malignant neoplasm, unspecified: Secondary | ICD-10-CM | POA: Diagnosis not present

## 2018-07-30 DIAGNOSIS — Z833 Family history of diabetes mellitus: Secondary | ICD-10-CM | POA: Insufficient documentation

## 2018-07-30 DIAGNOSIS — Z8249 Family history of ischemic heart disease and other diseases of the circulatory system: Secondary | ICD-10-CM | POA: Insufficient documentation

## 2018-07-30 HISTORY — PX: ESOPHAGOGASTRODUODENOSCOPY (EGD) WITH PROPOFOL: SHX5813

## 2018-07-30 SURGERY — ESOPHAGOGASTRODUODENOSCOPY (EGD) WITH PROPOFOL
Anesthesia: General

## 2018-07-30 MED ORDER — LIDOCAINE HCL (CARDIAC) PF 100 MG/5ML IV SOSY
PREFILLED_SYRINGE | INTRAVENOUS | Status: DC | PRN
  Administered 2018-07-30: 100 mg via INTRAVENOUS

## 2018-07-30 MED ORDER — SODIUM CHLORIDE 0.9 % IV SOLN
INTRAVENOUS | Status: DC
  Administered 2018-07-30: 1000 mL via INTRAVENOUS

## 2018-07-30 MED ORDER — PROPOFOL 10 MG/ML IV BOLUS
INTRAVENOUS | Status: DC | PRN
  Administered 2018-07-30 (×2): 20 mg via INTRAVENOUS
  Administered 2018-07-30: 80 mg via INTRAVENOUS
  Administered 2018-07-30: 20 mg via INTRAVENOUS

## 2018-07-30 NOTE — Anesthesia Post-op Follow-up Note (Signed)
Anesthesia QCDR form completed.        

## 2018-07-30 NOTE — Anesthesia Postprocedure Evaluation (Signed)
Anesthesia Post Note  Patient: Heather Small  Procedure(s) Performed: ESOPHAGOGASTRODUODENOSCOPY (EGD) WITH PROPOFOL (N/A )  Patient location during evaluation: Endoscopy Anesthesia Type: General Level of consciousness: awake and alert Pain management: pain level controlled Vital Signs Assessment: post-procedure vital signs reviewed and stable Respiratory status: spontaneous breathing, nonlabored ventilation and respiratory function stable Cardiovascular status: blood pressure returned to baseline and stable Postop Assessment: no apparent nausea or vomiting Anesthetic complications: no     Last Vitals:  Vitals:   07/30/18 0940 07/30/18 0950  BP: (!) 177/81 (!) 163/85  Pulse: 86 83  Resp: 20 20  Temp:    SpO2: 96% 98%    Last Pain:  Vitals:   07/30/18 0930  TempSrc: Tympanic  PainSc:                  [name]

## 2018-07-30 NOTE — Op Note (Signed)
[facility] Patient Name: Heather Small Procedure Date: 07/30/2018 9:05 AM MRN: [id number] Account #: [id number] Date of Birth: 09/14/1935 Admit Type: Outpatient Age: 82 Room: [facility] ENDO ROOM 4 Gender: Female Note Status: Finalized Procedure:            Upper GI endoscopy Indications:          Dysphagia Providers:            Jonathon Bellows MD, MD Referring MD:         Hoyle Barr Medicines:            Monitored Anesthesia Care Complications:        No immediate complications. Procedure:            Pre-Anesthesia Assessment:                       - Prior to the procedure, a History and Physical was                        performed, and patient medications, allergies and                        sensitivities were reviewed. The patient's tolerance of                        previous anesthesia was reviewed.                       - The risks and benefits of the procedure and the                        sedation options and risks were discussed with the                        patient. All questions were answered and informed                        consent was obtained.                       - ASA Grade Assessment: III - A patient with severe                        systemic disease.                       After obtaining informed consent, the endoscope was                        passed under direct vision. Throughout the procedure,                        the patient's blood pressure, pulse, and oxygen                        saturations were monitored continuously. The Endoscope                        was introduced through the mouth, and advanced to the                        third  part of duodenum. The upper GI endoscopy was                        accomplished with ease. The patient tolerated the                        procedure well. Findings:      One benign-appearing, intrinsic severe (stenosis; an endoscope cannot       pass) stenosis was found at  the gastroesophageal junction. This stenosis       measured 9 mm (inner diameter) x less than one cm (in length). The       stenosis was traversed after dilation. A TTS dilator was passed through       the scope. Dilation with an 03-20-09 mm balloon and a 05-23-11 mm balloon       dilator was performed to 11 mm. The dilation site was examined and       showed moderate improvement in luminal narrowing.      The stomach was normal.      The examined duodenum was normal.      A medium-sized hiatal hernia was present.      The cardia and gastric fundus were normal on retroflexion. Impression:           - Benign-appearing esophageal stenosis. Dilated.                       - Normal stomach.                       - Normal examined duodenum.                       - Medium-sized hiatal hernia.                       - No specimens collected. Recommendation:       - Discharge patient to home (with escort).                       - Resume previous diet.                       - Continue present medications.                       - Repeat upper endoscopy in 2 weeks to evaluate the                        response to therapy.                       - Continue PPI Procedure Code(s):    --- Professional ---                       [phone], Esophagogastroduodenoscopy, flexible, transoral;                        with transendoscopic balloon dilation of esophagus                        (less than 30 mm diameter) Diagnosis Code(s):    --- Professional ---  K22.2, Esophageal obstruction                       K44.9, Diaphragmatic hernia without obstruction or                        gangrene                       R13.10, Dysphagia, unspecified CPT copyright 2018 American Medical Association. All rights reserved. The codes documented in this report are preliminary and upon coder review may  be revised to meet current compliance requirements. Jonathon Bellows, MD Jonathon Bellows MD, MD 07/30/2018 9:26:03  AM This report has been signed electronically. Number of Addenda: 0 Note Initiated On: 07/30/2018 9:05 AM      [facility]

## 2018-07-30 NOTE — Anesthesia Preprocedure Evaluation (Signed)
Anesthesia Evaluation  Patient identified by MRN, date of birth, ID band Patient awake    Reviewed: Allergy & Precautions, H&P , NPO status , reviewed documented beta blocker date and time   Airway Mallampati: III  TM Distance: >3 FB Neck ROM: full    Dental  (+) Upper Dentures   Pulmonary    Pulmonary exam normal        Cardiovascular hypertension, Normal cardiovascular exam     Neuro/Psych CVA    GI/Hepatic   Endo/Other  diabetesHypothyroidism   Renal/GU      Musculoskeletal   Abdominal   Peds  Hematology   Anesthesia Other Findings Past Medical History: No date: Diabetes mellitus without complication ([place]) No date: Hyperlipemia No date: Hypertension No date: Hypothyroidism No date: Thyroid disease  Past Surgical History: No date: COLONOSCOPY No date: ESOPHAGEAL DILATION 07/13/2018: ESOPHAGOGASTRODUODENOSCOPY (EGD) WITH PROPOFOL; N/A     Comment:  Procedure: ESOPHAGOGASTRODUODENOSCOPY (EGD) WITH               PROPOFOL;  Surgeon: Jonathon Bellows, MD;  Location[facility]               ENDOSCOPY;  Service: Gastroenterology;  Laterality: N/A; No date: MASTECTOMY  BMI    Body Mass Index:  25.51 kg/m      Reproductive/Obstetrics                             Anesthesia Physical Anesthesia Plan  ASA: III  Anesthesia Plan: General   Post-op Pain Management:    Induction: Intravenous  PONV Risk Score and Plan: 3 and Treatment may vary due to age or medical condition and TIVA  Airway Management Planned: Nasal Cannula and Natural Airway  Additional Equipment:   Intra-op Plan:   Post-operative Plan:   Informed Consent: I have reviewed the patients History and Physical, chart, labs and discussed the procedure including the risks, benefits and alternatives for the proposed anesthesia with the patient or authorized representative who has indicated his/her understanding and acceptance.    Dental Advisory Given  Plan Discussed with:   Anesthesia Plan Comments:         Anesthesia Quick Evaluation

## 2018-07-30 NOTE — H&P (Signed)
Jonathon Bellows, MD [address] [place], [place], [place], [address] [address], [place], [place], [place], [address] Phone: [phone]  Fax: [phone]  Primary Care Physician:  Samara Deist, MD   Pre-Procedure History & Physical: HPI:  Heather Small is a 82 y.o. female is here for an endoscopy    Past Medical History:  Diagnosis Date  . Diabetes mellitus without complication ([place])   . Hyperlipemia   . Hypertension   . Hypothyroidism   . Thyroid disease     Past Surgical History:  Procedure Laterality Date  . COLONOSCOPY    . ESOPHAGEAL DILATION    . ESOPHAGOGASTRODUODENOSCOPY (EGD) WITH PROPOFOL N/A 07/13/2018   Procedure: ESOPHAGOGASTRODUODENOSCOPY (EGD) WITH PROPOFOL;  Surgeon: Jonathon Bellows, MD;  Location: [facility] ENDOSCOPY;  Service: Gastroenterology;  Laterality: N/A;  . MASTECTOMY      Prior to Admission medications   Medication Sig Start Date End Date Taking? Authorizing Provider  brimonidine (ALPHAGAN) 0.15 % ophthalmic solution Place 1 drop into both eyes 2 (two) times daily. 04/11/15  Yes [provider]  levothyroxine (SYNTHROID, LEVOTHROID) 88 MCG tablet Take 1 tablet by mouth daily. 06/05/15  Yes [provider]  losartan-hydrochlorothiazide (HYZAAR) 100-25 MG tablet losartan 100 mg-hydrochlorothiazide 25 mg tablet   Yes [provider]  LUMIGAN 0.01 % SOLN Place 1 drop into both eyes at bedtime. 04/11/15  Yes [provider]  metFORMIN (GLUCOPHAGE) 500 MG tablet Take 1 tablet by mouth 2 (two) times daily. 06/15/15  Yes [provider]  Multiple Vitamin (MULTIVITAMIN) tablet Take 1 tablet by mouth daily.   Yes [provider]  omeprazole (PRILOSEC) 40 MG capsule Take 1 capsule (40 mg total) by mouth 2 (two) times daily. 07/13/18 10/11/18 Yes Jonathon Bellows, MD  simvastatin (ZOCOR) 20 MG tablet Take 1 tablet by mouth at bedtime. 06/15/15  Yes [provider]  timolol (TIMOPTIC) 0.5 % ophthalmic  solution Place 1 drop into both eyes 2 (two) times daily. 04/11/15  Yes [provider]  traMADol (ULTRAM) 50 MG tablet Take 1 tablet (50 mg total) by mouth every 12 (twelve) hours as needed. 11/30/16  Yes Sable Feil, PA-C  valsartan-hydrochlorothiazide (DIOVAN-HCT) 320-25 MG tablet Take 1 tablet by mouth daily. 04/13/15  Yes [provider]  AZOPT 1 % ophthalmic suspension Place 1 drop into both eyes 2 (two) times daily. 04/11/15   [provider]  benzonatate (TESSALON) 200 MG capsule Take 1 capsule (200 mg total) by mouth every 8 (eight) hours. Patient not taking: Reported on 06/19/2018 03/19/16   Lorin Picket, PA-C  cephALEXin (KEFLEX) 500 MG capsule Take 1 capsule (500 mg total) by mouth 2 (two) times daily. Patient not taking: Reported on 06/19/2018 02/19/17   Fritzi Mandes, MD    Allergies as of 07/27/2018 - Review Complete 07/13/2018  Allergen Reaction Noted  . Codeine Anaphylaxis and Other (See Comments) 07/23/2010    Family History  Problem Relation Age of Onset  . Diabetes Other   . Heart Problems Mother   . Cancer Father     Social History   Socioeconomic History  . Marital status: Widowed    Spouse name: Not on file  . Number of children: Not on file  . Years of education: Not on file  . Highest education level: Not on file  Occupational History  . Not on file  Social Needs  . Financial resource strain: Not on file  . Food insecurity:    Worry:  Not on file    Inability: Not on file  . Transportation needs:    Medical: Not on file    Non-medical: Not on file  Tobacco Use  . Smoking status: Never Smoker  . Smokeless tobacco: Never Used  Substance and Sexual Activity  . Alcohol use: No  . Drug use: No  . Sexual activity: Not on file  Lifestyle  . Physical activity:    Days per week: Not on file    Minutes per session: Not on file  . Stress: Not on file  Relationships  . Social connections:    Talks on phone: Not on file    Gets  together: Not on file    Attends religious service: Not on file    Active member of club or organization: Not on file    Attends meetings of clubs or organizations: Not on file    Relationship status: Not on file  . Intimate partner violence:    Fear of current or ex partner: Not on file    Emotionally abused: Not on file    Physically abused: Not on file    Forced sexual activity: Not on file  Other Topics Concern  . Not on file  Social History Narrative  . Not on file    Review of Systems: See HPI, otherwise negative ROS  Physical Exam: BP (!) 158/79   Pulse 71   Temp 97.8 F (36.6 C) (Tympanic)   Resp 18   Ht 5\' 3"  (1.6 m)   Wt 65.3 kg   SpO2 100%   BMI 25.51 kg/m  General:   Alert,  pleasant and cooperative in NAD Head:  Normocephalic and atraumatic. Neck:  Supple; no masses or thyromegaly. Lungs:  Clear throughout to auscultation, normal respiratory effort.    Heart:  +S1, +S2, Regular rate and rhythm, No edema. Abdomen:  Soft, nontender and nondistended. Normal bowel sounds, without guarding, and without rebound.   Neurologic:  Alert and  oriented x4;  grossly normal neurologically.  Impression/Plan: Heather Small is here for an endoscopy  to be performed for  evaluation of dysphagia    Risks, benefits, limitations, and alternatives regarding endoscopy and dilation  have been reviewed with the patient.  Questions have been answered.  All parties agreeable.   Jonathon Bellows, MD  07/30/2018, 9:04 AM

## 2018-07-30 NOTE — Transfer of Care (Signed)
Immediate Anesthesia Transfer of Care Note  Patient: Heather Small  Procedure(s) Performed: ESOPHAGOGASTRODUODENOSCOPY (EGD) WITH PROPOFOL (N/A )  Patient Location: PACU and Endoscopy Unit  Anesthesia Type:General  Level of Consciousness: sedated  Airway & Oxygen Therapy: Patient Spontanous Breathing  Post-op Assessment: Report given to RN  Post vital signs: stable  Last Vitals:  Vitals Value Taken Time  BP 194/70 07/30/2018  9:27 AM  Temp    Pulse 86 07/30/2018  9:32 AM  Resp 24 07/30/2018  9:32 AM  SpO2 97 % 07/30/2018  9:32 AM  Vitals shown include unvalidated device data.  Last Pain:  Vitals:   07/30/18 0837  TempSrc: Tympanic  PainSc: 0-No pain         Complications: No apparent anesthesia complications

## 2018-08-03 ENCOUNTER — Other Ambulatory Visit: Payer: Self-pay

## 2018-08-03 ENCOUNTER — Ambulatory Visit (INDEPENDENT_AMBULATORY_CARE_PROVIDER_SITE_OTHER): Payer: Medicare Other | Admitting: Gastroenterology

## 2018-08-03 VITALS — BP 181/83 | HR 87 | Ht 63.0 in | Wt 146.2 lb

## 2018-08-03 DIAGNOSIS — K222 Esophageal obstruction: Secondary | ICD-10-CM | POA: Diagnosis not present

## 2018-08-03 NOTE — Progress Notes (Signed)
Jonathon Bellows MD, MRCP(U.K) [place]  [place], [place] [address]  Main: [phone]  Fax: [phone]   Primary Care Physician: Samara Deist, MD  Primary Gastroenterologist:  Dr. Jonathon Bellows   No chief complaint on file.   HPI: Heather Small is a 82 y.o. female   Summary of history :  She is here today to see me as a follow up for dysphagia.   She presented to the ER on 05/11/2018 with abdominal pain followed by vomiting after she eats something hard.  There was concern for some issues with dysphagia and hence was referred to see me in GI.  Interval history   06/19/2018-  08/03/2018  07/13/18: EGD : tight stricture of the esophagus- dilated to 10 mm , large hiatal hernia 07/30/18 : EGD: restonsis of the stricture dilated to 11 mm    Feels much better- able to eat better    Current Outpatient Medications  Medication Sig Dispense Refill  . AZOPT 1 % ophthalmic suspension Place 1 drop into both eyes 2 (two) times daily.    . benzonatate (TESSALON) 200 MG capsule Take 1 capsule (200 mg total) by mouth every 8 (eight) hours. (Patient not taking: Reported on 06/19/2018) 21 capsule 0  . brimonidine (ALPHAGAN) 0.15 % ophthalmic solution Place 1 drop into both eyes 2 (two) times daily.    . cephALEXin (KEFLEX) 500 MG capsule Take 1 capsule (500 mg total) by mouth 2 (two) times daily. (Patient not taking: Reported on 06/19/2018) 14 capsule 0  . levothyroxine (SYNTHROID, LEVOTHROID) 88 MCG tablet Take 1 tablet by mouth daily.    Marland Kitchen losartan-hydrochlorothiazide (HYZAAR) 100-25 MG tablet losartan 100 mg-hydrochlorothiazide 25 mg tablet    . LUMIGAN 0.01 % SOLN Place 1 drop into both eyes at bedtime.    . metFORMIN (GLUCOPHAGE) 500 MG tablet Take 1 tablet by mouth 2 (two) times daily.    . Multiple Vitamin (MULTIVITAMIN) tablet Take 1 tablet by mouth daily.    Marland Kitchen omeprazole (PRILOSEC) 40 MG capsule Take 1 capsule (40 mg total) by mouth 2 (two) times daily. 180 capsule 0   . simvastatin (ZOCOR) 20 MG tablet Take 1 tablet by mouth at bedtime.    . timolol (TIMOPTIC) 0.5 % ophthalmic solution Place 1 drop into both eyes 2 (two) times daily.    . traMADol (ULTRAM) 50 MG tablet Take 1 tablet (50 mg total) by mouth every 12 (twelve) hours as needed. 12 tablet 0  . valsartan-hydrochlorothiazide (DIOVAN-HCT) 320-25 MG tablet Take 1 tablet by mouth daily.     No current facility-administered medications for this visit.     Allergies as of 08/03/2018 - Review Complete 07/30/2018  Allergen Reaction Noted  . Codeine Anaphylaxis and Other (See Comments) 07/23/2010    ROS:  General: Negative for anorexia, weight loss, fever, chills, fatigue, weakness. ENT: Negative for hoarseness, difficulty swallowing , nasal congestion. CV: Negative for chest pain, angina, palpitations, dyspnea on exertion, peripheral edema.  Respiratory: Negative for dyspnea at rest, dyspnea on exertion, cough, sputum, wheezing.  GI: See history of present illness. GU:  Negative for dysuria, hematuria, urinary incontinence, urinary frequency, nocturnal urination.  Endo: Negative for unusual weight change.    Physical Examination:   There were no vitals taken for this visit.  General: Well-nourished, well-developed in no acute distress.  Eyes: No icterus. Conjunctivae pink. Mouth: Oropharyngeal mucosa moist and pink , no lesions erythema or exudate. Lungs: Clear to auscultation bilaterally. Non-labored. Heart: Regular rate  and rhythm, no murmurs rubs or gallops.  Abdomen: Bowel sounds are normal, nontender, nondistended, no hepatosplenomegaly or masses, no abdominal bruits or hernia , no rebound or guarding.   Extremities: No lower extremity edema. No clubbing or deformities. Neuro: Alert and oriented x 3.  Grossly intact. Skin: Warm and dry, no jaundice.   Psych: Alert and cooperative, normal mood and affect.   Imaging Studies: No results found.  Assessment and Plan:   Heather Small  is a 82 y.o. y/o female here to follow up for dysphagia. S/p Dilation x 2 upto 11 mm   Plan  1. GERD: patient information provided 2. Prilosec 40 mg BID 3. Repeat EGD + dilation 08/13/17   I have discussed alternative options, risks & benefits,  which include, but are not limited to, bleeding, infection, perforation,respiratory complication & drug reaction.  The patient agrees with this plan & written consent will be obtained.     Dr Jonathon Bellows  MD,MRCP [facility]) Follow up in 6 weeks

## 2018-08-17 ENCOUNTER — Encounter: Payer: Self-pay | Admitting: *Deleted

## 2018-08-18 ENCOUNTER — Encounter: Payer: Self-pay | Admitting: *Deleted

## 2018-08-18 ENCOUNTER — Encounter
Admission: RE | Disposition: A | Discharge status: Home / Self Care | Payer: Self-pay | Source: Ambulatory Visit | Attending: Gastroenterology

## 2018-08-18 ENCOUNTER — Ambulatory Visit: Payer: 59 | Admitting: Certified Registered"

## 2018-08-18 ENCOUNTER — Ambulatory Visit
Admission: RE | Admit: 2018-08-18 | Discharge: 2018-08-18 | Disposition: A | Discharge status: Home / Self Care | Payer: 59 | Source: Ambulatory Visit | Attending: Gastroenterology | Admitting: Gastroenterology

## 2018-08-18 DIAGNOSIS — I10 Essential (primary) hypertension: Secondary | ICD-10-CM | POA: Insufficient documentation

## 2018-08-18 DIAGNOSIS — K222 Esophageal obstruction: Secondary | ICD-10-CM | POA: Insufficient documentation

## 2018-08-18 DIAGNOSIS — E119 Type 2 diabetes mellitus without complications: Secondary | ICD-10-CM | POA: Insufficient documentation

## 2018-08-18 DIAGNOSIS — Z79899 Other long term (current) drug therapy: Secondary | ICD-10-CM | POA: Insufficient documentation

## 2018-08-18 DIAGNOSIS — Z7984 Long term (current) use of oral hypoglycemic drugs: Secondary | ICD-10-CM | POA: Diagnosis not present

## 2018-08-18 DIAGNOSIS — K449 Diaphragmatic hernia without obstruction or gangrene: Secondary | ICD-10-CM | POA: Diagnosis not present

## 2018-08-18 DIAGNOSIS — E039 Hypothyroidism, unspecified: Secondary | ICD-10-CM | POA: Insufficient documentation

## 2018-08-18 HISTORY — PX: ESOPHAGOGASTRODUODENOSCOPY (EGD) WITH PROPOFOL: SHX5813

## 2018-08-18 LAB — GLUCOSE, CAPILLARY: Glucose-Capillary: 115 mg/dL — ABNORMAL HIGH (ref 70–99)

## 2018-08-18 SURGERY — ESOPHAGOGASTRODUODENOSCOPY (EGD) WITH PROPOFOL
Anesthesia: General

## 2018-08-18 MED ORDER — LIDOCAINE HCL (PF) 2 % IJ SOLN
INTRAMUSCULAR | Status: AC
  Filled 2018-08-18: qty 10

## 2018-08-18 MED ORDER — PROPOFOL 10 MG/ML IV BOLUS
INTRAVENOUS | Status: AC
  Filled 2018-08-18: qty 40

## 2018-08-18 MED ORDER — LIDOCAINE HCL (PF) 1 % IJ SOLN
2.0000 mL | Freq: Once | INTRAMUSCULAR | Status: AC
  Administered 2018-08-18: 0.3 mL via INTRADERMAL

## 2018-08-18 MED ORDER — SODIUM CHLORIDE 0.9 % IV SOLN
INTRAVENOUS | Status: DC
  Administered 2018-08-18: 1000 mL via INTRAVENOUS

## 2018-08-18 MED ORDER — PROPOFOL 500 MG/50ML IV EMUL
INTRAVENOUS | Status: DC | PRN
  Administered 2018-08-18: 100 ug/kg/min via INTRAVENOUS

## 2018-08-18 MED ORDER — LIDOCAINE HCL (CARDIAC) PF 100 MG/5ML IV SOSY
PREFILLED_SYRINGE | INTRAVENOUS | Status: DC | PRN
  Administered 2018-08-18: 40 mg via INTRATRACHEAL

## 2018-08-18 MED ORDER — LIDOCAINE HCL (PF) 1 % IJ SOLN
INTRAMUSCULAR | Status: AC
  Administered 2018-08-18: 0.3 mL via INTRADERMAL
  Filled 2018-08-18: qty 2

## 2018-08-18 MED ORDER — PROPOFOL 10 MG/ML IV BOLUS
INTRAVENOUS | Status: DC | PRN
  Administered 2018-08-18: 30 mg via INTRAVENOUS
  Administered 2018-08-18: 50 mg via INTRAVENOUS

## 2018-08-18 NOTE — Transfer of Care (Signed)
Immediate Anesthesia Transfer of Care Note  Patient: Heather Small  Procedure(s) Performed: EGD with Dilation (N/A )  Patient Location: Endoscopy Unit  Anesthesia Type:General  Level of Consciousness: drowsy  Airway & Oxygen Therapy: Patient Spontanous Breathing and Patient connected to nasal cannula oxygen  Post-op Assessment: Report given to RN and Post -op Vital signs reviewed and stable  Post vital signs: stable  Last Vitals:  Vitals Value Taken Time  BP 111/53 08/18/2018  9:37 AM  Temp 36.4 C 08/18/2018  9:36 AM  Pulse 92 08/18/2018  9:39 AM  Resp 22 08/18/2018  9:39 AM  SpO2 98 % 08/18/2018  9:39 AM  Vitals shown include unvalidated device data.  Last Pain:  Vitals:   08/18/18 0936  TempSrc: Tympanic  PainSc:          Complications: No apparent anesthesia complications

## 2018-08-18 NOTE — Op Note (Signed)
[facility] Gastroenterology Patient Name: Heather Small Procedure Date: 08/18/2018 9:10 AM MRN: [id number] Account #: [id number] Date of Birth: Dec 14, 1935 Admit Type: Outpatient Age: 83 Room: [facility] ENDO ROOM 1 Gender: Female Note Status: Finalized Procedure:            Upper GI endoscopy Indications:          Follow-up of esophageal stenosis Providers:            Jonathon Bellows MD, MD Referring MD:         No Local Md, MD (Referring MD) Medicines:            Monitored Anesthesia Care Complications:        No immediate complications. Procedure:            Pre-Anesthesia Assessment:                       - Prior to the procedure, a History and Physical was                        performed, and patient medications, allergies and                        sensitivities were reviewed. The patient's tolerance of                        previous anesthesia was reviewed.                       - The risks and benefits of the procedure and the                        sedation options and risks were discussed with the                        patient. All questions were answered and informed                        consent was obtained.                       - After reviewing the risks and benefits, the patient                        was deemed in satisfactory condition to undergo the                        procedure.                       - ASA Grade Assessment: II - A patient with mild                        systemic disease.                       After obtaining informed consent, the endoscope was                        passed under direct vision. Throughout the procedure,  the patient's blood pressure, pulse, and oxygen                        saturations were monitored continuously. The Endoscope                        was introduced through the mouth, and advanced to the                        third part of duodenum. The upper GI endoscopy was         accomplished with ease. The patient tolerated the                        procedure well. Findings:      The examined duodenum was normal.      The stomach was normal.      A medium-sized hiatal hernia was present.      One benign-appearing, intrinsic moderate (circumferential scarring or       stenosis; an endoscope may pass) stenosis was found at the       gastroesophageal junction. This stenosis measured 1 cm (inner diameter)       x less than one cm (in length). The stenosis was traversed. A TTS       dilator was passed through the scope. Dilation with a 05-23-11 mm       balloon dilator was performed to 12 mm. The dilation site was examined       following endoscope reinsertion and showed moderate mucosal disruption. Impression:           - Normal examined duodenum.                       - Normal stomach.                       - Medium-sized hiatal hernia.                       - Benign-appearing esophageal stenosis. Dilated.                       - No specimens collected. Recommendation:       - Discharge patient to home (with escort).                       - Resume previous diet.                       - Continue present medications.                       - Repeat upper endoscopy in 2 weeks to evaluate the                        response to therapy. Procedure Code(s):    --- Professional ---                       [id number], Esophagogastroduodenoscopy, flexible, transoral;                        with transendoscopic balloon dilation of esophagus                        (  less than 30 mm diameter) Diagnosis Code(s):    --- Professional ---                       K44.9, Diaphragmatic hernia without obstruction or                        gangrene                       K22.2, Esophageal obstruction CPT copyright 2018 American Medical Association. All rights reserved. The codes documented in this report are preliminary and upon coder review may  be revised to meet current compliance  requirements. Jonathon Bellows, MD Jonathon Bellows MD, MD 08/18/2018 9:28:27 AM This report has been signed electronically. Number of Addenda: 0 Note Initiated On: 08/18/2018 9:10 AM      [facility]

## 2018-08-18 NOTE — H&P (Signed)
Jonathon Bellows, MD [address], [place], [place], [place], [address] [address], [place], [place], [place], [address] Phone: [phone]  Fax: [phone]  Primary Care Physician:  Samara Deist, MD   Pre-Procedure History & Physical: HPI:  Heather Small is a 83 y.o. female is here for an endoscopy    Past Medical History:  Diagnosis Date  . Diabetes mellitus without complication ([place])   . Hyperlipemia   . Hypertension   . Hypothyroidism   . Thyroid disease     Past Surgical History:  Procedure Laterality Date  . COLONOSCOPY    . ESOPHAGEAL DILATION    . ESOPHAGOGASTRODUODENOSCOPY (EGD) WITH PROPOFOL N/A 07/13/2018   Procedure: ESOPHAGOGASTRODUODENOSCOPY (EGD) WITH PROPOFOL;  Surgeon: Jonathon Bellows, MD;  Location[facility] ENDOSCOPY;  Service: Gastroenterology;  Laterality: N/A;  . ESOPHAGOGASTRODUODENOSCOPY (EGD) WITH PROPOFOL N/A 07/30/2018   Procedure: ESOPHAGOGASTRODUODENOSCOPY (EGD) WITH PROPOFOL;  Surgeon: Jonathon Bellows, MD;  Location: [facility] ENDOSCOPY;  Service: Gastroenterology;  Laterality: N/A;  . MASTECTOMY Left     Prior to Admission medications   Medication Sig Start Date End Date Taking? Authorizing Provider  AZOPT 1 % ophthalmic suspension Place 1 drop into both eyes 2 (two) times daily. 04/11/15   [provider]  benzonatate (TESSALON) 200 MG capsule Take 1 capsule (200 mg total) by mouth every 8 (eight) hours. Patient not taking: Reported on 08/03/2018 03/19/16   Crecencio Mc P, PA-C  brimonidine [facility]) 0.15 % ophthalmic solution Place 1 drop into both eyes 2 (two) times daily. 04/11/15   [provider]  cephALEXin (KEFLEX) 500 MG capsule Take 1 capsule (500 mg total) by mouth 2 (two) times daily. 02/19/17   Fritzi Mandes, MD  levothyroxine (SYNTHROID, LEVOTHROID) 88 MCG tablet Take 1 tablet by mouth daily. 06/05/15   [provider]  losartan-hydrochlorothiazide (HYZAAR) 100-25 MG tablet losartan 100 mg-hydrochlorothiazide 25  mg tablet    [provider]  LUMIGAN 0.01 % SOLN Place 1 drop into both eyes at bedtime. 04/11/15   [provider]  metFORMIN (GLUCOPHAGE) 500 MG tablet Take 1 tablet by mouth 2 (two) times daily. 06/15/15   [provider]  Multiple Vitamin (MULTIVITAMIN) tablet Take 1 tablet by mouth daily.    [provider]  omeprazole (PRILOSEC) 40 MG capsule Take 1 capsule (40 mg total) by mouth 2 (two) times daily. 07/13/18 10/11/18  Jonathon Bellows, MD  simvastatin (ZOCOR) 20 MG tablet Take 1 tablet by mouth at bedtime. 06/15/15   [provider]  timolol (TIMOPTIC) 0.5 % ophthalmic solution Place 1 drop into both eyes 2 (two) times daily. 04/11/15   [provider]  traMADol (ULTRAM) 50 MG tablet Take 1 tablet (50 mg total) by mouth every 12 (twelve) hours as needed. Patient not taking: Reported on 08/03/2018 11/30/16   Sable Feil, PA-C  valsartan-hydrochlorothiazide (DIOVAN-HCT) 320-25 MG tablet Take 1 tablet by mouth daily. 04/13/15   [provider]    Allergies as of 08/03/2018 - Review Complete 08/03/2018  Allergen Reaction Noted  . Codeine Anaphylaxis and Other (See Comments) 07/23/2010    Family History  Problem Relation Age of Onset  . Diabetes Other   . Heart Problems Mother   . Cancer Father     Social History   Socioeconomic History  . Marital status: Widowed    Spouse name: Not on file  . Number of children: Not on file  . Years of education: Not on file  . Highest education level:  Not on file  Occupational History  . Not on file  Social Needs  . Financial resource strain: Not on file  . Food insecurity:    Worry: Not on file    Inability: Not on file  . Transportation needs:    Medical: Not on file    Non-medical: Not on file  Tobacco Use  . Smoking status: Never Smoker  . Smokeless tobacco: Never Used  Substance and Sexual Activity  . Alcohol use: No  . Drug use: No  . Sexual activity: Not on file    Lifestyle  . Physical activity:    Days per week: Not on file    Minutes per session: Not on file  . Stress: Not on file  Relationships  . Social connections:    Talks on phone: Not on file    Gets together: Not on file    Attends religious service: Not on file    Active member of club or organization: Not on file    Attends meetings of clubs or organizations: Not on file    Relationship status: Not on file  . Intimate partner violence:    Fear of current or ex partner: Not on file    Emotionally abused: Not on file    Physically abused: Not on file    Forced sexual activity: Not on file  Other Topics Concern  . Not on file  Social History Narrative  . Not on file    Review of Systems: See HPI, otherwise negative ROS  Physical Exam: BP (!) 174/81   Pulse 79   Temp (!) 96.8 F (36 C) (Tympanic)   Resp 17   Ht 5\' 3"  (1.6 m)   Wt 67.1 kg   SpO2 99%   BMI 26.22 kg/m  General:   Alert,  pleasant and cooperative in NAD Head:  Normocephalic and atraumatic. Neck:  Supple; no masses or thyromegaly. Lungs:  Clear throughout to auscultation, normal respiratory effort.    Heart:  +S1, +S2, Regular rate and rhythm, No edema. Abdomen:  Soft, nontender and nondistended. Normal bowel sounds, without guarding, and without rebound.   Neurologic:  Alert and  oriented x4;  grossly normal neurologically.  Impression/Plan: Heather Small is here for an endoscopy  to be performed for  evaluation of dysphagia    Risks, benefits, limitations, and alternatives regarding endoscopy and dilation  have been reviewed with the patient.  Questions have been answered.  All parties agreeable.   Jonathon Bellows, MD  08/18/2018, 9:11 AM

## 2018-08-18 NOTE — Anesthesia Preprocedure Evaluation (Addendum)
Anesthesia Evaluation  Patient identified by MRN, date of birth, ID band Patient awake    Reviewed: Allergy & Precautions, H&P , NPO status , Patient's Chart, lab work & pertinent test results  Airway Mallampati: III       Dental  (+) Edentulous Upper, Poor Dentition   Pulmonary neg pulmonary ROS,           Cardiovascular hypertension,      Neuro/Psych CVA negative psych ROS   GI/Hepatic negative GI ROS, Neg liver ROS,   Endo/Other  diabetesHypothyroidism   Renal/GU negative Renal ROS  negative genitourinary   Musculoskeletal   Abdominal   Peds  Hematology negative hematology ROS (+)   Anesthesia Other Findings Past Medical History: No date: Diabetes mellitus without complication ([facility]) No date: Hyperlipemia No date: Hypertension No date: Hypothyroidism No date: Thyroid disease  Past Surgical History: No date: COLONOSCOPY No date: ESOPHAGEAL DILATION 07/13/2018: ESOPHAGOGASTRODUODENOSCOPY (EGD) WITH PROPOFOL; N/A     Comment:  Procedure: ESOPHAGOGASTRODUODENOSCOPY (EGD) WITH               PROPOFOL;  Surgeon: Jonathon Bellows, MD;  Location: [facility]               ENDOSCOPY;  Service: Gastroenterology;  Laterality: N/A; 07/30/2018: ESOPHAGOGASTRODUODENOSCOPY (EGD) WITH PROPOFOL; N/A     Comment:  Procedure: ESOPHAGOGASTRODUODENOSCOPY (EGD) WITH               PROPOFOL;  Surgeon: Jonathon Bellows, MD;  Location: [facility]               ENDOSCOPY;  Service: Gastroenterology;  Laterality: N/A; No date: MASTECTOMY; Left  BMI    Body Mass Index:  26.22 kg/m      Reproductive/Obstetrics negative OB ROS                            Anesthesia Physical Anesthesia Plan  ASA: III  Anesthesia Plan: General   Post-op Pain Management:    Induction:   PONV Risk Score and Plan: Propofol infusion and TIVA  Airway Management Planned:   Additional Equipment:   Intra-op Plan:   Post-operative Plan:    Informed Consent: I have reviewed the patients History and Physical, chart, labs and discussed the procedure including the risks, benefits and alternatives for the proposed anesthesia with the patient or authorized representative who has indicated his/her understanding and acceptance.   Dental Advisory Given  Plan Discussed with: Anesthesiologist, CRNA and Surgeon  Anesthesia Plan Comments:         Anesthesia Quick Evaluation

## 2018-08-18 NOTE — Anesthesia Post-op Follow-up Note (Signed)
Anesthesia QCDR form completed.        

## 2018-08-19 ENCOUNTER — Encounter: Payer: Self-pay | Admitting: Gastroenterology

## 2018-08-19 NOTE — Anesthesia Postprocedure Evaluation (Signed)
Anesthesia Post Note  Patient: Heather Small  Procedure(s) Performed: EGD with Dilation (N/A )  Patient location during evaluation: PACU Anesthesia Type: General Level of consciousness: awake and alert Pain management: pain level controlled Vital Signs Assessment: post-procedure vital signs reviewed and stable Respiratory status: spontaneous breathing, nonlabored ventilation and respiratory function stable Cardiovascular status: blood pressure returned to baseline and stable Postop Assessment: no apparent nausea or vomiting Anesthetic complications: no     Last Vitals:  Vitals:   08/18/18 0936 08/18/18 0937  BP:  (!) 111/53  Pulse:  87  Resp:  20  Temp: (!) 36.4 C   SpO2:  96%    Last Pain:  Vitals:   08/19/18 0745  TempSrc:   PainSc: 0-No pain                 Durenda Hurt

## 2018-09-14 ENCOUNTER — Other Ambulatory Visit: Payer: Self-pay | Admitting: Gastroenterology

## 2018-09-15 ENCOUNTER — Other Ambulatory Visit: Payer: Self-pay

## 2018-09-15 DIAGNOSIS — K222 Esophageal obstruction: Secondary | ICD-10-CM

## 2018-09-16 ENCOUNTER — Ambulatory Visit: Payer: Medicare Other | Admitting: Gastroenterology

## 2018-09-29 ENCOUNTER — Ambulatory Visit: Payer: Medicare PPO | Admitting: Anesthesiology

## 2018-09-29 ENCOUNTER — Encounter
Admission: RE | Disposition: A | Discharge status: Home / Self Care | Payer: Self-pay | Source: Home / Self Care | Attending: Gastroenterology

## 2018-09-29 ENCOUNTER — Ambulatory Visit
Admission: RE | Admit: 2018-09-29 | Discharge: 2018-09-29 | Disposition: A | Discharge status: Home / Self Care | Payer: Medicare PPO | Attending: Gastroenterology | Admitting: Gastroenterology

## 2018-09-29 DIAGNOSIS — R1310 Dysphagia, unspecified: Secondary | ICD-10-CM | POA: Insufficient documentation

## 2018-09-29 DIAGNOSIS — Z79899 Other long term (current) drug therapy: Secondary | ICD-10-CM | POA: Insufficient documentation

## 2018-09-29 DIAGNOSIS — I10 Essential (primary) hypertension: Secondary | ICD-10-CM | POA: Diagnosis not present

## 2018-09-29 DIAGNOSIS — Z885 Allergy status to narcotic agent status: Secondary | ICD-10-CM | POA: Diagnosis not present

## 2018-09-29 DIAGNOSIS — Z8673 Personal history of transient ischemic attack (TIA), and cerebral infarction without residual deficits: Secondary | ICD-10-CM | POA: Insufficient documentation

## 2018-09-29 DIAGNOSIS — E119 Type 2 diabetes mellitus without complications: Secondary | ICD-10-CM | POA: Insufficient documentation

## 2018-09-29 DIAGNOSIS — K222 Esophageal obstruction: Secondary | ICD-10-CM | POA: Insufficient documentation

## 2018-09-29 DIAGNOSIS — Z7984 Long term (current) use of oral hypoglycemic drugs: Secondary | ICD-10-CM | POA: Diagnosis not present

## 2018-09-29 DIAGNOSIS — E039 Hypothyroidism, unspecified: Secondary | ICD-10-CM | POA: Diagnosis not present

## 2018-09-29 DIAGNOSIS — K449 Diaphragmatic hernia without obstruction or gangrene: Secondary | ICD-10-CM | POA: Diagnosis not present

## 2018-09-29 DIAGNOSIS — E785 Hyperlipidemia, unspecified: Secondary | ICD-10-CM | POA: Diagnosis not present

## 2018-09-29 DIAGNOSIS — Z79890 Hormone replacement therapy (postmenopausal): Secondary | ICD-10-CM | POA: Diagnosis not present

## 2018-09-29 HISTORY — PX: ESOPHAGOGASTRODUODENOSCOPY (EGD) WITH PROPOFOL: SHX5813

## 2018-09-29 LAB — GLUCOSE, CAPILLARY: Glucose-Capillary: 105 mg/dL — ABNORMAL HIGH (ref 70–99)

## 2018-09-29 SURGERY — ESOPHAGOGASTRODUODENOSCOPY (EGD) WITH PROPOFOL
Anesthesia: General

## 2018-09-29 MED ORDER — PROPOFOL 10 MG/ML IV BOLUS
INTRAVENOUS | Status: AC
  Filled 2018-09-29: qty 20

## 2018-09-29 MED ORDER — PROPOFOL 500 MG/50ML IV EMUL
INTRAVENOUS | Status: DC | PRN
  Administered 2018-09-29: 120 ug/kg/min via INTRAVENOUS

## 2018-09-29 MED ORDER — LIDOCAINE HCL (CARDIAC) PF 100 MG/5ML IV SOSY
PREFILLED_SYRINGE | INTRAVENOUS | Status: DC | PRN
  Administered 2018-09-29: 70 mg via INTRAVENOUS

## 2018-09-29 MED ORDER — PROPOFOL 10 MG/ML IV BOLUS
INTRAVENOUS | Status: DC | PRN
  Administered 2018-09-29: 50 mg via INTRAVENOUS

## 2018-09-29 MED ORDER — PROPOFOL 500 MG/50ML IV EMUL
INTRAVENOUS | Status: AC
  Filled 2018-09-29: qty 50

## 2018-09-29 MED ORDER — SODIUM CHLORIDE 0.9 % IV SOLN
INTRAVENOUS | Status: DC
  Administered 2018-09-29: 10:00:00 via INTRAVENOUS

## 2018-09-29 NOTE — Op Note (Signed)
[facility] Gastroenterology Patient Name: Heather Small Procedure Date: 09/29/2018 9:47 AM MRN: [id number] Account #: [id number] Date of Birth: [birth year] Admit Type: Outpatient Age: 83 Room: [facility] ENDO ROOM 1 Gender: Female Note Status: Finalized Procedure:            Upper GI endoscopy Indications:          Dysphagia Providers:            Jonathon Bellows MD, MD Medicines:            Monitored Anesthesia Care Complications:        No immediate complications. Procedure:            Pre-Anesthesia Assessment:                       - Prior to the procedure, a History and Physical was                        performed, and patient medications, allergies and                        sensitivities were reviewed. The patient's tolerance of                        previous anesthesia was reviewed.                       - The risks and benefits of the procedure and the                        sedation options and risks were discussed with the                        patient. All questions were answered and informed                        consent was obtained.                       - ASA Grade Assessment: III - A patient with severe                        systemic disease.                       After obtaining informed consent, the endoscope was                        passed under direct vision. Throughout the procedure,                        the patient's blood pressure, pulse, and oxygen                        saturations were monitored continuously. The Endoscope                        was introduced through the mouth, and advanced to the                        third part of duodenum. The upper GI endoscopy was  accomplished with ease. The patient tolerated the                        procedure well. Findings:      The examined duodenum was normal.      The stomach was normal.      A large hiatal hernia was present.      One benign-appearing, intrinsic mild  stenosis was found at the       gastroesophageal junction. This stenosis measured 1.2 cm (inner       diameter) x less than one cm (in length). The stenosis was traversed. A       TTS dilator was passed through the scope. Dilation with a 12-13.5-15 mm       balloon dilator was performed to 15 mm. The dilation site was examined       and showed complete resolution of luminal narrowing. Biopsies were       obtained from the proximal and distal esophagus with cold forceps for       histology of suspected eosinophilic esophagitis. Impression:           - Normal examined duodenum.                       - Normal stomach.                       - Large hiatal hernia.                       - Benign-appearing esophageal stenosis. Dilated.                        Biopsied. Recommendation:       - Discharge patient to home (with escort).                       - Resume previous diet.                       - Continue present medications.                       - Return to my office PRN. Procedure Code(s):    --- Professional ---                       [phone], Esophagogastroduodenoscopy, flexible, transoral;                        with transendoscopic balloon dilation of esophagus                        (less than 30 mm diameter)                       43239, 59, Esophagogastroduodenoscopy, flexible,                        transoral; with biopsy, single or multiple Diagnosis Code(s):    --- Professional ---                       K44.9, Diaphragmatic hernia without obstruction or  gangrene                       K22.2, Esophageal obstruction                       R13.10, Dysphagia, unspecified CPT copyright 2018 American Medical Association. All rights reserved. The codes documented in this report are preliminary and upon coder review may  be revised to meet current compliance requirements. Jonathon Bellows, MD Jonathon Bellows MD, MD 09/29/2018 10:08:56 AM This report has been signed  electronically. Number of Addenda: 0 Note Initiated On: 09/29/2018 9:47 AM      [facility]

## 2018-09-29 NOTE — Transfer of Care (Signed)
Immediate Anesthesia Transfer of Care Note  Patient: Heather Small  Procedure(s) Performed: ESOPHAGOGASTRODUODENOSCOPY (EGD) WITH PROPOFOL (N/A )  Patient Location: PACU  Anesthesia Type:General  Level of Consciousness: sedated  Airway & Oxygen Therapy: Patient Spontanous Breathing and Patient connected to nasal cannula oxygen  Post-op Assessment: Report given to RN and Post -op Vital signs reviewed and stable  Post vital signs: Reviewed and stable  Last Vitals:  Vitals Value Taken Time  BP    Temp    Pulse    Resp    SpO2      Last Pain:  Vitals:   09/29/18 0910  TempSrc: Oral  PainSc: 0-No pain         Complications: No apparent anesthesia complications

## 2018-09-29 NOTE — H&P (Signed)
Jonathon Bellows, MD [address] [place], [place], [place], [address] [address], [place], [place], [place], [address] Phone: [phone]  Fax: [phone]  Primary Care Physician:  Samara Deist, MD   Pre-Procedure History & Physical: HPI:  Heather Small is a 83 y.o. female is here for an endoscopy    Past Medical History:  Diagnosis Date  . Diabetes mellitus without complication ([place])   . Hyperlipemia   . Hypertension   . Hypothyroidism   . Thyroid disease     Past Surgical History:  Procedure Laterality Date  . COLONOSCOPY    . ESOPHAGEAL DILATION    . ESOPHAGOGASTRODUODENOSCOPY (EGD) WITH PROPOFOL N/A 07/13/2018   Procedure: ESOPHAGOGASTRODUODENOSCOPY (EGD) WITH PROPOFOL;  Surgeon: Jonathon Bellows, MD;  Location: [facility] ENDOSCOPY;  Service: Gastroenterology;  Laterality: N/A;  . ESOPHAGOGASTRODUODENOSCOPY (EGD) WITH PROPOFOL N/A 07/30/2018   Procedure: ESOPHAGOGASTRODUODENOSCOPY (EGD) WITH PROPOFOL;  Surgeon: Jonathon Bellows, MD;  Location: [facility] ENDOSCOPY;  Service: Gastroenterology;  Laterality: N/A;  . ESOPHAGOGASTRODUODENOSCOPY (EGD) WITH PROPOFOL N/A 08/18/2018   Procedure: EGD with Dilation;  Surgeon: Jonathon Bellows, MD;  Location: [facility] ENDOSCOPY;  Service: Gastroenterology;  Laterality: N/A;  . MASTECTOMY Left     Prior to Admission medications   Medication Sig Start Date End Date Taking? Authorizing Provider  AZOPT 1 % ophthalmic suspension Place 1 drop into both eyes 2 (two) times daily. 04/11/15  Yes [provider]  brimonidine (ALPHAGAN) 0.15 % ophthalmic solution Place 1 drop into both eyes 2 (two) times daily. 04/11/15  Yes [provider]  levothyroxine (SYNTHROID, LEVOTHROID) 88 MCG tablet Take 1 tablet by mouth daily. 06/05/15  Yes [provider]  losartan-hydrochlorothiazide (HYZAAR) 100-25 MG tablet losartan 100 mg-hydrochlorothiazide 25 mg tablet   Yes [provider]  LUMIGAN 0.01 % SOLN Place 1 drop into both eyes at  bedtime. 04/11/15  Yes [provider]  metFORMIN (GLUCOPHAGE) 500 MG tablet Take 1 tablet by mouth 2 (two) times daily. 06/15/15  Yes [provider]  Multiple Vitamin (MULTIVITAMIN) tablet Take 1 tablet by mouth daily.   Yes [provider]  omeprazole (PRILOSEC) 40 MG capsule Take 1 capsule (40 mg total) by mouth 2 (two) times daily. 07/13/18 10/11/18 Yes Jonathon Bellows, MD  simvastatin (ZOCOR) 20 MG tablet Take 1 tablet by mouth at bedtime. 06/15/15  Yes [provider]  timolol (TIMOPTIC) 0.5 % ophthalmic solution Place 1 drop into both eyes 2 (two) times daily. 04/11/15  Yes [provider]  valsartan-hydrochlorothiazide (DIOVAN-HCT) 320-25 MG tablet Take 1 tablet by mouth daily. 04/13/15  Yes [provider]  benzonatate (TESSALON) 200 MG capsule Take 1 capsule (200 mg total) by mouth every 8 (eight) hours. Patient not taking: Reported on 08/03/2018 03/19/16   Crecencio Mc P, PA-C  cephALEXin (KEFLEX) 500 MG capsule Take 1 capsule (500 mg total) by mouth 2 (two) times daily. Patient not taking: Reported on 09/29/2018 02/19/17   Fritzi Mandes, MD  traMADol (ULTRAM) 50 MG tablet Take 1 tablet (50 mg total) by mouth every 12 (twelve) hours as needed. Patient not taking: Reported on 08/03/2018 11/30/16   Sable Feil, PA-C    Allergies as of 09/16/2018 - Review Complete 08/18/2018  Allergen Reaction Noted  . Codeine Anaphylaxis and Other (See Comments) 07/23/2010    Family History  Problem Relation Age of Onset  . Diabetes Other   . Heart Problems Mother   . Cancer Father     Social History   Socioeconomic  History  . Marital status: Widowed    Spouse name: Not on file  . Number of children: Not on file  . Years of education: Not on file  . Highest education level: Not on file  Occupational History  . Not on file  Social Needs  . Financial resource strain: Not on file  . Food insecurity:    Worry: Not on file    Inability: Not on  file  . Transportation needs:    Medical: Not on file    Non-medical: Not on file  Tobacco Use  . Smoking status: Never Smoker  . Smokeless tobacco: Never Used  Substance and Sexual Activity  . Alcohol use: No  . Drug use: No  . Sexual activity: Not on file  Lifestyle  . Physical activity:    Days per week: Not on file    Minutes per session: Not on file  . Stress: Not on file  Relationships  . Social connections:    Talks on phone: Not on file    Gets together: Not on file    Attends religious service: Not on file    Active member of club or organization: Not on file    Attends meetings of clubs or organizations: Not on file    Relationship status: Not on file  . Intimate partner violence:    Fear of current or ex partner: Not on file    Emotionally abused: Not on file    Physically abused: Not on file    Forced sexual activity: Not on file  Other Topics Concern  . Not on file  Social History Narrative  . Not on file    Review of Systems: See HPI, otherwise negative ROS  Physical Exam: BP (!) 167/92   Pulse 77   Temp 98.2 F (36.8 C) (Oral)   Resp 15   Ht 5\' 3"  (1.6 m)   Wt 66.7 kg   SpO2 99%   BMI 26.04 kg/m  General:   Alert,  pleasant and cooperative in NAD Head:  Normocephalic and atraumatic. Neck:  Supple; no masses or thyromegaly. Lungs:  Clear throughout to auscultation, normal respiratory effort.    Heart:  +S1, +S2, Regular rate and rhythm, No edema. Abdomen:  Soft, nontender and nondistended. Normal bowel sounds, without guarding, and without rebound.   Neurologic:  Alert and  oriented x4;  grossly normal neurologically.  Impression/Plan: Heather Small is here for an endoscopy  to be performed for  evaluation of dysphagia    Risks, benefits, limitations, and alternatives regarding endoscopy and dilation have been reviewed with the patient.  Questions have been answered.  All parties agreeable.   Jonathon Bellows, MD  09/29/2018, 9:56  AM

## 2018-09-29 NOTE — Anesthesia Post-op Follow-up Note (Signed)
Anesthesia QCDR form completed.        

## 2018-09-29 NOTE — Anesthesia Preprocedure Evaluation (Addendum)
Anesthesia Evaluation  Patient identified by MRN, date of birth, ID band Patient awake    Reviewed: Allergy & Precautions, H&P , NPO status , Patient's Chart, lab work & pertinent test results  Airway Mallampati: III       Dental  (+) Edentulous Upper, Poor Dentition   Pulmonary neg pulmonary ROS,           Cardiovascular hypertension,      Neuro/Psych CVA negative psych ROS   GI/Hepatic negative GI ROS, Neg liver ROS,   Endo/Other  diabetesHypothyroidism   Renal/GU negative Renal ROS  negative genitourinary   Musculoskeletal   Abdominal   Peds  Hematology negative hematology ROS (+)   Anesthesia Other Findings Past Medical History: No date: Diabetes mellitus without complication (HCC) No date: Hyperlipemia No date: Hypertension No date: Hypothyroidism No date: Thyroid disease  Past Surgical History: No date: COLONOSCOPY No date: ESOPHAGEAL DILATION 07/13/2018: ESOPHAGOGASTRODUODENOSCOPY (EGD) WITH PROPOFOL; N/A     Comment:  Procedure: ESOPHAGOGASTRODUODENOSCOPY (EGD) WITH               PROPOFOL;  Surgeon: Jonathon Bellows, MD;  Location: [facility]               ENDOSCOPY;  Service: Gastroenterology;  Laterality: N/A; 07/30/2018: ESOPHAGOGASTRODUODENOSCOPY (EGD) WITH PROPOFOL; N/A     Comment:  Procedure: ESOPHAGOGASTRODUODENOSCOPY (EGD) WITH               PROPOFOL;  Surgeon: Jonathon Bellows, MD;  Location: [facility]               ENDOSCOPY;  Service: Gastroenterology;  Laterality: N/A; No date: MASTECTOMY; Left  BMI    Body Mass Index:  26.22 kg/m      Reproductive/Obstetrics negative OB ROS                             Anesthesia Physical  Anesthesia Plan  ASA: III  Anesthesia Plan: General   Post-op Pain Management:    Induction:   PONV Risk Score and Plan: Propofol infusion and TIVA  Airway Management Planned: Natural Airway and Nasal Cannula  Additional Equipment:   Intra-op  Plan:   Post-operative Plan:   Informed Consent: I have reviewed the patients History and Physical, chart, labs and discussed the procedure including the risks, benefits and alternatives for the proposed anesthesia with the patient or authorized representative who has indicated his/her understanding and acceptance.     Dental Advisory Given  Plan Discussed with: Anesthesiologist and CRNA  Anesthesia Plan Comments:         Anesthesia Quick Evaluation

## 2018-09-30 ENCOUNTER — Encounter: Payer: Self-pay | Admitting: Gastroenterology

## 2018-09-30 NOTE — Anesthesia Postprocedure Evaluation (Signed)
Anesthesia Post Note  Patient: Heather Small  Procedure(s) Performed: ESOPHAGOGASTRODUODENOSCOPY (EGD) WITH PROPOFOL (N/A )  Patient location during evaluation: PACU Anesthesia Type: General Level of consciousness: awake and alert Pain management: pain level controlled Vital Signs Assessment: post-procedure vital signs reviewed and stable Respiratory status: spontaneous breathing, nonlabored ventilation, respiratory function stable and patient connected to nasal cannula oxygen Cardiovascular status: blood pressure returned to baseline and stable Postop Assessment: no apparent nausea or vomiting Anesthetic complications: no     Last Vitals:  Vitals:   09/29/18 1031 09/29/18 1041  BP: 131/80 (!) 149/69  Pulse: 82 75  Resp: 19 19  Temp:    SpO2: 96% 98%    Last Pain:  Vitals:   09/30/18 0752  TempSrc:   PainSc: 0-No pain                 Durenda Hurt

## 2018-10-01 LAB — SURGICAL PATHOLOGY

## 2018-10-13 ENCOUNTER — Encounter

## 2018-10-13 ENCOUNTER — Ambulatory Visit: Payer: Medicare PPO | Admitting: Gastroenterology

## 2018-10-22 ENCOUNTER — Encounter: Payer: Self-pay | Admitting: Gastroenterology

## 2018-11-26 ENCOUNTER — Ambulatory Visit: Payer: Medicare PPO | Admitting: Gastroenterology

## 2019-01-14 ENCOUNTER — Ambulatory Visit (INDEPENDENT_AMBULATORY_CARE_PROVIDER_SITE_OTHER): Payer: Medicare PPO | Admitting: Gastroenterology

## 2019-01-14 DIAGNOSIS — K222 Esophageal obstruction: Secondary | ICD-10-CM

## 2019-01-14 DIAGNOSIS — K219 Gastro-esophageal reflux disease without esophagitis: Secondary | ICD-10-CM | POA: Diagnosis not present

## 2019-01-14 NOTE — Progress Notes (Signed)
Jonathon Bellows , MD [address]  [place]  [place], [place] [address]  Main: [phone]  Fax: [phone]   Primary Care Physician: Samara Deist, MD  Virtual Visit via Telephone Note  I connected with patient on 01/14/19 at 10:30 AM EDT by telephone and verified that I am speaking with the correct person using two identifiers.   I discussed the limitations, risks, security and privacy concerns of performing an evaluation and management service by telephone and the availability of in person appointments. I also discussed with the patient that there may be a patient responsible charge related to this service. The patient expressed understanding and agreed to proceed.  Location of Patient: Home Location of Provider: Home Persons involved: Patient and provider only   History of Present Illness: Chief Complaint  Patient presents with  . Follow-up    Stricture esophagus    HPI: Heather Small is a 83 y.o. female   Summary of history :  She is here today to see me as a follow up for dysphagia. She presented to the ER on 05/11/2018 with abdominal pain followed by vomiting after she eats something hard. There was concern for some issues with dysphagia and hence was referred to see me in GI. 07/13/18: EGD : tight stricture of the esophagus- dilated to 10 mm , large hiatal hernia 07/30/18 : EGD: restonsis of the stricture dilated to 11 mm    Interval history 08/03/2018-01/14/2019  08/18/2018: EGD_ dilated stricture to 12 mm 09/29/2018 : EGD: dilated to 15 mm .  She says she is doing well, no issues with swallowing , eating well. On Prilosec twice daily- unclear of the dosage    Current Outpatient Medications  Medication Sig Dispense Refill  . AZOPT 1 % ophthalmic suspension Place 1 drop into both eyes 2 (two) times daily.    . brimonidine (ALPHAGAN) 0.15 % ophthalmic solution Place 1 drop into both eyes 2 (two) times daily.    Marland Kitchen levothyroxine (SYNTHROID, LEVOTHROID)  88 MCG tablet Take 1 tablet by mouth daily.    Marland Kitchen losartan-hydrochlorothiazide (HYZAAR) 100-25 MG tablet losartan 100 mg-hydrochlorothiazide 25 mg tablet    . LUMIGAN 0.01 % SOLN Place 1 drop into both eyes at bedtime.    . metFORMIN (GLUCOPHAGE) 500 MG tablet Take 1 tablet by mouth 2 (two) times daily.    . Multiple Vitamin (MULTIVITAMIN) tablet Take 1 tablet by mouth daily.    . simvastatin (ZOCOR) 20 MG tablet Take 1 tablet by mouth at bedtime.    . timolol (TIMOPTIC) 0.5 % ophthalmic solution Place 1 drop into both eyes 2 (two) times daily.    . benzonatate (TESSALON) 200 MG capsule Take 1 capsule (200 mg total) by mouth every 8 (eight) hours. (Patient not taking: Reported on 08/03/2018) 21 capsule 0  . cephALEXin (KEFLEX) 500 MG capsule Take 1 capsule (500 mg total) by mouth 2 (two) times daily. (Patient not taking: Reported on 09/29/2018) 14 capsule 0  . omeprazole (PRILOSEC) 40 MG capsule Take 1 capsule (40 mg total) by mouth 2 (two) times daily. 180 capsule 0  . traMADol (ULTRAM) 50 MG tablet Take 1 tablet (50 mg total) by mouth every 12 (twelve) hours as needed. (Patient not taking: Reported on 08/03/2018) 12 tablet 0  . valsartan-hydrochlorothiazide (DIOVAN-HCT) 320-25 MG tablet Take 1 tablet by mouth daily.     No current facility-administered medications for this visit.     Allergies as of 01/14/2019 - Review Complete 01/14/2019  Allergen  Reaction Noted  . Codeine Anaphylaxis and Other (See Comments) 07/23/2010    Review of Systems:    All systems reviewed and negative except where noted in HPI.   Observations/Objective:  Labs: CMP     Component Value Date/Time   NA 138 05/11/2018 1747   K 3.2 (L) 05/11/2018 1747   CL 100 05/11/2018 1747   CO2 27 05/11/2018 1747   GLUCOSE 149 (H) 05/11/2018 1747   BUN 15 05/11/2018 1747   CREATININE 0.74 05/11/2018 1747   CALCIUM 9.3 05/11/2018 1747   PROT 7.6 05/11/2018 1747   ALBUMIN 3.9 05/11/2018 1747   AST 21 05/11/2018 1747    ALT 11 05/11/2018 1747   ALKPHOS 56 05/11/2018 1747   BILITOT 0.4 05/11/2018 1747   GFRNONAA >60 05/11/2018 1747   GFRAA >60 05/11/2018 1747   Lab Results  Component Value Date   WBC 6.7 05/11/2018   HGB 11.8 (L) 05/11/2018   HCT 35.2 05/11/2018   MCV 84.2 05/11/2018   PLT 329 05/11/2018    Imaging Studies: No results found.  Assessment and Plan:   Heather Small is a 83 y.o. y/o female  here to follow up for dysphagia. S/p Dilation x 4 upto 15 mm . Doing well   Plan  1.. Decrease dose to Prilosec 40 mg once a day if taking twice (she was unsure of the dose and will check later)   I discussed the assessment and treatment plan with the patient. The patient was provided an opportunity to ask questions and all were answered. The patient agreed with the plan and demonstrated an understanding of the instructions.   The patient was advised to call back or seek an in-person evaluation if the symptoms worsen or if the condition fails to improve as anticipated.  I provided 5 minutes of non-face-to-face time during this encounter.  Dr Jonathon Bellows MD,MRCP [facility]) Gastroenterology/Hepatology Pager: [phone]   Speech recognition software was used to dictate this note.

## 2019-01-18 ENCOUNTER — Ambulatory Visit: Payer: Medicare PPO | Admitting: Gastroenterology

## 2019-09-12 ENCOUNTER — Ambulatory Visit
Admission: EM | Admit: 2019-09-12 | Discharge: 2019-09-12 | Disposition: A | Discharge status: Home / Self Care | Payer: Medicare PPO | Attending: Family Medicine | Admitting: Family Medicine

## 2019-09-12 ENCOUNTER — Other Ambulatory Visit: Payer: Self-pay

## 2019-09-12 DIAGNOSIS — R197 Diarrhea, unspecified: Secondary | ICD-10-CM

## 2019-09-12 DIAGNOSIS — R350 Frequency of micturition: Secondary | ICD-10-CM

## 2019-09-12 LAB — URINALYSIS, COMPLETE (UACMP) WITH MICROSCOPIC
Bacteria, UA: NONE SEEN
Bilirubin Urine: NEGATIVE
Glucose, UA: NEGATIVE mg/dL
Hgb urine dipstick: NEGATIVE
Ketones, ur: NEGATIVE mg/dL
Leukocytes,Ua: NEGATIVE
Nitrite: NEGATIVE
Protein, ur: NEGATIVE mg/dL
RBC / HPF: NONE SEEN RBC/hpf (ref 0–5)
Specific Gravity, Urine: 1.015 (ref 1.005–1.030)
WBC, UA: NONE SEEN WBC/hpf (ref 0–5)
pH: 7 (ref 5.0–8.0)

## 2019-09-12 LAB — BASIC METABOLIC PANEL
Anion gap: 4 — ABNORMAL LOW (ref 5–15)
BUN: 14 mg/dL (ref 8–23)
CO2: 29 mmol/L (ref 22–32)
Calcium: 9.6 mg/dL (ref 8.9–10.3)
Chloride: 102 mmol/L (ref 98–111)
Creatinine, Ser: 0.75 mg/dL (ref 0.44–1.00)
GFR calc Af Amer: >60 mL/min (ref >60)
GFR calc non Af Amer: >60 mL/min (ref >60)
Glucose, Bld: 151 mg/dL — ABNORMAL HIGH (ref 70–99)
Potassium: 4.3 mmol/L (ref 3.5–5.1)
Sodium: 135 mmol/L (ref 135–145)

## 2019-09-12 NOTE — ED Triage Notes (Addendum)
Pt presents with c/o diarrhea for several weeks, which seems to come and go. She reports having BM's at least twice per day, some are very loose and watery. Pt reports some mild lower abd pain. She denies n/v/fever/chills. She also reports urinary frequency but denies any dysuria/hemturia. Pt denies blood in stools and states has normal appetite.

## 2019-09-12 NOTE — ED Provider Notes (Signed)
[facility] ____________________________________________  Time seen: Approximately 4:01 PM  I have reviewed the triage vital signs and the nursing notes.   HISTORY  Chief Complaint Diarrhea   HPI Heather Small is a 84 y.o. female past medical history of diabetes, hypertension, hyperlipidemia presenting for evaluation of diarrhea.  Reports she has had diarrhea intermittently for the last 2 to 3 weeks.  Reports diarrhea episodes are described as loose stool and watery approximately twice per day.  Has also had some urinary frequency but denies any urinary burning or abnormal color.  Denies abnormal colored stools or bloody or black stool.  Continues to eat and drink well.  Has had occasional mid lower abdominal pressure.  Denies any other abdominal pain.  Denies current abdominal pain.  No accompanying fevers.  Denies recent medication changes.  Denies aggravating or alleviating factors.  Denies recent medication changes.  Samara Deist, MD : PCP   Past Medical History:  Diagnosis Date  . Diabetes mellitus without complication ([place])   . Hyperlipemia   . Hypertension   . Hypothyroidism   . Thyroid disease     Patient Active Problem List   Diagnosis Date Noted  . Stricture esophagus   . Sepsis ([place]) 02/18/2017  . Stroke [facility]) 07/01/2015    Past Surgical History:  Procedure Laterality Date  . COLONOSCOPY    . ESOPHAGEAL DILATION    . ESOPHAGOGASTRODUODENOSCOPY (EGD) WITH PROPOFOL N/A 07/13/2018   Procedure: ESOPHAGOGASTRODUODENOSCOPY (EGD) WITH PROPOFOL;  Surgeon: Jonathon Bellows, MD;  Location: [facility] ENDOSCOPY;  Service: Gastroenterology;  Laterality: N/A;  . ESOPHAGOGASTRODUODENOSCOPY (EGD) WITH PROPOFOL N/A 07/30/2018   Procedure: ESOPHAGOGASTRODUODENOSCOPY (EGD) WITH PROPOFOL;  Surgeon: Jonathon Bellows, MD;  Location: [facility] ENDOSCOPY;  Service: Gastroenterology;  Laterality: N/A;  . ESOPHAGOGASTRODUODENOSCOPY (EGD) WITH PROPOFOL N/A 08/18/2018   Procedure: EGD with  Dilation;  Surgeon: Jonathon Bellows, MD;  Location: [facility] ENDOSCOPY;  Service: Gastroenterology;  Laterality: N/A;  . ESOPHAGOGASTRODUODENOSCOPY (EGD) WITH PROPOFOL N/A 09/29/2018   Procedure: ESOPHAGOGASTRODUODENOSCOPY (EGD) WITH PROPOFOL;  Surgeon: Jonathon Bellows, MD;  Location: [facility] ENDOSCOPY;  Service: Gastroenterology;  Laterality: N/A;  . MASTECTOMY Left      No current facility-administered medications for this encounter.  Current Outpatient Medications:  .  AZOPT 1 % ophthalmic suspension, Place 1 drop into both eyes 2 (two) times daily., Disp: , Rfl:  .  brimonidine (ALPHAGAN) 0.15 % ophthalmic solution, Place 1 drop into both eyes 2 (two) times daily., Disp: , Rfl:  .  levothyroxine (SYNTHROID, LEVOTHROID) 88 MCG tablet, Take 1 tablet by mouth daily., Disp: , Rfl:  .  losartan-hydrochlorothiazide (HYZAAR) 100-25 MG tablet, losartan 100 mg-hydrochlorothiazide 25 mg tablet, Disp: , Rfl:  .  LUMIGAN 0.01 % SOLN, Place 1 drop into both eyes at bedtime., Disp: , Rfl:  .  metFORMIN (GLUCOPHAGE) 500 MG tablet, Take 1 tablet by mouth 2 (two) times daily., Disp: , Rfl:  .  Multiple Vitamin (MULTIVITAMIN) tablet, Take 1 tablet by mouth daily., Disp: , Rfl:  .  simvastatin (ZOCOR) 20 MG tablet, Take 1 tablet by mouth at bedtime., Disp: , Rfl:  .  timolol (TIMOPTIC) 0.5 % ophthalmic solution, Place 1 drop into both eyes 2 (two) times daily., Disp: , Rfl:  .  traMADol (ULTRAM) 50 MG tablet, Take 1 tablet (50 mg total) by mouth every 12 (twelve) hours as needed., Disp: 12 tablet, Rfl: 0 .  benzonatate (TESSALON) 200 MG capsule, Take 1 capsule (200 mg total) by mouth every 8 (eight) hours. (Patient not taking: Reported  on 08/03/2018), Disp: 21 capsule, Rfl: 0 .  cephALEXin (KEFLEX) 500 MG capsule, Take 1 capsule (500 mg total) by mouth 2 (two) times daily. (Patient not taking: Reported on 09/29/2018), Disp: 14 capsule, Rfl: 0 .  omeprazole (PRILOSEC) 40 MG capsule, Take 1 capsule (40 mg total) by mouth 2  (two) times daily., Disp: 180 capsule, Rfl: 0 .  valsartan-hydrochlorothiazide (DIOVAN-HCT) 320-25 MG tablet, Take 1 tablet by mouth daily., Disp: , Rfl:   Allergies Codeine  Family History  Problem Relation Age of Onset  . Diabetes Other   . Heart Problems Mother   . Cancer Father     Social History Social History   Tobacco Use  . Smoking status: Never Smoker  . Smokeless tobacco: Never Used  Substance Use Topics  . Alcohol use: No  . Drug use: No    Review of Systems Constitutional: No fever ENT: No sore throat. Cardiovascular: Denies chest pain. Respiratory: Denies shortness of breath. Gastrointestinal: No nausea, no vomiting.  Positive diarrhea.  No constipation. Genitourinary: Negative for dysuria. Musculoskeletal: Negative for back pain. Skin: Negative for rash. Neurological: Negative for focal weakness or numbness.   ____________________________________________   PHYSICAL EXAM:  VITAL SIGNS: ED Triage Vitals  Enc Vitals Group     BP 09/12/19 1549 (!) 183/94     Pulse Rate 09/12/19 1549 95     Resp -- 18     Temp 09/12/19 1549 98.2 F (36.8 C)     Temp Source 09/12/19 1549 Oral     SpO2 09/12/19 1549 99 %     Weight 09/12/19 1546 148 lb (67.1 kg)     Height 09/12/19 1546 5\' 3"  (1.6 m)     Head Circumference --      Peak Flow --      Pain Score 09/12/19 1546 1     Pain Loc --      Pain Edu? --      Excl. in [place]? --    Vitals:   09/12/19 1546 09/12/19 1549  BP:  (!) 183/94  Pulse:  95  Temp:  98.2 F (36.8 C)  TempSrc:  Oral  SpO2:  99%  Weight: 148 lb (67.1 kg)   Height: 5\' 3"  (1.6 m)     Constitutional: Alert and oriented. Well appearing and in no acute distress. Eyes: Conjunctivae are normal.  ENT      Head: Normocephalic and atraumatic. Cardiovascular: Normal rate, regular rhythm. Grossly normal heart sounds. Good peripheral circulation. Respiratory: Normal respiratory effort without tachypnea nor retractions. Breath sounds are clear  and equal bilaterally. No wheezes, rales, rhonchi. Gastrointestinal: Soft and nontender. Normal Bowel sounds. No CVA tenderness. Musculoskeletal: Steady gait.  Neurologic:  Normal speech and language. Speech is normal. No gait instability.  Skin:  Skin is warm, dry and intact. No rash noted. Psychiatric: Mood and affect are normal. Speech and behavior are normal. Patient exhibits appropriate insight and judgment   ___________________________________________   LABS (all labs ordered are listed, but only abnormal results are displayed)  Labs Reviewed  BASIC METABOLIC PANEL - Abnormal; Notable for the following components:      Result Value   Glucose, Bld 151 (*)    Anion gap 4 (*)    All other components within normal limits  URINALYSIS, COMPLETE (UACMP) WITH MICROSCOPIC    RADIOLOGY  No results found. ____________________________________________   PROCEDURES Procedures   INITIAL IMPRESSION / ASSESSMENT AND PLAN / ED COURSE  Pertinent labs & imaging results that  were available during my care of the patient were reviewed by me and considered in my medical decision making (see chart for details).  Well-appearing patient.  No acute distress.  Diarrhea complaints as well as urinary frequency.  Urinalysis unremarkable.  BMP reviewed.  GI panel with PCR and C. difficile order to be completed.  Patient states she will return stool samples.  Also discussed possibility of medication side effect.  Counseled to closely monitor, continue to stay hydrated.  And to follow-up with her primary care regarding the diarrhea.  Discussed follow up with Primary care physician this week. Discussed follow up and return parameters including no resolution or any worsening concerns. Patient verbalized understanding and agreed to plan.   ____________________________________________   FINAL CLINICAL IMPRESSION(S) / ED DIAGNOSES  Final diagnoses:  Diarrhea, unspecified type  Urinary frequency      ED Discharge Orders    None       Note: This dictation was prepared with Dragon dictation along with smaller phrase technology. Any transcriptional errors that result from this process are unintentional.         Marylene Land, NP 09/12/19 [phone]

## 2019-09-12 NOTE — Discharge Instructions (Addendum)
Monitor. Drink plenty of fluids.  Return stool sample.  Follow up with your primary care physician this week as discussed. return to Urgent care or ER for new or worsening concerns.

## 2019-09-13 ENCOUNTER — Other Ambulatory Visit
Admission: RE | Admit: 2019-09-13 | Discharge: 2019-09-13 | Disposition: A | Discharge status: Home / Self Care | Payer: Medicare PPO | Source: Ambulatory Visit | Attending: Family Medicine | Admitting: Family Medicine

## 2019-09-13 DIAGNOSIS — R197 Diarrhea, unspecified: Secondary | ICD-10-CM | POA: Insufficient documentation

## 2019-09-13 LAB — C DIFFICILE QUICK SCREEN W PCR REFLEX
C Diff antigen: NEGATIVE
C Diff interpretation: NOT DETECTED
C Diff toxin: NEGATIVE

## 2019-09-16 LAB — GI PATHOGEN PANEL BY PCR, STOOL

## 2019-11-02 ENCOUNTER — Other Ambulatory Visit: Payer: Self-pay

## 2019-11-02 ENCOUNTER — Ambulatory Visit (INDEPENDENT_AMBULATORY_CARE_PROVIDER_SITE_OTHER): Payer: Medicare PPO | Admitting: Podiatry

## 2019-11-02 DIAGNOSIS — M792 Neuralgia and neuritis, unspecified: Secondary | ICD-10-CM

## 2019-11-02 DIAGNOSIS — E1142 Type 2 diabetes mellitus with diabetic polyneuropathy: Secondary | ICD-10-CM

## 2019-11-02 MED ORDER — GABAPENTIN 100 MG PO CAPS: 100.0000 mg | ORAL_CAPSULE | Freq: Three times a day (TID) | ORAL | 3 refills | Status: DC

## 2019-11-03 ENCOUNTER — Encounter: Payer: Self-pay | Admitting: Podiatry

## 2019-11-03 NOTE — Progress Notes (Signed)
Subjective:  Patient ID: Heather Small, female    DOB: [birth year],  MRN: [id number]  Chief Complaint  Patient presents with  . Foot Pain    pt is here for bil numbness and tingling in both feet. Pt states that it is primarily located on the top of both feet. pt states that it has been going on for 3-5 months.    84 y.o. female presents with the above complaint.  Patient presents with numbness and tingling to the both feet.  It has been going on for 3 to 5 months.  Patient states the dorsal part of both of the feet as well as the plantar part.  Patient states is gotten worse over time.  This started with the left foot and then got to the right foot.  Patient has tried taking antibiotics for her right leg restless leg syndrome but has not helped.  Patient is a diabetic with last A1c that is 7.1.  She has been trying to control her sugars.  She denies any other acute complaints.   Review of Systems: Negative except as noted in the HPI. Denies N/V/F/Ch.  Past Medical History:  Diagnosis Date  . Diabetes mellitus without complication ([place])   . Hyperlipemia   . Hypertension   . Hypothyroidism   . Thyroid disease     Current Outpatient Medications:  .  AZOPT 1 % ophthalmic suspension, Place 1 drop into both eyes 2 (two) times daily., Disp: , Rfl:  .  benzonatate (TESSALON) 200 MG capsule, Take 1 capsule (200 mg total) by mouth every 8 (eight) hours., Disp: 21 capsule, Rfl: 0 .  brimonidine (ALPHAGAN) 0.15 % ophthalmic solution, Place 1 drop into both eyes 2 (two) times daily., Disp: , Rfl:  .  cephALEXin (KEFLEX) 500 MG capsule, Take 1 capsule (500 mg total) by mouth 2 (two) times daily., Disp: 14 capsule, Rfl: 0 .  levothyroxine (SYNTHROID, LEVOTHROID) 88 MCG tablet, Take 1 tablet by mouth daily., Disp: , Rfl:  .  losartan-hydrochlorothiazide (HYZAAR) 100-25 MG tablet, losartan 100 mg-hydrochlorothiazide 25 mg tablet, Disp: , Rfl:  .  LUMIGAN 0.01 % SOLN, Place 1 drop into both eyes at  bedtime., Disp: , Rfl:  .  metFORMIN (GLUCOPHAGE) 500 MG tablet, Take 1 tablet by mouth 2 (two) times daily., Disp: , Rfl:  .  Multiple Vitamin (MULTIVITAMIN) tablet, Take 1 tablet by mouth daily., Disp: , Rfl:  .  simvastatin (ZOCOR) 20 MG tablet, Take 1 tablet by mouth at bedtime., Disp: , Rfl:  .  timolol (TIMOPTIC) 0.5 % ophthalmic solution, Place 1 drop into both eyes 2 (two) times daily., Disp: , Rfl:  .  traMADol (ULTRAM) 50 MG tablet, Take 1 tablet (50 mg total) by mouth every 12 (twelve) hours as needed., Disp: 12 tablet, Rfl: 0 .  valsartan-hydrochlorothiazide (DIOVAN-HCT) 320-25 MG tablet, Take 1 tablet by mouth daily., Disp: , Rfl:  .  gabapentin (NEURONTIN) 100 MG capsule, Take 1 capsule (100 mg total) by mouth 3 (three) times daily., Disp: 90 capsule, Rfl: 3 .  omeprazole (PRILOSEC) 40 MG capsule, Take 1 capsule (40 mg total) by mouth 2 (two) times daily., Disp: 180 capsule, Rfl: 0  Social History   Tobacco Use  Smoking Status Never Smoker  Smokeless Tobacco Never Used    Allergies  Allergen Reactions  . Codeine Anaphylaxis and Other (See Comments)    Couldn't swallow, was rushed to hospital   Objective:  There were no vitals filed for this visit. There is  no height or weight on file to calculate BMI. Constitutional Well developed. Well nourished.  Vascular Dorsalis pedis pulses palpable bilaterally. Posterior tibial pulses palpable bilaterally. Capillary refill normal to all digits.  No cyanosis or clubbing noted. Pedal hair growth normal.  Neurologic Normal speech. Oriented to person, place, and time. Decreased sensation to the light touch to the distal tips of the digits via [employer] monofilament testing.  Decreased protective sensation noted bilaterally.  Subjective complaints of numbness and tingling to both of the lower extremity noted  Dermatologic Nails well groomed and normal in appearance. No open wounds. No skin lesions.  Orthopedic:  Manual muscle testing  5 out of 5.  Good extensor and flexor range of motion noted.   Radiographs: None Assessment:   1. Neuropathic pain   2. Type 2 diabetes mellitus with diabetic polyneuropathy, without long-term current use of insulin ([place])    Plan:  Patient was evaluated and treated and all questions answered.  Bilateral plantar foot neuropathic pain -I explained to the patient the etiology of neuropathic pain especially in the setting of uncontrolled diabetes.  Given that patient is recent A1c is 7.1, this is likely due to the etiology of the numbness that she is experiencing.  I explained to her that the best way to really decrease to numbness tingling/neuropathic pain is to control her sugars with diet.  I discussed with her in extensive detail the type of diet that she should be on. -I also believe that patient will benefit from gabapentin to help decrease the neuropathic pain.  I will start her on-100 mg of gabapentin 3 times a day.   -If her pain is not resolved with gabapentin or help decreased I will consider doing a local neuropathic cream as well.  No follow-ups on file.

## 2019-12-01 ENCOUNTER — Ambulatory Visit (INDEPENDENT_AMBULATORY_CARE_PROVIDER_SITE_OTHER): Payer: Medicare PPO | Admitting: Gastroenterology

## 2019-12-01 DIAGNOSIS — K529 Noninfective gastroenteritis and colitis, unspecified: Secondary | ICD-10-CM

## 2019-12-01 NOTE — Progress Notes (Signed)
Jonathon Bellows MD, MRCP(U.K) [address]  [place]  [place], [place] [address]  Main: [phone]  Fax: [phone]   Primary Care Physician: Samara Deist, MD  Primary Gastroenterologist:  Dr. Jonathon Bellows   Diarrhea: Telephone visit  Virtual Visit via Telephone Note  I connected with patient on 12/01/19 at 10:30 AM EDT by telephone and verified that I am speaking with the correct person using two identifiers.   I discussed the limitations, risks, security and privacy concerns of performing an evaluation and management service by telephone and the availability of in person appointments. I also discussed with the patient that there may be a patient responsible charge related to this service. The patient expressed understanding and agreed to proceed.  Location of Patient: Home Location of Provider: Home Persons involved: Patient and provider only  HPI: Salihah Kacer is a 84 y.o. female    Summary of history :   She was last seen at my office in June 2020 for esophageal stricture.  Initial stricture of 10 mm dilated eventually to 15 mm  07/13/18: EGD : tight stricture of the esophagus- dilated to 10 mm , large hiatal hernia 07/30/18 : EGD: restonsis of the stricture dilated to 11 mm 08/18/2018: EGD_ dilated stricture to 12 mm 09/29/2018 : EGD: dilated to 15 mm .  Interval history   01/14/2019-12/01/2019  Presented to the hospital in January 2021 with diarrhea of 2 to 3 weeks duration discharged the same day stool test for GI PCR and C. difficile were negative.No recent labs.  Says diarrhea ongoing for a year usually after a meal.  No blood.  Cannot recollect the last colonoscopy that she had.  Current Outpatient Medications  Medication Sig Dispense Refill  . AZOPT 1 % ophthalmic suspension Place 1 drop into both eyes 2 (two) times daily.    . benzonatate (TESSALON) 200 MG capsule Take 1 capsule (200 mg total) by mouth every 8 (eight) hours. 21 capsule 0  . brimonidine  (ALPHAGAN) 0.15 % ophthalmic solution Place 1 drop into both eyes 2 (two) times daily.    . cephALEXin (KEFLEX) 500 MG capsule Take 1 capsule (500 mg total) by mouth 2 (two) times daily. 14 capsule 0  . gabapentin (NEURONTIN) 100 MG capsule Take 1 capsule (100 mg total) by mouth 3 (three) times daily. 90 capsule 3  . levothyroxine (SYNTHROID, LEVOTHROID) 88 MCG tablet Take 1 tablet by mouth daily.    Marland Kitchen losartan-hydrochlorothiazide (HYZAAR) 100-25 MG tablet losartan 100 mg-hydrochlorothiazide 25 mg tablet    . LUMIGAN 0.01 % SOLN Place 1 drop into both eyes at bedtime.    . metFORMIN (GLUCOPHAGE) 500 MG tablet Take 1 tablet by mouth 2 (two) times daily.    . Multiple Vitamin (MULTIVITAMIN) tablet Take 1 tablet by mouth daily.    Marland Kitchen omeprazole (PRILOSEC) 40 MG capsule Take 1 capsule (40 mg total) by mouth 2 (two) times daily. 180 capsule 0  . simvastatin (ZOCOR) 20 MG tablet Take 1 tablet by mouth at bedtime.    . timolol (TIMOPTIC) 0.5 % ophthalmic solution Place 1 drop into both eyes 2 (two) times daily.    . traMADol (ULTRAM) 50 MG tablet Take 1 tablet (50 mg total) by mouth every 12 (twelve) hours as needed. 12 tablet 0  . valsartan-hydrochlorothiazide (DIOVAN-HCT) 320-25 MG tablet Take 1 tablet by mouth daily.     No current facility-administered medications for this visit.    Allergies as of 12/01/2019 - Review Complete 11/03/2019  Allergen Reaction Noted  . Codeine Anaphylaxis and Other (See Comments) 07/23/2010    ROS:  General: Negative for anorexia, weight loss, fever, chills, fatigue, weakness. ENT: Negative for hoarseness, difficulty swallowing , nasal congestion. CV: Negative for chest pain, angina, palpitations, dyspnea on exertion, peripheral edema.  Respiratory: Negative for dyspnea at rest, dyspnea on exertion, cough, sputum, wheezing.  GI: See history of present illness. GU:  Negative for dysuria, hematuria, urinary incontinence, urinary frequency, nocturnal urination.    Endo: Negative for unusual weight change.    Physical Examination:   There were no vitals taken for this visit.  Telephone visit hence no examination findings   Imaging Studies: No results found.  Assessment and Plan:   Kameron Bade is a 84 y.o. y/o female for chronic diarrhea ongoing for over a year.  Stool studies a few months back were negative for infection.  No bleeding.  Differentials include medication induced from Metformin.  Obviously that other causes such as neoplasm or microscopic colitis.  Discussed that probably the easiest option at this point of time is to go off Metformin for a period of time  and if there is resolution of the diarrhea then no further testing needs to be done, if the diarrhea persists despite going off the Metformin then she would require a colonoscopy to rule out colitis as well as neoplasm.  She will get in touch with her primary care physician to go off the Metformin.    Dr Jonathon Bellows  MD,MRCP [facility]) Follow up in 3 to 4 weeks telephone visit

## 2020-01-19 ENCOUNTER — Telehealth (INDEPENDENT_AMBULATORY_CARE_PROVIDER_SITE_OTHER): Payer: Medicare PPO | Admitting: Gastroenterology

## 2020-01-19 DIAGNOSIS — K521 Toxic gastroenteritis and colitis: Secondary | ICD-10-CM

## 2020-01-19 DIAGNOSIS — R197 Diarrhea, unspecified: Secondary | ICD-10-CM

## 2020-01-19 NOTE — Progress Notes (Signed)
Jonathon Bellows , MD [address]  [place]  [place], [place] [address]  Main: [phone]  Fax: [phone]   Primary Care Physician: Samara Deist, MD  Virtual Visit via Telephone Note  I connected with patient on 01/19/20 at  9:00 AM EDT by telephone and verified that I am speaking with the correct person using two identifiers.   I discussed the limitations, risks, security and privacy concerns of performing an evaluation and management service by telephone and the availability of in person appointments. I also discussed with the patient that there may be a patient responsible charge related to this service. The patient expressed understanding and agreed to proceed.  Location of Patient: Home Location of Provider: Home Persons involved: Patient and provider only   History of Present Illness:   Diarrhea follow-up  HPI: Heather Small is a 84 y.o. female   Summary of history :   Initially seen and June 2020 for esophageal stricture.  Initial stricture of 10 mm dilated eventually to 15 mm  07/13/18: EGD : tight stricture of the esophagus- dilated to 10 mm , large hiatal hernia 07/30/18 : EGD: restonsis of the stricture dilated to 11 mm 08/18/2018: EGD_ dilated stricture to 12 mm 09/29/2018 : EGD: dilated to 15 mm.  Presented to the hospital in January 2021 with diarrhea of 2 to 3 weeks duration discharged the same day stool test for GI PCR and C. difficile were negative.No recent labs.  Says diarrhea ongoing for a year usually after a meal.  No blood.  Cannot recollect the last colonoscopy that she had.   Interval history  12/01/2019-01/19/2020  She says that once she went off the Metformin her bowel movements almost returned back to normal.  Still have slightly loose at this point of time she thinks it is resolving.     Current Outpatient Medications  Medication Sig Dispense Refill  . AZOPT 1 % ophthalmic suspension Place 1 drop into both eyes 2 (two)  times daily.    . benzonatate (TESSALON) 200 MG capsule Take 1 capsule (200 mg total) by mouth every 8 (eight) hours. 21 capsule 0  . brimonidine (ALPHAGAN) 0.15 % ophthalmic solution Place 1 drop into both eyes 2 (two) times daily.    . cephALEXin (KEFLEX) 500 MG capsule Take 1 capsule (500 mg total) by mouth 2 (two) times daily. 14 capsule 0  . gabapentin (NEURONTIN) 100 MG capsule Take 1 capsule (100 mg total) by mouth 3 (three) times daily. 90 capsule 3  . levothyroxine (SYNTHROID, LEVOTHROID) 88 MCG tablet Take 1 tablet by mouth daily.    Marland Kitchen losartan-hydrochlorothiazide (HYZAAR) 100-25 MG tablet losartan 100 mg-hydrochlorothiazide 25 mg tablet    . LUMIGAN 0.01 % SOLN Place 1 drop into both eyes at bedtime.    . metFORMIN (GLUCOPHAGE) 500 MG tablet Take 1 tablet by mouth 2 (two) times daily.    . Multiple Vitamin (MULTIVITAMIN) tablet Take 1 tablet by mouth daily.    Marland Kitchen omeprazole (PRILOSEC) 40 MG capsule Take 1 capsule (40 mg total) by mouth 2 (two) times daily. 180 capsule 0  . simvastatin (ZOCOR) 20 MG tablet Take 1 tablet by mouth at bedtime.    . timolol (TIMOPTIC) 0.5 % ophthalmic solution Place 1 drop into both eyes 2 (two) times daily.    . traMADol (ULTRAM) 50 MG tablet Take 1 tablet (50 mg total) by mouth every 12 (twelve) hours as needed. 12 tablet 0  . valsartan-hydrochlorothiazide (DIOVAN-HCT) 320-25 MG tablet  Take 1 tablet by mouth daily.     No current facility-administered medications for this visit.    Allergies as of 01/19/2020 - Review Complete 11/03/2019  Allergen Reaction Noted  . Codeine Anaphylaxis and Other (See Comments) 07/23/2010    Review of Systems:    All systems reviewed and negative except where noted in HPI.   Observations/Objective:  Labs: CMP     Component Value Date/Time   NA 135 09/12/2019 1556   K 4.3 09/12/2019 1556   CL 102 09/12/2019 1556   CO2 29 09/12/2019 1556   GLUCOSE 151 (H) 09/12/2019 1556   BUN 14 09/12/2019 1556   CREATININE  0.75 09/12/2019 1556   CALCIUM 9.6 09/12/2019 1556   PROT 7.6 05/11/2018 1747   ALBUMIN 3.9 05/11/2018 1747   AST 21 05/11/2018 1747   ALT 11 05/11/2018 1747   ALKPHOS 56 05/11/2018 1747   BILITOT 0.4 05/11/2018 1747   GFRNONAA >60 09/12/2019 1556   GFRAA >60 09/12/2019 1556   Lab Results  Component Value Date   WBC 6.7 05/11/2018   HGB 11.8 (L) 05/11/2018   HCT 35.2 05/11/2018   MCV 84.2 05/11/2018   PLT 329 05/11/2018    Imaging Studies: No results found.  Assessment and Plan:   Heather Small is a 84 y.o. y/o female here to follow-up for chronic diarrhea ongoing for over a year.  Stool studies a few months back were negative for infection.  No bleeding.    At her last visit we stopped her Metformin and since then her bowel movements are resolved and she only has 1 bowel movement a day after she eats.  She states that it is a bit softer than what she would expect but she thinks it is getting better gradually.  I informed her that if she does not get complete resolution of her symptoms in a few weeks time to call my office right away as she would require a colonoscopy to rule out neoplasm/microscopic colitis    I discussed the assessment and treatment plan with the patient. The patient was provided an opportunity to ask questions and all were answered. The patient agreed with the plan and demonstrated an understanding of the instructions.   The patient was advised to call back or seek an in-person evaluation if the symptoms worsen or if the condition fails to improve as anticipated.  I provided 11 minutes of non-face-to-face time during this encounter.  Dr Jonathon Bellows MD,MRCP [facility]) Gastroenterology/Hepatology Pager: [phone]   Speech recognition software was used to dictate this note.

## 2020-01-23 ENCOUNTER — Encounter: Payer: Self-pay | Admitting: Emergency Medicine

## 2020-01-23 ENCOUNTER — Ambulatory Visit
Admission: EM | Admit: 2020-01-23 | Discharge: 2020-01-23 | Disposition: A | Discharge status: Home / Self Care | Payer: Medicare PPO | Attending: Internal Medicine | Admitting: Internal Medicine

## 2020-01-23 ENCOUNTER — Other Ambulatory Visit: Payer: Self-pay

## 2020-01-23 DIAGNOSIS — I10 Essential (primary) hypertension: Secondary | ICD-10-CM

## 2020-01-23 DIAGNOSIS — E1165 Type 2 diabetes mellitus with hyperglycemia: Secondary | ICD-10-CM | POA: Insufficient documentation

## 2020-01-23 LAB — BASIC METABOLIC PANEL
Anion gap: 5 (ref 5–15)
BUN: 13 mg/dL (ref 8–23)
CO2: 31 mmol/L (ref 22–32)
Calcium: 9.5 mg/dL (ref 8.9–10.3)
Chloride: 103 mmol/L (ref 98–111)
Creatinine, Ser: 0.75 mg/dL (ref 0.44–1.00)
GFR calc Af Amer: >60 mL/min (ref >60)
GFR calc non Af Amer: >60 mL/min (ref >60)
Glucose, Bld: 237 mg/dL — ABNORMAL HIGH (ref 70–99)
Potassium: 4.9 mmol/L (ref 3.5–5.1)
Sodium: 139 mmol/L (ref 135–145)

## 2020-01-23 LAB — HEMOGLOBIN A1C
Hgb A1c MFr Bld: 7.9 % — ABNORMAL HIGH (ref 4.8–5.6)
Mean Plasma Glucose: 180.03 mg/dL

## 2020-01-23 LAB — GLUCOSE, CAPILLARY: Glucose-Capillary: 253 mg/dL — ABNORMAL HIGH (ref 70–99)

## 2020-01-23 MED ORDER — OXYBUTYNIN CHLORIDE ER 10 MG PO TB24: 10.0000 mg | ORAL_TABLET | Freq: Every day | ORAL | 0 refills | Status: DC

## 2020-01-23 MED ORDER — AMLODIPINE BESYLATE 5 MG PO TABS: 5.0000 mg | ORAL_TABLET | Freq: Every day | ORAL | 1 refills | Status: DC

## 2020-01-23 NOTE — ED Provider Notes (Addendum)
[facility]    CSN: [id number] Arrival date & time: 01/23/20  1415      History   Chief Complaint Chief Complaint  Patient presents with  . Dizziness  . Shortness of Breath    HPI Heather Small is a 84 y.o. female patient comes in to get her vital signs rechecked.  She has no symptoms at this time.  She denies any dizziness, shortness of breath at this time.  She has been having intermittent dizziness and shortness of breath over the past 5 weeks.  She is being investigated for diarrhea.  Her Metformin was discontinued as a result of the diarrhea.  Primary care physician is following that and plans to refer to gastroenterology for colonoscopy if the diarrhea does not resolve.  No abdominal pain or bloating.  Patient has urinary urgency which is not new.  She denies any dysuria.  She admits to having some frequency.  No fever or chills.  No flank pain.  The urinary symptoms is chronic from what the patient says. HPI  Past Medical History:  Diagnosis Date  . Diabetes mellitus without complication ([place])   . Hyperlipemia   . Hypertension   . Hypothyroidism   . Thyroid disease     Patient Active Problem List   Diagnosis Date Noted  . Stricture esophagus   . Sepsis ([place]) 02/18/2017  . Stroke [facility]) 07/01/2015    Past Surgical History:  Procedure Laterality Date  . COLONOSCOPY    . ESOPHAGEAL DILATION    . ESOPHAGOGASTRODUODENOSCOPY (EGD) WITH PROPOFOL N/A 07/13/2018   Procedure: ESOPHAGOGASTRODUODENOSCOPY (EGD) WITH PROPOFOL;  Surgeon: Jonathon Bellows, MD;  Location: [facility] ENDOSCOPY;  Service: Gastroenterology;  Laterality: N/A;  . ESOPHAGOGASTRODUODENOSCOPY (EGD) WITH PROPOFOL N/A 07/30/2018   Procedure: ESOPHAGOGASTRODUODENOSCOPY (EGD) WITH PROPOFOL;  Surgeon: Jonathon Bellows, MD;  Location: [facility] ENDOSCOPY;  Service: Gastroenterology;  Laterality: N/A;  . ESOPHAGOGASTRODUODENOSCOPY (EGD) WITH PROPOFOL N/A 08/18/2018   Procedure: EGD with Dilation;  Surgeon: Jonathon Bellows, MD;   Location: [facility] ENDOSCOPY;  Service: Gastroenterology;  Laterality: N/A;  . ESOPHAGOGASTRODUODENOSCOPY (EGD) WITH PROPOFOL N/A 09/29/2018   Procedure: ESOPHAGOGASTRODUODENOSCOPY (EGD) WITH PROPOFOL;  Surgeon: Jonathon Bellows, MD;  Location: [facility] ENDOSCOPY;  Service: Gastroenterology;  Laterality: N/A;  . MASTECTOMY Left     OB History   No obstetric history on file.      Home Medications    Prior to Admission medications   Medication Sig Start Date End Date Taking? Authorizing Provider  levothyroxine (SYNTHROID, LEVOTHROID) 88 MCG tablet Take 1 tablet by mouth daily. 06/05/15  Yes [provider]  losartan-hydrochlorothiazide (HYZAAR) 100-25 MG tablet losartan 100 mg-hydrochlorothiazide 25 mg tablet   Yes [provider]  Multiple Vitamin (MULTIVITAMIN) tablet Take 1 tablet by mouth daily.   Yes [provider]  omeprazole (PRILOSEC) 40 MG capsule Take 1 capsule (40 mg total) by mouth 2 (two) times daily. 07/13/18 01/23/20 Yes Jonathon Bellows, MD  simvastatin (ZOCOR) 20 MG tablet Take 1 tablet by mouth at bedtime. 06/15/15  Yes [provider]  amLODipine (NORVASC) 5 MG tablet Take 1 tablet (5 mg total) by mouth daily. 01/23/20   Tascha Casares, Myrene Galas, MD  AZOPT 1 % ophthalmic suspension Place 1 drop into both eyes 2 (two) times daily. 04/11/15   [provider]  brimonidine (ALPHAGAN) 0.15 % ophthalmic solution Place 1 drop into both eyes 2 (two) times daily. 04/11/15   [provider]  gabapentin (NEURONTIN) 100 MG capsule Take 1 capsule (100 mg total) by  mouth 3 (three) times daily. 11/02/19   Felipa Furnace, DPM  LUMIGAN 0.01 % SOLN Place 1 drop into both eyes at bedtime. 04/11/15   [provider]  oxybutynin (DITROPAN XL) 10 MG 24 hr tablet Take 1 tablet (10 mg total) by mouth at bedtime. 01/23/20   Audreena Sachdeva, Myrene Galas, MD  timolol (TIMOPTIC) 0.5 % ophthalmic solution Place 1 drop into both eyes 2 (two) times daily. 04/11/15   [provider]  metFORMIN (GLUCOPHAGE) 500 MG tablet Take 1 tablet by mouth 2 (two) times daily. 06/15/15 01/23/20  [provider]  valsartan-hydrochlorothiazide (DIOVAN-HCT) 320-25 MG tablet Take 1 tablet by mouth daily. 04/13/15 01/23/20  [provider]    Family History Family History  Problem Relation Age of Onset  . Diabetes Other   . Heart Problems Mother   . Cancer Father     Social History Social History   Tobacco Use  . Smoking status: Never Smoker  . Smokeless tobacco: Never Used  Vaping Use  . Vaping Use: Never used  Substance Use Topics  . Alcohol use: No  . Drug use: No     Allergies   Codeine   Review of Systems Review of Systems  Constitutional: Negative.   Respiratory: Negative.   Gastrointestinal: Negative.   Genitourinary: Positive for frequency and urgency.  Musculoskeletal: Negative.   Neurological: Positive for dizziness and light-headedness.     Physical Exam Triage Vital Signs ED Triage Vitals  Enc Vitals Group     BP 01/23/20 1446 (!) 208/88     Pulse Rate 01/23/20 1446 89     Resp 01/23/20 1446 14     Temp 01/23/20 1446 98.4 F (36.9 C)     Temp Source 01/23/20 1446 Oral     SpO2 01/23/20 1446 99 %     Weight 01/23/20 1443 147 lb (66.7 kg)     Height 01/23/20 1443 5\' 3"  (1.6 m)     Head Circumference --      Peak Flow --      Pain Score 01/23/20 1443 0     Pain Loc --      Pain Edu? --      Excl. in [place]? --    No data found.  Updated Vital Signs BP (!) 189/90 (BP Location: Right Arm)   Pulse 89   Temp 98.4 F (36.9 C) (Oral)   Resp 14   Ht 5\' 3"  (1.6 m)   Wt 66.7 kg   SpO2 99%   BMI 26.04 kg/m   Visual Acuity Right Eye Distance:   Left Eye Distance:   Bilateral Distance:    Right Eye Near:   Left Eye Near:    Bilateral Near:     Physical Exam Vitals and nursing note reviewed.  Constitutional:      General: She is not in acute distress.    Appearance: She is well-developed. She is not  ill-appearing.  Pulmonary:     Breath sounds: No decreased breath sounds, wheezing, rhonchi or rales.  Chest:     Chest wall: No mass, tenderness or crepitus.  Abdominal:     Palpations: Abdomen is soft. There is no hepatomegaly or splenomegaly.     Tenderness: There is no abdominal tenderness.  Musculoskeletal:     Cervical back: Normal range of motion and neck supple.  Neurological:     Mental Status: She is alert.      UC Treatments / Results  Labs (all labs  ordered are listed, but only abnormal results are displayed) Labs Reviewed  GLUCOSE, CAPILLARY - Abnormal; Notable for the following components:      Result Value   Glucose-Capillary 253 (*)    All other components within normal limits  BASIC METABOLIC PANEL  HEMOGLOBIN A1C  CBG MONITORING, ED    EKG   Radiology No results found.  Procedures Procedures (including critical care time)  Medications Ordered in UC Medications - No data to display  Initial Impression / Assessment and Plan / UC Course  I have reviewed the triage vital signs and the nursing notes.  Pertinent labs & imaging results that were available during my care of the patient were reviewed by me and considered in my medical decision making (see chart for details).     1.  Hypertension, uncontrolled Add amlodipine 5 mg orally daily to the current treatment regimen Patient is advised to follow-up with primary care physician for further blood pressure monitoring Return precautions given.  2.  Diabetes mellitus type 2, uncontrolled Metformin is currently on hold as patient is being evaluated for diarrhea Hopefully primary care physician will restart patient on an oral hypoglycemic agent. Hemoglobin A1c. Blood glucose checked in the urgent care was 253.  3.  Urinary incontinence: Start Ditropan Patient may benefit from gynecological evaluation to assess urge incontinence.. Final Clinical Impressions(s) / UC Diagnoses   Final diagnoses:    Hypertension, uncontrolled  Uncontrolled type 2 diabetes mellitus with hyperglycemia [facility])   Discharge Instructions   None    ED Prescriptions    Medication Sig Dispense Auth. Provider   amLODipine (NORVASC) 5 MG tablet Take 1 tablet (5 mg total) by mouth daily. 30 tablet Sande Pickert, Myrene Galas, MD   oxybutynin (DITROPAN XL) 10 MG 24 hr tablet Take 1 tablet (10 mg total) by mouth at bedtime. 30 tablet Teralyn Mullins, Myrene Galas, MD     PDMP not reviewed this encounter.   Chase Picket, MD 01/24/20 1156    Chase Picket, MD 01/24/20 [phone]

## 2020-01-23 NOTE — ED Triage Notes (Signed)
Patient c/o dizziness and SOB off and on for 5 weeks.  Patient states that she was having diarrhea and so her PCP stopped her Metformin over 5 weeks ago.

## 2020-02-08 ENCOUNTER — Encounter: Payer: Self-pay | Admitting: Podiatry

## 2020-02-08 ENCOUNTER — Other Ambulatory Visit: Payer: Self-pay

## 2020-02-08 ENCOUNTER — Ambulatory Visit (INDEPENDENT_AMBULATORY_CARE_PROVIDER_SITE_OTHER): Payer: Medicare PPO | Admitting: Podiatry

## 2020-02-08 DIAGNOSIS — M25471 Effusion, right ankle: Secondary | ICD-10-CM | POA: Diagnosis not present

## 2020-02-08 DIAGNOSIS — M79675 Pain in left toe(s): Secondary | ICD-10-CM

## 2020-02-08 DIAGNOSIS — E1142 Type 2 diabetes mellitus with diabetic polyneuropathy: Secondary | ICD-10-CM | POA: Diagnosis not present

## 2020-02-08 DIAGNOSIS — M79674 Pain in right toe(s): Secondary | ICD-10-CM | POA: Diagnosis not present

## 2020-02-08 DIAGNOSIS — M25472 Effusion, left ankle: Secondary | ICD-10-CM

## 2020-02-08 DIAGNOSIS — B351 Tinea unguium: Secondary | ICD-10-CM | POA: Diagnosis not present

## 2020-02-08 NOTE — Progress Notes (Signed)
  Subjective:  Patient ID: Heather Small, female    DOB: August 15, 1935,  MRN: [id number]  Chief Complaint  Patient presents with  . Peripheral Neuropathy    pt is here for a f/u of neuropathy to the right foot, pt states that the right foot is not getting better since the last time she was here. Pt is concerned with swelling in both of her feet. Pt has not been taking Gabapentin, that was prescribed, and has not improved overall diet.   84 y.o. female returns for the above complaint.  Patient presents with thickened elongated dystrophic digits atrophic discolored nails x10.  They are mild pain on palpation.  Patient has not been able to be resolved.  She would like to have was debrided down.  She denies any other acute complaints.  She is a diabetic with unknown last A1c.  She also secondary complaint of bilateral ankle swelling.  Patient has not tried anything for it.  She states that she has been on her feet because of summertime.  She states that this is likely the cause of it however she wanted to discuss treatment options.  She denies any other acute complaints  Objective:  There were no vitals filed for this visit. Podiatric Exam: Vascular: dorsalis pedis and posterior tibial pulses are palpable bilateral. Capillary return is immediate. Temperature gradient is WNL. Skin turgor WNL  Sensorium: Normal Semmes Weinstein monofilament test. Normal tactile sensation bilaterally. Nail Exam: Pt has thick disfigured discolored nails with subungual debris noted bilateral entire nail hallux through fifth toenails.  Pain on palpation to the nails. Ulcer Exam: There is no evidence of ulcer or pre-ulcerative changes or infection. Orthopedic Exam: Muscle tone and strength are WNL. No limitations in general ROM. No crepitus or effusions noted. HAV  B/L.  Hammer toes 2-5  B/L. Skin: No Porokeratosis. No infection or ulcers.  Bilateral 1+ pitting edema generalized circumferential around the ankle.  No ulcers  noted.    Assessment & Plan:   1. Type 2 diabetes mellitus with diabetic polyneuropathy, without long-term current use of insulin (HCC)   2. Pain due to onychomycosis of toenails of both feet   3. Ankle edema, bilateral     Patient was evaluated and treated and all questions answered.  Bilateral ankle swelling -I explained patient the etiology of ankle swelling and various treatment options were extensively discussed.  Patient primarily sits down in a dependent position leading to collection of fluid.  I explained to her that she needs to aggressively elevate and therefore decrease the amount of fluid that is being left behind in the ankle.  Patient agrees with the plan is to aggressively elevate.  Onychomycosis with pain  -Nails palliatively debrided as below. -Educated on self-care  Procedure: Nail Debridement Rationale: pain  Type of Debridement: manual, sharp debridement. Instrumentation: Nail nipper, rotary burr. Number of Nails: 10  Procedures and Treatment: Consent by patient was obtained for treatment procedures. The patient understood the discussion of treatment and procedures well. All questions were answered thoroughly reviewed. Debridement of mycotic and hypertrophic toenails, 1 through 5 bilateral and clearing of subungual debris. No ulceration, no infection noted.  Return Visit-Office Procedure: Patient instructed to return to the office for a follow up visit 3 months for continued evaluation and treatment.  Boneta Lucks, DPM    No follow-ups on file.

## 2020-04-25 ENCOUNTER — Emergency Department: Payer: Medicare PPO

## 2020-04-25 ENCOUNTER — Inpatient Hospital Stay
Admission: EM | Admit: 2020-04-25 | Discharge: 2020-04-28 | DRG: 871 | Disposition: A | Discharge status: Home / Self Care | Payer: Medicare PPO | Attending: Internal Medicine | Admitting: Internal Medicine

## 2020-04-25 DIAGNOSIS — E785 Hyperlipidemia, unspecified: Secondary | ICD-10-CM | POA: Diagnosis present

## 2020-04-25 DIAGNOSIS — Z79899 Other long term (current) drug therapy: Secondary | ICD-10-CM | POA: Diagnosis not present

## 2020-04-25 DIAGNOSIS — D6489 Other specified anemias: Secondary | ICD-10-CM | POA: Diagnosis present

## 2020-04-25 DIAGNOSIS — A419 Sepsis, unspecified organism: Secondary | ICD-10-CM

## 2020-04-25 DIAGNOSIS — A044 Other intestinal Escherichia coli infections: Secondary | ICD-10-CM | POA: Diagnosis present

## 2020-04-25 DIAGNOSIS — Z9012 Acquired absence of left breast and nipple: Secondary | ICD-10-CM

## 2020-04-25 DIAGNOSIS — Z20822 Contact with and (suspected) exposure to covid-19: Secondary | ICD-10-CM | POA: Diagnosis present

## 2020-04-25 DIAGNOSIS — R4781 Slurred speech: Secondary | ICD-10-CM | POA: Diagnosis present

## 2020-04-25 DIAGNOSIS — D649 Anemia, unspecified: Secondary | ICD-10-CM

## 2020-04-25 DIAGNOSIS — Z8673 Personal history of transient ischemic attack (TIA), and cerebral infarction without residual deficits: Secondary | ICD-10-CM

## 2020-04-25 DIAGNOSIS — Z7984 Long term (current) use of oral hypoglycemic drugs: Secondary | ICD-10-CM | POA: Diagnosis not present

## 2020-04-25 DIAGNOSIS — R197 Diarrhea, unspecified: Secondary | ICD-10-CM

## 2020-04-25 DIAGNOSIS — R948 Abnormal results of function studies of other organs and systems: Secondary | ICD-10-CM | POA: Diagnosis present

## 2020-04-25 DIAGNOSIS — D259 Leiomyoma of uterus, unspecified: Secondary | ICD-10-CM | POA: Diagnosis present

## 2020-04-25 DIAGNOSIS — Z833 Family history of diabetes mellitus: Secondary | ICD-10-CM

## 2020-04-25 DIAGNOSIS — E119 Type 2 diabetes mellitus without complications: Secondary | ICD-10-CM | POA: Diagnosis present

## 2020-04-25 DIAGNOSIS — R1909 Other intra-abdominal and pelvic swelling, mass and lump: Secondary | ICD-10-CM | POA: Diagnosis present

## 2020-04-25 DIAGNOSIS — R4182 Altered mental status, unspecified: Secondary | ICD-10-CM | POA: Diagnosis present

## 2020-04-25 DIAGNOSIS — K8020 Calculus of gallbladder without cholecystitis without obstruction: Secondary | ICD-10-CM | POA: Diagnosis present

## 2020-04-25 DIAGNOSIS — K635 Polyp of colon: Secondary | ICD-10-CM | POA: Diagnosis present

## 2020-04-25 DIAGNOSIS — A4151 Sepsis due to Escherichia coli [E. coli]: Secondary | ICD-10-CM | POA: Diagnosis present

## 2020-04-25 DIAGNOSIS — E039 Hypothyroidism, unspecified: Secondary | ICD-10-CM | POA: Diagnosis present

## 2020-04-25 DIAGNOSIS — Z885 Allergy status to narcotic agent status: Secondary | ICD-10-CM

## 2020-04-25 DIAGNOSIS — R748 Abnormal levels of other serum enzymes: Secondary | ICD-10-CM | POA: Diagnosis present

## 2020-04-25 DIAGNOSIS — Z79890 Hormone replacement therapy (postmenopausal): Secondary | ICD-10-CM | POA: Diagnosis not present

## 2020-04-25 DIAGNOSIS — K6389 Other specified diseases of intestine: Secondary | ICD-10-CM | POA: Diagnosis not present

## 2020-04-25 DIAGNOSIS — R6521 Severe sepsis with septic shock: Secondary | ICD-10-CM | POA: Diagnosis present

## 2020-04-25 DIAGNOSIS — R7881 Bacteremia: Secondary | ICD-10-CM | POA: Diagnosis not present

## 2020-04-25 DIAGNOSIS — I10 Essential (primary) hypertension: Secondary | ICD-10-CM | POA: Diagnosis present

## 2020-04-25 LAB — DIFFERENTIAL
Abs Immature Granulocytes: 0.06 10*3/uL (ref 0.00–0.07)
Basophils Absolute: 0 10*3/uL (ref 0.0–0.1)
Basophils Relative: 0 %
Eosinophils Absolute: 0.1 10*3/uL (ref 0.0–0.5)
Eosinophils Relative: 1 %
Immature Granulocytes: 0 %
Lymphocytes Relative: 4 %
Lymphs Abs: 0.5 10*3/uL — ABNORMAL LOW (ref 0.7–4.0)
Monocytes Absolute: 0.6 10*3/uL (ref 0.1–1.0)
Monocytes Relative: 4 %
Neutro Abs: 12.2 10*3/uL — ABNORMAL HIGH (ref 1.7–7.7)
Neutrophils Relative %: 91 %

## 2020-04-25 LAB — COMPREHENSIVE METABOLIC PANEL
ALT: 36 U/L (ref 0–44)
AST: 89 U/L — ABNORMAL HIGH (ref 15–41)
Albumin: 3.9 g/dL (ref 3.5–5.0)
Alkaline Phosphatase: 88 U/L (ref 38–126)
Anion gap: 11 (ref 5–15)
BUN: 14 mg/dL (ref 8–23)
CO2: 24 mmol/L (ref 22–32)
Calcium: 9.4 mg/dL (ref 8.9–10.3)
Chloride: 99 mmol/L (ref 98–111)
Creatinine, Ser: 0.8 mg/dL (ref 0.44–1.00)
GFR calc Af Amer: >60 mL/min (ref >60)
GFR calc non Af Amer: >60 mL/min (ref >60)
Glucose, Bld: 171 mg/dL — ABNORMAL HIGH (ref 70–99)
Potassium: 4 mmol/L (ref 3.5–5.1)
Sodium: 134 mmol/L — ABNORMAL LOW (ref 135–145)
Total Bilirubin: 0.9 mg/dL (ref 0.3–1.2)
Total Protein: 8.3 g/dL — ABNORMAL HIGH (ref 6.5–8.1)

## 2020-04-25 LAB — PROTIME-INR
INR: 0.9 (ref 0.8–1.2)
Prothrombin Time: 12.2 seconds (ref 11.4–15.2)

## 2020-04-25 LAB — CBC
HCT: 36.9 % (ref 36.0–46.0)
Hemoglobin: 12 g/dL (ref 12.0–15.0)
MCH: 27.5 pg (ref 26.0–34.0)
MCHC: 32.5 g/dL (ref 30.0–36.0)
MCV: 84.4 fL (ref 80.0–100.0)
Platelets: 328 10*3/uL (ref 150–400)
RBC: 4.37 MIL/uL (ref 3.87–5.11)
RDW: 12.5 % (ref 11.5–15.5)
WBC: 13.4 10*3/uL — ABNORMAL HIGH (ref 4.0–10.5)
nRBC: 0 % (ref 0.0–0.2)

## 2020-04-25 LAB — URINALYSIS, COMPLETE (UACMP) WITH MICROSCOPIC
Bacteria, UA: NONE SEEN
Bilirubin Urine: NEGATIVE
Glucose, UA: NEGATIVE mg/dL
Hgb urine dipstick: NEGATIVE
Ketones, ur: NEGATIVE mg/dL
Leukocytes,Ua: NEGATIVE
Nitrite: NEGATIVE
Protein, ur: NEGATIVE mg/dL
Specific Gravity, Urine: 1.014 (ref 1.005–1.030)
pH: 8 (ref 5.0–8.0)

## 2020-04-25 LAB — GLUCOSE, CAPILLARY
Glucose-Capillary: 157 mg/dL — ABNORMAL HIGH (ref 70–99)
Glucose-Capillary: 141 mg/dL — ABNORMAL HIGH (ref 70–99)

## 2020-04-25 LAB — SARS CORONAVIRUS 2 BY RT PCR (HOSPITAL ORDER, PERFORMED IN ~~LOC~~ HOSPITAL LAB): SARS Coronavirus 2: NEGATIVE

## 2020-04-25 LAB — LIPASE, BLOOD: Lipase: 133 U/L — ABNORMAL HIGH (ref 11–51)

## 2020-04-25 LAB — APTT: aPTT: 24 seconds (ref 24–36)

## 2020-04-25 LAB — TSH: TSH: 0.526 u[IU]/mL (ref 0.350–4.500)

## 2020-04-25 LAB — LACTIC ACID, PLASMA
Lactic Acid, Venous: 2.3 mmol/L (ref 0.5–1.9)
Lactic Acid, Venous: 6.7 mmol/L (ref 0.5–1.9)

## 2020-04-25 MED ORDER — SIMVASTATIN 10 MG PO TABS
20.0000 mg | ORAL_TABLET | Freq: Every day | ORAL | Status: DC
  Administered 2020-04-25: 20 mg via ORAL
  Filled 2020-04-25: qty 2

## 2020-04-25 MED ORDER — ONDANSETRON HCL 4 MG/2ML IJ SOLN: 4.0000 mg | Freq: Four times a day (QID) | INTRAMUSCULAR | Status: DC | PRN

## 2020-04-25 MED ORDER — VANCOMYCIN HCL IN DEXTROSE 1-5 GM/200ML-% IV SOLN: 1000.0000 mg | Freq: Once | INTRAVENOUS | Status: DC

## 2020-04-25 MED ORDER — ADULT MULTIVITAMIN W/MINERALS CH
1.0000 | ORAL_TABLET | Freq: Every day | ORAL | Status: DC
  Administered 2020-04-26 – 2020-04-28 (×3): 1 via ORAL
  Filled 2020-04-25 (×3): qty 1

## 2020-04-25 MED ORDER — DORZOLAMIDE HCL-TIMOLOL MAL 2-0.5 % OP SOLN
1.0000 [drp] | Freq: Two times a day (BID) | OPHTHALMIC | Status: DC
  Administered 2020-04-26 (×2): 1 [drp] via OPHTHALMIC
  Filled 2020-04-25: qty 10

## 2020-04-25 MED ORDER — VANCOMYCIN HCL 500 MG/100ML IV SOLN
500.0000 mg | Freq: Two times a day (BID) | INTRAVENOUS | Status: DC
  Administered 2020-04-26: 500 mg via INTRAVENOUS
  Filled 2020-04-25 (×2): qty 100

## 2020-04-25 MED ORDER — TIMOLOL MALEATE 0.5 % OP SOLN
1.0000 [drp] | Freq: Two times a day (BID) | OPHTHALMIC | Status: DC
  Administered 2020-04-26 (×2): 1 [drp] via OPHTHALMIC
  Filled 2020-04-25: qty 5

## 2020-04-25 MED ORDER — INSULIN ASPART 100 UNIT/ML ~~LOC~~ SOLN: 0.0000 [IU] | Freq: Every day | SUBCUTANEOUS | Status: DC

## 2020-04-25 MED ORDER — VANCOMYCIN HCL 1500 MG/300ML IV SOLN
1500.0000 mg | Freq: Once | INTRAVENOUS | Status: AC
  Administered 2020-04-25: 1500 mg via INTRAVENOUS
  Filled 2020-04-25: qty 300

## 2020-04-25 MED ORDER — BRIMONIDINE TARTRATE 0.15 % OP SOLN
1.0000 [drp] | Freq: Two times a day (BID) | OPHTHALMIC | Status: DC
  Administered 2020-04-26 (×2): 1 [drp] via OPHTHALMIC
  Filled 2020-04-25: qty 5

## 2020-04-25 MED ORDER — LOSARTAN POTASSIUM-HCTZ 100-25 MG PO TABS: 1.0000 | ORAL_TABLET | Freq: Every day | ORAL | Status: DC

## 2020-04-25 MED ORDER — LACTATED RINGERS IV SOLN: INTRAVENOUS | Status: DC

## 2020-04-25 MED ORDER — SODIUM CHLORIDE 0.9 % IV BOLUS
1000.0000 mL | Freq: Once | INTRAVENOUS | Status: AC
  Administered 2020-04-25: 1000 mL via INTRAVENOUS

## 2020-04-25 MED ORDER — METRONIDAZOLE IN NACL 5-0.79 MG/ML-% IV SOLN
500.0000 mg | Freq: Three times a day (TID) | INTRAVENOUS | Status: DC
  Administered 2020-04-26 – 2020-04-28 (×7): 500 mg via INTRAVENOUS
  Filled 2020-04-25 (×8): qty 100

## 2020-04-25 MED ORDER — ENOXAPARIN SODIUM 40 MG/0.4ML ~~LOC~~ SOLN
40.0000 mg | SUBCUTANEOUS | Status: DC
  Administered 2020-04-26 – 2020-04-28 (×2): 40 mg via SUBCUTANEOUS
  Filled 2020-04-25 (×3): qty 0.4

## 2020-04-25 MED ORDER — SODIUM CHLORIDE 0.9 % IV SOLN
2.0000 g | Freq: Two times a day (BID) | INTRAVENOUS | Status: DC
  Administered 2020-04-25: 2 g via INTRAVENOUS
  Filled 2020-04-25: qty 2

## 2020-04-25 MED ORDER — ONDANSETRON 4 MG PO TBDP
ORAL_TABLET | ORAL | Status: AC
  Administered 2020-04-25: 4 mg via ORAL
  Filled 2020-04-25: qty 1

## 2020-04-25 MED ORDER — SODIUM CHLORIDE 0.9 % IV SOLN: 2.0000 g | Freq: Once | INTRAVENOUS | Status: DC

## 2020-04-25 MED ORDER — ACETAMINOPHEN 325 MG PO TABS
650.0000 mg | ORAL_TABLET | Freq: Once | ORAL | Status: AC | PRN
  Administered 2020-04-25: 650 mg via ORAL
  Filled 2020-04-25 (×2): qty 2

## 2020-04-25 MED ORDER — INSULIN ASPART 100 UNIT/ML ~~LOC~~ SOLN
0.0000 [IU] | Freq: Three times a day (TID) | SUBCUTANEOUS | Status: DC
  Administered 2020-04-26: 2 [IU] via SUBCUTANEOUS
  Administered 2020-04-26 (×2): 1 [IU] via SUBCUTANEOUS
  Administered 2020-04-27 (×2): 2 [IU] via SUBCUTANEOUS
  Administered 2020-04-27: 1 [IU] via SUBCUTANEOUS
  Filled 2020-04-25 (×5): qty 1

## 2020-04-25 MED ORDER — LEVOTHYROXINE SODIUM 100 MCG PO TABS
100.0000 ug | ORAL_TABLET | Freq: Every day | ORAL | Status: DC
  Administered 2020-04-26 – 2020-04-27 (×2): 100 ug via ORAL
  Filled 2020-04-25 (×2): qty 2

## 2020-04-25 MED ORDER — ENOXAPARIN SODIUM 40 MG/0.4ML ~~LOC~~ SOLN: 40.0000 mg | SUBCUTANEOUS | Status: DC

## 2020-04-25 MED ORDER — METRONIDAZOLE IN NACL 5-0.79 MG/ML-% IV SOLN
500.0000 mg | Freq: Once | INTRAVENOUS | Status: AC
  Administered 2020-04-25: 500 mg via INTRAVENOUS
  Filled 2020-04-25: qty 100

## 2020-04-25 MED ORDER — LACTATED RINGERS IV BOLUS
1000.0000 mL | Freq: Once | INTRAVENOUS | Status: AC
  Administered 2020-04-25: 1000 mL via INTRAVENOUS

## 2020-04-25 MED ORDER — SODIUM CHLORIDE 0.9 % IV BOLUS
1000.0000 mL | Freq: Once | INTRAVENOUS | Status: AC
  Administered 2020-04-26: 1000 mL via INTRAVENOUS

## 2020-04-25 MED ORDER — ACETAMINOPHEN 325 MG PO TABS
650.0000 mg | ORAL_TABLET | Freq: Four times a day (QID) | ORAL | Status: DC | PRN
  Administered 2020-04-26 – 2020-04-27 (×2): 650 mg via ORAL
  Filled 2020-04-25 (×2): qty 2

## 2020-04-25 MED ORDER — LATANOPROST 0.005 % OP SOLN
1.0000 [drp] | Freq: Every day | OPHTHALMIC | Status: DC
  Administered 2020-04-26 – 2020-04-27 (×2): 1 [drp] via OPHTHALMIC
  Filled 2020-04-25: qty 2.5

## 2020-04-25 MED ORDER — SODIUM CHLORIDE 0.9% FLUSH
3.0000 mL | Freq: Once | INTRAVENOUS | Status: AC
Start: 2020-04-25 — End: 2020-04-25
  Administered 2020-04-25: 3 mL via INTRAVENOUS

## 2020-04-25 MED ORDER — ONDANSETRON HCL 4 MG PO TABS: 4.0000 mg | ORAL_TABLET | Freq: Four times a day (QID) | ORAL | Status: DC | PRN

## 2020-04-25 MED ORDER — PILOCARPINE HCL 4 % OP SOLN
2.0000 [drp] | Freq: Four times a day (QID) | OPHTHALMIC | Status: DC
  Administered 2020-04-26 (×4): 2 [drp] via OPHTHALMIC
  Filled 2020-04-25: qty 15

## 2020-04-25 MED ORDER — IOHEXOL 300 MG/ML  SOLN
100.0000 mL | Freq: Once | INTRAMUSCULAR | Status: AC | PRN
  Administered 2020-04-25: 100 mL via INTRAVENOUS

## 2020-04-25 MED ORDER — ONDANSETRON HCL 4 MG/2ML IJ SOLN: 4.0000 mg | Freq: Once | INTRAMUSCULAR | Status: DC | PRN

## 2020-04-25 MED ORDER — ACETAMINOPHEN 650 MG RE SUPP: 650.0000 mg | Freq: Four times a day (QID) | RECTAL | Status: DC | PRN

## 2020-04-25 MED ORDER — ONDANSETRON 4 MG PO TBDP
4.0000 mg | ORAL_TABLET | Freq: Once | ORAL | Status: AC | PRN
  Filled 2020-04-25: qty 1

## 2020-04-25 MED ORDER — PANTOPRAZOLE SODIUM 40 MG PO TBEC
40.0000 mg | DELAYED_RELEASE_TABLET | Freq: Every day | ORAL | Status: DC
  Administered 2020-04-26 – 2020-04-28 (×3): 40 mg via ORAL
  Filled 2020-04-25 (×4): qty 1

## 2020-04-25 NOTE — ED Notes (Signed)
Droop noted to left side of mouth, per daughter speech sounds normal.

## 2020-04-25 NOTE — ED Notes (Signed)
Spoke to Dr. Quentin Cornwall, no code stroke at this time

## 2020-04-25 NOTE — ED Provider Notes (Signed)
[facility] Emergency Department Provider Note    First MD Initiated Contact with Patient 04/25/20 1720     (approximate)  I have reviewed the triage vital signs and the nursing notes.   HISTORY  Chief Complaint Abdominal Pain and Emesis    HPI Heather Small is a 84 y.o. female below listed past medical history presents to the ER for altered mental status nausea vomiting diarrhea epigastric pain.  Presents with altered mental status slurred speech noted since this morning.  Found to be febrile.  Family member with her states has history of the same but they can never figure out exactly why.  No report of any trauma.  Denies any dysuria.  No flank pain.  No previous abdominal surgeries.    Past Medical History:  Diagnosis Date  . Diabetes mellitus without complication ([place])   . Hyperlipemia   . Hypertension   . Hypothyroidism   . Thyroid disease    Family History  Problem Relation Age of Onset  . Diabetes Other   . Heart Problems Mother   . Cancer Father    Past Surgical History:  Procedure Laterality Date  . COLONOSCOPY    . ESOPHAGEAL DILATION    . ESOPHAGOGASTRODUODENOSCOPY (EGD) WITH PROPOFOL N/A 07/13/2018   Procedure: ESOPHAGOGASTRODUODENOSCOPY (EGD) WITH PROPOFOL;  Surgeon: Jonathon Bellows, MD;  Location: [facility] ENDOSCOPY;  Service: Gastroenterology;  Laterality: N/A;  . ESOPHAGOGASTRODUODENOSCOPY (EGD) WITH PROPOFOL N/A 07/30/2018   Procedure: ESOPHAGOGASTRODUODENOSCOPY (EGD) WITH PROPOFOL;  Surgeon: Jonathon Bellows, MD;  Location: [facility] ENDOSCOPY;  Service: Gastroenterology;  Laterality: N/A;  . ESOPHAGOGASTRODUODENOSCOPY (EGD) WITH PROPOFOL N/A 08/18/2018   Procedure: EGD with Dilation;  Surgeon: Jonathon Bellows, MD;  Location: [facility] ENDOSCOPY;  Service: Gastroenterology;  Laterality: N/A;  . ESOPHAGOGASTRODUODENOSCOPY (EGD) WITH PROPOFOL N/A 09/29/2018   Procedure: ESOPHAGOGASTRODUODENOSCOPY (EGD) WITH PROPOFOL;  Surgeon: Jonathon Bellows, MD;  Location: [facility]  ENDOSCOPY;  Service: Gastroenterology;  Laterality: N/A;  . MASTECTOMY Left    Patient Active Problem List   Diagnosis Date Noted  . Diabetes ([place]) 04/25/2020  . Essential hypertension 04/25/2020  . Hyperlipemia 04/25/2020  . Hypothyroidism 04/25/2020  . 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 Stricture esophagus   . Sepsis ([place]) 02/18/2017  . Stroke ([place]) 07/01/2015      Prior to Admission medications   Medication Sig Start Date End Date Taking? Authorizing Provider  brimonidine (ALPHAGAN) 0.15 % ophthalmic solution Place 1 drop into both eyes 2 (two) times daily. 04/11/15  Yes [provider]  dorzolamide-timolol (COSOPT) 22.3-6.8 MG/ML ophthalmic solution 1 drop 2 (two) times daily.   Yes [provider]  levothyroxine (SYNTHROID) 100 MCG tablet Take 100 mcg by mouth daily before breakfast.   Yes [provider]  losartan-hydrochlorothiazide (HYZAAR) 100-25 MG tablet losartan 100 mg-hydrochlorothiazide 25 mg tablet   Yes [provider]  LUMIGAN 0.01 % SOLN Place 1 drop into both eyes at bedtime. 04/11/15  Yes [provider]  metFORMIN (GLUCOPHAGE) 500 MG tablet Take by mouth 2 (two) times daily with a meal.   Yes [provider]  Multiple Vitamin (MULTIVITAMIN) tablet Take 1 tablet by mouth daily.   Yes [provider]  omeprazole (PRILOSEC) 40 MG capsule Take 40 mg by mouth in the morning and at bedtime.   Yes [provider]  pilocarpine (PILOCAR) 4 % ophthalmic solution Place 2 drops into both eyes 4 (four) times daily.   Yes [provider]  simvastatin (ZOCOR) 20 MG tablet Take 1 tablet by mouth  at bedtime. 06/15/15  Yes [provider]  timolol (TIMOPTIC) 0.5 % ophthalmic solution Place 1 drop into both eyes 2 (two) times daily. 04/11/15  Yes [provider]  valsartan-hydrochlorothiazide (DIOVAN-HCT) 320-25 MG tablet Take 1 tablet by mouth daily. 04/13/15 01/23/20  [provider]     Allergies Codeine    Social History Social History   Tobacco Use  . Smoking status: Never Smoker  . Smokeless tobacco: Never Used  Vaping Use  . Vaping Use: Never used  Substance Use Topics  . Alcohol use: No  . Drug use: No    Review of Systems Patient denies headaches, rhinorrhea, blurry vision, numbness, shortness of breath, chest pain, edema, cough, abdominal pain, nausea, vomiting, diarrhea, dysuria, fevers, rashes or hallucinations unless otherwise stated above in HPI. ____________________________________________   PHYSICAL EXAM:  VITAL SIGNS: Vitals:   04/25/20 1930 04/25/20 2130  BP: (!) 169/62 (!) 142/66  Pulse: (!) 126 (!) 126  Resp: (!) 22 19  Temp:    SpO2: 95% 95%    Constitutional: Alert, ill appearing Eyes: Conjunctivae are normal.  Head: Atraumatic. Nose: No congestion/rhinnorhea. Mouth/Throat: Mucous membranes are moist.   Neck: No stridor. Painless ROM.  Cardiovascular: Normal rate, regular rhythm. Grossly normal heart sounds.  Good peripheral circulation. Respiratory: Normal respiratory effort.  No retractions. Lungs CTAB. Gastrointestinal: Soft and nontender. No distention. No abdominal bruits. No CVA tenderness. Genitourinary:  Musculoskeletal: No lower extremity tenderness nor edema.  No joint effusions. Neurologic:  Drowsy but normal speech and language. No gross focal neurologic deficits are appreciated. No facial droop Skin:  Skin is warm, dry and intact. No rash noted. Psychiatric: Mood and affect are normal. Speech and behavior are normal.  ____________________________________________   LABS (all labs ordered are listed, but only abnormal results are displayed)  Results for orders placed or performed during the hospital encounter of 04/25/20 (from the past 24 hour(s))  Glucose, capillary     Status: Abnormal   Collection Time: 04/25/20  4:42 PM  Result Value Ref Range   Glucose-Capillary 157 (H) 70 - 99 mg/dL   Comment 1  Notify RN    Comment 2 Document in Chart   Lipase, blood     Status: Abnormal   Collection Time: 04/25/20  5:23 PM  Result Value Ref Range   Lipase 133 (H) 11 - 51 U/L  Comprehensive metabolic panel     Status: Abnormal   Collection Time: 04/25/20  5:23 PM  Result Value Ref Range   Sodium 134 (L) 135 - 145 mmol/L   Potassium 4.0 3.5 - 5.1 mmol/L   Chloride 99 98 - 111 mmol/L   CO2 24 22 - 32 mmol/L   Glucose, Bld 171 (H) 70 - 99 mg/dL   BUN 14 8 - 23 mg/dL   Creatinine, Ser 0.80 0.44 - 1.00 mg/dL   Calcium 9.4 8.9 - 10.3 mg/dL   Total Protein 8.3 (H) 6.5 - 8.1 g/dL   Albumin 3.9 3.5 - 5.0 g/dL   AST 89 (H) 15 - 41 U/L   ALT 36 0 - 44 U/L   Alkaline Phosphatase 88 38 - 126 U/L   Total Bilirubin 0.9 0.3 - 1.2 mg/dL   GFR calc non Af Amer >60 >60 mL/min   GFR calc Af Amer >60 >60 mL/min   Anion gap 11 5 - 15  CBC     Status: Abnormal   Collection Time: 04/25/20  5:23 PM  Result Value Ref Range  WBC 13.4 (H) 4.0 - 10.5 K/uL   RBC 4.37 3.87 - 5.11 MIL/uL   Hemoglobin 12.0 12.0 - 15.0 g/dL   HCT 36.9 36 - 46 %   MCV 84.4 80.0 - 100.0 fL   MCH 27.5 26.0 - 34.0 pg   MCHC 32.5 30.0 - 36.0 g/dL   RDW 12.5 11.5 - 15.5 %   Platelets 328 150 - 400 K/uL   nRBC 0.0 0.0 - 0.2 %  Protime-INR     Status: None   Collection Time: 04/25/20  5:23 PM  Result Value Ref Range   Prothrombin Time 12.2 11.4 - 15.2 seconds   INR 0.9 0.8 - 1.2  APTT     Status: None   Collection Time: 04/25/20  5:23 PM  Result Value Ref Range   aPTT 24 24 - 36 seconds  Differential     Status: Abnormal   Collection Time: 04/25/20  5:23 PM  Result Value Ref Range   Neutrophils Relative % 91 %   Neutro Abs 12.2 (H) 1.7 - 7.7 K/uL   Lymphocytes Relative 4 %   Lymphs Abs 0.5 (L) 0.7 - 4.0 K/uL   Monocytes Relative 4 %   Monocytes Absolute 0.6 0 - 1 K/uL   Eosinophils Relative 1 %   Eosinophils Absolute 0.1 0 - 0 K/uL   Basophils Relative 0 %   Basophils Absolute 0.0 0 - 0 K/uL   Immature Granulocytes 0 %    Abs Immature Granulocytes 0.06 0.00 - 0.07 K/uL  Lactic acid, plasma     Status: Abnormal   Collection Time: 04/25/20  5:23 PM  Result Value Ref Range   Lactic Acid, Venous 2.3 (HH) 0.5 - 1.9 mmol/L  TSH     Status: None   Collection Time: 04/25/20  5:23 PM  Result Value Ref Range   TSH 0.526 0.350 - 4.500 uIU/mL  SARS Coronavirus 2 by RT PCR (hospital order, performed in [place] hospital lab) Nasopharyngeal Nasopharyngeal Swab     Status: None   Collection Time: 04/25/20  5:55 PM   Specimen: Nasopharyngeal Swab  Result Value Ref Range   SARS Coronavirus 2 NEGATIVE NEGATIVE  Urinalysis, Complete w Microscopic Urine, Clean Catch     Status: Abnormal   Collection Time: 04/25/20  6:26 PM  Result Value Ref Range   Color, Urine YELLOW (A) YELLOW   APPearance CLEAR (A) CLEAR   Specific Gravity, Urine 1.014 1.005 - 1.030   pH 8.0 5.0 - 8.0   Glucose, UA NEGATIVE NEGATIVE mg/dL   Hgb urine dipstick NEGATIVE NEGATIVE   Bilirubin Urine NEGATIVE NEGATIVE   Ketones, ur NEGATIVE NEGATIVE mg/dL   Protein, ur NEGATIVE NEGATIVE mg/dL   Nitrite NEGATIVE NEGATIVE   Leukocytes,Ua NEGATIVE NEGATIVE   RBC / HPF 0-5 0 - 5 RBC/hpf   WBC, UA 0-5 0 - 5 WBC/hpf   Bacteria, UA NONE SEEN NONE SEEN   Squamous Epithelial / LPF 0-5 0 - 5  Lactic acid, plasma     Status: Abnormal   Collection Time: 04/25/20  6:54 PM  Result Value Ref Range   Lactic Acid, Venous 6.7 (HH) 0.5 - 1.9 mmol/L  Glucose, capillary     Status: Abnormal   Collection Time: 04/25/20 10:12 PM  Result Value Ref Range   Glucose-Capillary 141 (H) 70 - 99 mg/dL   ____________________________________________  EKG My review and personal interpretation at Time: 16:39   Indication: sepsis  Rate: 130  Rhythm: sinus Axis: normal Other:  normal intervals, no stemi ____________________________________________  RADIOLOGY  I personally reviewed all radiographic images ordered to evaluate for the above acute complaints and reviewed  radiology reports and findings.  These findings were personally discussed with the patient.  Please see medical record for radiology report.  ____________________________________________   PROCEDURES  Procedure(s) performed:  .Critical Care Performed by: Merlyn Lot, MD Authorized by: Merlyn Lot, MD   Critical care provider statement:    Critical care time (minutes):  40   Critical care time was exclusive of:  Separately billable procedures and treating other patients   Critical care was necessary to treat or prevent imminent or life-threatening deterioration of the following conditions:  Sepsis   Critical care was time spent personally by me on the following activities:  Development of treatment plan with patient or surrogate, discussions with consultants, evaluation of patient's response to treatment, examination of patient, obtaining history from patient or surrogate, ordering and performing treatments and interventions, ordering and review of laboratory studies, ordering and review of radiographic studies, pulse oximetry, re-evaluation of patient's condition and review of old charts      Critical Care performed: yes ____________________________________________   INITIAL IMPRESSION / [place] / ED COURSE  Pertinent labs & imaging results that were available during my care of the patient were reviewed by me and considered in my medical decision making (see chart for details).   DDX: Dehydration, sepsis, pna, uti, hypoglycemia, cva, drug effect, withdrawal, covid 19   Heather Small is a 84 y.o. who presents to the ED with symptoms as described above.  Patient is febrile tachycardic ill-appearing with report of multiple episodes of watery diarrhea.  Possible C. difficile colitis.  She is hypertensive will give IV fluids will order blood work for the above differential. The patient will be placed on continuous pulse oximetry and telemetry for monitoring.   Laboratory evaluation will be sent to evaluate for the above complaints.     Clinical Course as of Apr 26 2335  Tue Apr 25, 2020  1854 Given findings concerning for sepsis we will go ahead and treat with IV antibiotics though unable to identify source at this point.   [PR]  2037 Work-up thus far is unrevealing.  Given her diarrheal illness I suspect some form of enteritis.  If able to send stool samples will do so.  No sign of acute cholecystitis.  She feels improved after IV resuscitation.  Will discuss with hospitalist for admission.   [PR]  2332 Patient has been admitted to hospitalist.  Reassessed.  Lactate was significantly increased from previous but patient states that she feels much improved.  Repeat abdominal exam is soft and benign in all 4 quadrants.  Heart rate still tachycardic but downtrending.  She is still receiving her IV fluid resuscitation.   [PR]    Clinical Course User Index [PR] Merlyn Lot, MD    The patient was evaluated in Emergency Department today for the symptoms described in the history of present illness. He/she was evaluated in the context of the global COVID-19 pandemic, which necessitated consideration that the patient might be at risk for infection with the SARS-CoV-2 virus that causes COVID-19. Institutional protocols and algorithms that pertain to the evaluation of patients at risk for COVID-19 are in a state of rapid change based on information released by regulatory bodies including the CDC and federal and state organizations. These policies and algorithms were followed during the patient's care in the ED.  As part of my medical  decision making, I reviewed the following data within the [place] notes reviewed and incorporated, Labs reviewed, notes from prior ED visits and [place] Controlled Substance Database   ____________________________________________   FINAL CLINICAL IMPRESSION(S) / ED DIAGNOSES  Final diagnoses:   Nausea vomiting and diarrhea  Sepsis without acute organ dysfunction, due to unspecified organism [facility])      NEW MEDICATIONS STARTED DURING THIS VISIT:  New Prescriptions   No medications on file     Note:  This document was prepared using Dragon voice recognition software and may include unintentional dictation errors.    Merlyn Lot, MD 04/25/20 [phone]

## 2020-04-25 NOTE — Progress Notes (Signed)
Pharmacy Antibiotic Note  Heather Small is a 84 y.o. female admitted on 04/25/2020 with AMS. CT head with no evidence of stroke/TIA. Patient diagnosed with sepsis of unknown origin. Pharmacy has been consulted for vancomycin and cefepime dosing.  COVID swab in ED is in process. CXR showing left basilar atelectasis. UA shows no bacteria; neg for nitrite/leukocytes. Urine/blood cultures in process.  WBC somewhat elevated, Tmax 101.2, LA elevated at 2.3.   Plan: Vancomycin 1500mg  IV now  Then start vancomycin 500mg  IV q12h per nomogram Cefepime 2g IV q12h Monitor clinical picture, renal function, vanc levels if needed per duration of therapy F/U C&S, abx de-escalation, LOT  Height: 5\' 3"  (160 cm) Weight: 66.7 kg (147 lb) IBW/kg (Calculated) : 52.4  Temp (24hrs), Avg:101.2 F (38.4 C), Min:101.2 F (38.4 C), Max:101.2 F (38.4 C)  Recent Labs  Lab 04/25/20 1723  WBC 13.4*  CREATININE 0.80  LATICACIDVEN 2.3*    Estimated Creatinine Clearance: 50.6 mL/min (by C-G formula based on SCr of 0.8 mg/dL).    Allergies  Allergen Reactions  . Codeine Anaphylaxis and Other (See Comments)    Couldn't swallow, was rushed to hospital    Antimicrobials this admission: vanc 9/14> Flagyl 9/14> Cefepime 9/14>  Microbiology results: 9/14 BCx in process 9/14 UCx in process

## 2020-04-25 NOTE — ED Triage Notes (Signed)
PT to ED with family with complaints of N/V/D, gastric pain, slurred speech and upon arrival, pt has a fever. Daughter states this his happened before but cannot remember why. PT alert to voice.

## 2020-04-25 NOTE — H&P (Signed)
History and Physical   Heather Small QHU:[id number] DOB: [birth year] DOA: 04/25/2020  Referring MD/NP/PA: Dr. Quentin Small  PCP: Heather Deist, MD   Outpatient Specialists: None  Patient coming from: Home  Chief Complaint: Abdominal pain nausea vomiting  HPI: Heather Small is a 84 y.o. female with medical history significant of diabetes, hypertension, hypothyroidism, hyperlipidemia, who was brought in by family members secondary to altered mental status nausea vomiting and diarrhea.  Patient mainly complains of abdominal pain.  Started this morning.  It started with slurred speech.  She was found to be very febrile.  Lethargic and not being herself.  Patient was brought to the ER.  She did have an episode of diarrhea apparently before coming.  With the moment she has not had any diarrhea.  No recent antibiotic exposure.  Patient meets criteria for sepsis including but limited possible abdominal source.  She has a mass in the cecum which has been there for a while but slightly increased in size.  Not sure what it is.  Colitis suspected at this point as a source for the sepsis.  She is being admitted to the hospital for further evaluation and treatment..  ED Course: Temperature 101.2 blood pressure 177/120 pulse 137 respirate of 32 oxygen sat 95% room air.  Lactic acid is 6.7 white count 13.4 sodium 134 and glucose 171.  Rest of the chemistry and CBC within normal.  Urinalysis essentially negative.  COVID-19 is negative.  CT abdomen pelvis again shows interval increase in the size of the polypoid mass involving the cecum in the region of ileocecal junction.  Also cholelithiasis without evidence of acute cholecystitis.  Patient being admitted with suspected sepsis from abdominal source.  Review of Systems: As per HPI otherwise 10 point review of systems negative.    Past Medical History:  Diagnosis Date  . Diabetes mellitus without complication ([place])   . Hyperlipemia   . Hypertension   .  Hypothyroidism   . Thyroid disease     Past Surgical History:  Procedure Laterality Date  . COLONOSCOPY    . ESOPHAGEAL DILATION    . ESOPHAGOGASTRODUODENOSCOPY (EGD) WITH PROPOFOL N/A 07/13/2018   Procedure: ESOPHAGOGASTRODUODENOSCOPY (EGD) WITH PROPOFOL;  Surgeon: Heather Bellows, MD;  Location: [facility] ENDOSCOPY;  Service: Gastroenterology;  Laterality: N/A;  . ESOPHAGOGASTRODUODENOSCOPY (EGD) WITH PROPOFOL N/A 07/30/2018   Procedure: ESOPHAGOGASTRODUODENOSCOPY (EGD) WITH PROPOFOL;  Surgeon: Heather Bellows, MD;  Location: [facility] ENDOSCOPY;  Service: Gastroenterology;  Laterality: N/A;  . ESOPHAGOGASTRODUODENOSCOPY (EGD) WITH PROPOFOL N/A 08/18/2018   Procedure: EGD with Dilation;  Surgeon: Heather Bellows, MD;  Location[facility] ENDOSCOPY;  Service: Gastroenterology;  Laterality: N/A;  . ESOPHAGOGASTRODUODENOSCOPY (EGD) WITH PROPOFOL N/A 09/29/2018   Procedure: ESOPHAGOGASTRODUODENOSCOPY (EGD) WITH PROPOFOL;  Surgeon: Heather Bellows, MD;  Location: [facility] ENDOSCOPY;  Service: Gastroenterology;  Laterality: N/A;  . MASTECTOMY Left      reports that she has never smoked. She has never used smokeless tobacco. She reports that she does not drink alcohol and does not use drugs.  Allergies  Allergen Reactions  . Codeine Anaphylaxis and Other (See Comments)    Couldn't swallow, was rushed to hospital    Family History  Problem Relation Age of Onset  . Diabetes Other   . Heart Problems Mother   . Cancer Father      Prior to Admission medications   Medication Sig Start Date End Date Taking? Authorizing Provider  brimonidine (ALPHAGAN) 0.15 % ophthalmic solution Place 1 drop into both eyes 2 (two) times daily. 04/11/15  Yes [provider]  dorzolamide-timolol (COSOPT) 22.3-6.8 MG/ML ophthalmic solution 1 drop 2 (two) times daily.   Yes [provider]  levothyroxine (SYNTHROID) 100 MCG tablet Take 100 mcg by mouth daily before breakfast.   Yes [provider]   losartan-hydrochlorothiazide (HYZAAR) 100-25 MG tablet losartan 100 mg-hydrochlorothiazide 25 mg tablet   Yes [provider]  LUMIGAN 0.01 % SOLN Place 1 drop into both eyes at bedtime. 04/11/15  Yes [provider]  metFORMIN (GLUCOPHAGE) 500 MG tablet Take by mouth 2 (two) times daily with a meal.   Yes [provider]  Multiple Vitamin (MULTIVITAMIN) tablet Take 1 tablet by mouth daily.   Yes [provider]  omeprazole (PRILOSEC) 40 MG capsule Take 40 mg by mouth in the morning and at bedtime.   Yes [provider]  pilocarpine (PILOCAR) 4 % ophthalmic solution Place 2 drops into both eyes 4 (four) times daily.   Yes [provider]  simvastatin (ZOCOR) 20 MG tablet Take 1 tablet by mouth at bedtime. 06/15/15  Yes [provider]  timolol (TIMOPTIC) 0.5 % ophthalmic solution Place 1 drop into both eyes 2 (two) times daily. 04/11/15  Yes [provider]  valsartan-hydrochlorothiazide (DIOVAN-HCT) 320-25 MG tablet Take 1 tablet by mouth daily. 04/13/15 01/23/20  [provider]    Physical Exam: Vitals:   04/25/20 1638 04/25/20 1745 04/25/20 1748 04/25/20 1900  BP: (!) 177/120 (!) 164/83  (!) 151/78  Pulse: (!) 131  (!) 137 (!) 131  Resp: 20  (!) 32 (!) 30  Temp: (!) 101.2 F (38.4 C)   99.4 F (37.4 C)  TempSrc: Oral   Oral  SpO2: 97%  95% 96%  Weight:      Height:          Constitutional: Acutely ill looking no significant distress Vitals:   04/25/20 1638 04/25/20 1745 04/25/20 1748 04/25/20 1900  BP: (!) 177/120 (!) 164/83  (!) 151/78  Pulse: (!) 131  (!) 137 (!) 131  Resp: 20  (!) 32 (!) 30  Temp: (!) 101.2 F (38.4 C)   99.4 F (37.4 C)  TempSrc: Oral   Oral  SpO2: 97%  95% 96%  Weight:      Height:       Eyes: PERRL, lids and conjunctivae normal ENMT: Mucous membranes are dry posterior pharynx clear of any exudate or lesions.Normal dentition.  Neck: normal, supple, no masses, no  thyromegaly Respiratory: clear to auscultation bilaterally, no wheezing, no crackles. Normal respiratory effort. No accessory muscle use.  Cardiovascular: Sinus tachycardia, no murmurs / rubs / gallops. No extremity edema. 2+ pedal pulses. No carotid bruits.  Abdomen: Diffuse epigastric to right upper quadrant tenderness no masses palpated. No hepatosplenomegaly. Bowel sounds positive.  Musculoskeletal: no clubbing / cyanosis. No joint deformity upper and lower extremities. Good ROM, no contractures. Normal muscle tone.  Skin: no rashes, lesions, ulcers. No induration Neurologic: CN 2-12 grossly intact. Sensation intact, DTR normal. Strength 5/5 in all 4.  Psychiatric: Normal judgment and insight. Alert and oriented x 3. Normal mood.     Labs on Admission: I have personally reviewed following labs and imaging studies  CBC: Recent Labs  Lab 04/25/20 1723  WBC 13.4*  NEUTROABS 12.2*  HGB 12.0  HCT 36.9  MCV 84.4  PLT 742   Basic Metabolic Panel: Recent Labs  Lab 04/25/20 1723  NA 134*  K 4.0  CL 99  CO2 24  GLUCOSE 171*  BUN 14  CREATININE 0.80  CALCIUM 9.4   GFR: Estimated Creatinine Clearance: 50.6 mL/min (by C-G formula based on SCr of 0.8 mg/dL). Liver Function Tests: Recent Labs  Lab 04/25/20 1723  AST 89*  ALT 36  ALKPHOS 88  BILITOT 0.9  PROT 8.3*  ALBUMIN 3.9   Recent Labs  Lab 04/25/20 1723  LIPASE 133*   No results for input(s): AMMONIA in the last 168 hours. Coagulation Profile: Recent Labs  Lab 04/25/20 1723  INR 0.9   Cardiac Enzymes: No results for input(s): CKTOTAL, CKMB, CKMBINDEX, TROPONINI in the last 168 hours. BNP (last 3 results) No results for input(s): PROBNP in the last 8760 hours. HbA1C: No results for input(s): HGBA1C in the last 72 hours. CBG: Recent Labs  Lab 04/25/20 1642  GLUCAP 157*   Lipid Profile: No results for input(s): CHOL, HDL, LDLCALC, TRIG, CHOLHDL, LDLDIRECT in the last 72 hours. Thyroid Function  Tests: Recent Labs    04/25/20 1723  TSH 0.526   Anemia Panel: No results for input(s): VITAMINB12, FOLATE, FERRITIN, TIBC, IRON, RETICCTPCT in the last 72 hours. Urine analysis:    Component Value Date/Time   COLORURINE YELLOW (A) 04/25/2020 1826   APPEARANCEUR CLEAR (A) 04/25/2020 1826   LABSPEC 1.014 04/25/2020 1826   PHURINE 8.0 04/25/2020 1826   GLUCOSEU NEGATIVE 04/25/2020 1826   HGBUR NEGATIVE 04/25/2020 1826   BILIRUBINUR NEGATIVE 04/25/2020 1826   KETONESUR NEGATIVE 04/25/2020 1826   PROTEINUR NEGATIVE 04/25/2020 1826   NITRITE NEGATIVE 04/25/2020 1826   LEUKOCYTESUR NEGATIVE 04/25/2020 1826   Sepsis Labs: @LABRCNTIP (procalcitonin:4,lacticidven:4) ) Recent Results (from the past 240 hour(s))  SARS Coronavirus 2 by RT PCR (hospital order, performed in [place] hospital lab) Nasopharyngeal Nasopharyngeal Swab     Status: None   Collection Time: 04/25/20  5:55 PM   Specimen: Nasopharyngeal Swab  Result Value Ref Range Status   SARS Coronavirus 2 NEGATIVE NEGATIVE Final    Comment: (NOTE) SARS-CoV-2 target nucleic acids are NOT DETECTED.  The SARS-CoV-2 RNA is generally detectable in upper and lower respiratory specimens during the acute phase of infection. The lowest concentration of SARS-CoV-2 viral copies this assay can detect is 250 copies / mL. A negative result does not preclude SARS-CoV-2 infection and should not be used as the sole basis for treatment or other patient management decisions.  A negative result may occur with improper specimen collection / handling, submission of specimen other than nasopharyngeal swab, presence of viral mutation(s) within the areas targeted by this assay, and inadequate number of viral copies (<250 copies / mL). A negative result must be combined with clinical observations, patient history, and epidemiological information.  Fact Sheet for Patients:   StrictlyIdeas.no  Fact Sheet for Healthcare  Providers: BankingDealers.co.za  This test is not yet approved or  cleared by the [place] FDA and has been authorized for detection and/or diagnosis of SARS-CoV-2 by FDA under an Emergency Use Authorization (EUA).  This EUA will remain in effect (meaning this test can be used) for the duration of the COVID-19 declaration under Section 564(b)(1) of the Act, 21 U.S.C. section 360bbb-3(b)(1), unless the authorization is terminated or revoked sooner.  Performed at [facility], [address] [place],  [address]      Radiological Exams on Admission: CT HEAD WO CONTRAST  Result Date: 04/25/2020 CLINICAL DATA:  Transient ischemic attack (TIA); slurred speech. EXAM: CT HEAD WITHOUT CONTRAST TECHNIQUE: Contiguous axial images were obtained from the base of the skull through the vertex without intravenous  contrast. COMPARISON:  Brain MRI 07/02/2015. head CT 07/01/2015. FINDINGS: Brain: Cerebral volume is normal for age. Stable, mild ill-defined hypoattenuation within the cerebral white matter which is nonspecific, but consistent with chronic small vessel ischemic disease. There is no acute intracranial hemorrhage. No demarcated cortical infarct. No extra-axial fluid collection. No evidence of intracranial mass. No midline shift. Vascular: No hyperdense vessel.  Atherosclerotic calcifications Skull: Normal. Negative for fracture or focal lesion. Sinuses/Orbits: Visualized orbits show no acute finding. Prior right lens replacement. Mild ethmoid sinus mucosal thickening. No significant mastoid effusion. IMPRESSION: No CT evidence of acute intracranial abnormality. Stable, mild cerebral white matter chronic small vessel ischemic disease. Mild ethmoid sinus mucosal thickening. Electronically Signed   By: Kellie Simmering DO   On: 04/25/2020 17:53   CT ABDOMEN PELVIS W CONTRAST  Result Date: 04/25/2020 CLINICAL DATA:  Nausea, vomiting, diarrhea, epigastric abdominal  pain EXAM: CT ABDOMEN AND PELVIS WITH CONTRAST TECHNIQUE: Multidetector CT imaging of the abdomen and pelvis was performed using the standard protocol following bolus administration of intravenous contrast. CONTRAST:  158mL OMNIPAQUE IOHEXOL 300 MG/ML  SOLN COMPARISON:  02/17/2017 FINDINGS: Lower chest: A peripheral nodular opacity is seen within the right lung base posteriorly, not fully characterized on this examination and indeterminate. This region was not fully included on prior examination. Cardiac size within normal limits. No pericardial effusion. Moderate hiatal hernia present. Hepatobiliary: Cholelithiasis is present without pericholecystic inflammatory change identified. Liver unremarkable. No intra or extrahepatic biliary ductal dilation. Pancreas: Unremarkable Spleen: Unremarkable Adrenals/Urinary Tract: The adrenal glands are unremarkable. The kidneys are normal in size and position. There is asymmetric cortical calcification and cortical scarring involving the interpolar region of the right kidney. A stable 7 mm calculus is seen contained within an upper pole probable caliceal diverticulum. At least 1 2 mm nonobstructing renal calculus is identified within the right kidney. No additional renal or ureteral calculi are identified. No hydronephrosis. Multiple simple cortical cysts are seen within the right kidney. The bladder is unremarkable. Stomach/Bowel: There is lobular eccentric wall thickening and a polypoid mass involving the cecum in the region of the ileocecal junction. This has enlarged since prior examination. This is best seen on axial image # 47 and sagittal image # 37. The stomach, small bowel, and large bowel are otherwise unremarkable. Appendix normal. No free intraperitoneal gas or fluid. Vascular/Lymphatic: The abdominal vasculature is age-appropriate with moderate aortoiliac atherosclerotic calcification. No aneurysm. No pathologic adenopathy within the abdomen and pelvis.  Reproductive: Multiple enhancing masses are seen within the uterine corpus in keeping with multiple uterine fibroids. No adnexal masses are seen. Other: The rectum is unremarkable. Tiny fat containing broad-based umbilical hernia is present. Musculoskeletal: Degenerative changes are seen within the lumbar spine. Multiple vertebral hemangiomas are seen within the T8 and T12 vertebral bodies. No suspicious lytic or blastic bone lesions are identified IMPRESSION: 1. Interval increase in size of the endoluminal polypoid mass involving the cecum in the region of the ileocecal junction. Given its slow interval growth since prior examination, this may represent a hyperplastic or hamartomatous polyp, however, this is not well characterized on this examination. Colonoscopy is recommended for further evaluation. 2. Cholelithiasis without evidence of acute cholecystitis. 3. Moderate hiatal hernia. 4. Nonobstructing right nephrolithiasis. No urolithiasis. No hydronephrosis. Aortic Atherosclerosis (ICD10-I70.0). Electronically Signed   By: Fidela Salisbury MD   On: 04/25/2020 20:26   DG Chest Port 1 View  Result Date: 04/25/2020 CLINICAL DATA:  Fever, hypertension EXAM: PORTABLE CHEST 1 VIEW COMPARISON:  11/30/2016 FINDINGS:  Lung volumes are small and there is left basilar atelectasis present. 21 mm nodule within the right lung base is stable since prior examination. No superimposed confluent pulmonary infiltrate. No pneumothorax or pleural effusion. Cardiac size is within normal limits. No acute bone abnormality. IMPRESSION: 1. Small lung volumes with left basilar atelectasis. 2. Stable 21 mm nodule within the right lung base. . Electronically Signed   By: Fidela Salisbury MD   On: 04/25/2020 17:51      Assessment/Plan Principal Problem:   Sepsis ([place]) Active Problems:   Stroke ([place])   Diabetes ([place])   Essential hypertension   Hyperlipemia   Hypothyroidism   Cecum mass     #1 sepsis: Suspected GI source.   Patient having nausea vomiting diarrhea.  We will proceed with stool studies including C. difficile and other pathogens.  In the meantime empirically start patient on treatment for sepsis with cefepime and Flagyl.  Chest x-ray and urine source ruled out.  Monitor closely.  #2 diabetes: Blood sugar appears well controlled at the moment.  Sliding scale insulin with home regimen.  #3 essential hypertension: Confirm and resume home regimen.  Blood pressure elevated at this point.  #4 hyperlipidemia: Confirm on resume home regimen.  #5 mass in the cecum: Although not new since increasing in size.  Patient reported having polyps removed 2 years ago from colonoscopy.  She will need a repeat colonoscopy when this clears up.  #6 hypothyroidism: Continue levothyroxine.  #7 history of CVA: Appears stable at this point.   DVT prophylaxis: Lovenox Code Status: Full code Family Communication: Sister Disposition Plan: Home Consults called: None Admission status: Inpatient  Severity of Illness: The appropriate patient status for this patient is INPATIENT. Inpatient status is judged to be reasonable and necessary in order to provide the required intensity of service to ensure the patient's safety. The patient's presenting symptoms, physical exam findings, and initial radiographic and laboratory data in the context of their chronic comorbidities is felt to place them at high risk for further clinical deterioration. Furthermore, it is not anticipated that the patient will be medically stable for discharge from the hospital within 2 midnights of admission. The following factors support the patient status of inpatient.   " The patient's presenting symptoms include fever nausea vomiting abdominal pain. " The worrisome physical exam findings include epigastric tenderness. " The initial radiographic and laboratory data are worrisome because of evidence of sepsis. " The chronic co-morbidities include diabetes  with hypertension.   * I certify that at the point of admission it is my clinical judgment that the patient will require inpatient hospital care spanning beyond 2 midnights from the point of admission due to high intensity of service, high risk for further deterioration and high frequency of surveillance required.Barbette Merino MD [facility] Pager [phone]  If 7PM-7AM, please contact night-coverage [email or web address] Password [facility]  04/25/2020, 8:54 PM

## 2020-04-26 ENCOUNTER — Inpatient Hospital Stay: Payer: Medicare PPO

## 2020-04-26 DIAGNOSIS — R6521 Severe sepsis with septic shock: Secondary | ICD-10-CM | POA: Diagnosis present

## 2020-04-26 DIAGNOSIS — D649 Anemia, unspecified: Secondary | ICD-10-CM

## 2020-04-26 LAB — BLOOD CULTURE ID PANEL (REFLEXED) - BCID2

## 2020-04-26 LAB — CBC
HCT: 30.2 % — ABNORMAL LOW (ref 36.0–46.0)
Hemoglobin: 9.8 g/dL — ABNORMAL LOW (ref 12.0–15.0)
MCH: 28 pg (ref 26.0–34.0)
MCHC: 32.5 g/dL (ref 30.0–36.0)
MCV: 86.3 fL (ref 80.0–100.0)
Platelets: 254 10*3/uL (ref 150–400)
RBC: 3.5 MIL/uL — ABNORMAL LOW (ref 3.87–5.11)
RDW: 12.9 % (ref 11.5–15.5)
WBC: 11.2 10*3/uL — ABNORMAL HIGH (ref 4.0–10.5)
nRBC: 0 % (ref 0.0–0.2)

## 2020-04-26 LAB — COMPREHENSIVE METABOLIC PANEL
ALT: 57 U/L — ABNORMAL HIGH (ref 0–44)
AST: 97 U/L — ABNORMAL HIGH (ref 15–41)
Albumin: 2.9 g/dL — ABNORMAL LOW (ref 3.5–5.0)
Alkaline Phosphatase: 74 U/L (ref 38–126)
Anion gap: 7 (ref 5–15)
BUN: 10 mg/dL (ref 8–23)
CO2: 28 mmol/L (ref 22–32)
Calcium: 8.3 mg/dL — ABNORMAL LOW (ref 8.9–10.3)
Chloride: 103 mmol/L (ref 98–111)
Creatinine, Ser: 0.7 mg/dL (ref 0.44–1.00)
GFR calc Af Amer: >60 mL/min (ref >60)
GFR calc non Af Amer: >60 mL/min (ref >60)
Glucose, Bld: 139 mg/dL — ABNORMAL HIGH (ref 70–99)
Potassium: 3.1 mmol/L — ABNORMAL LOW (ref 3.5–5.1)
Sodium: 138 mmol/L (ref 135–145)
Total Bilirubin: 0.7 mg/dL (ref 0.3–1.2)
Total Protein: 6.5 g/dL (ref 6.5–8.1)

## 2020-04-26 LAB — HEMOGLOBIN A1C
Hgb A1c MFr Bld: 8.5 % — ABNORMAL HIGH (ref 4.8–5.6)
Mean Plasma Glucose: 197.25 mg/dL

## 2020-04-26 LAB — GLUCOSE, CAPILLARY
Glucose-Capillary: 155 mg/dL — ABNORMAL HIGH (ref 70–99)
Glucose-Capillary: 162 mg/dL — ABNORMAL HIGH (ref 70–99)
Glucose-Capillary: 126 mg/dL — ABNORMAL HIGH (ref 70–99)
Glucose-Capillary: 165 mg/dL — ABNORMAL HIGH (ref 70–99)

## 2020-04-26 LAB — PROTIME-INR
INR: 1.1 (ref 0.8–1.2)
Prothrombin Time: 14.1 seconds (ref 11.4–15.2)

## 2020-04-26 LAB — MAGNESIUM: Magnesium: 1.6 mg/dL — ABNORMAL LOW (ref 1.7–2.4)

## 2020-04-26 LAB — URINE CULTURE

## 2020-04-26 LAB — LIPASE, BLOOD: Lipase: 37 U/L (ref 11–51)

## 2020-04-26 LAB — CORTISOL-AM, BLOOD: Cortisol - AM: 14.1 ug/dL (ref 6.7–22.6)

## 2020-04-26 LAB — PROCALCITONIN: Procalcitonin: 1.1 ng/mL

## 2020-04-26 LAB — LACTIC ACID, PLASMA: Lactic Acid, Venous: 1.4 mmol/L (ref 0.5–1.9)

## 2020-04-26 MED ORDER — LOSARTAN POTASSIUM 50 MG PO TABS
100.0000 mg | ORAL_TABLET | Freq: Every day | ORAL | Status: DC
  Administered 2020-04-26 – 2020-04-28 (×3): 100 mg via ORAL
  Filled 2020-04-26 (×3): qty 2

## 2020-04-26 MED ORDER — HYDROCHLOROTHIAZIDE 25 MG PO TABS
25.0000 mg | ORAL_TABLET | Freq: Every day | ORAL | Status: DC
  Administered 2020-04-26 – 2020-04-28 (×3): 25 mg via ORAL
  Filled 2020-04-26 (×3): qty 1

## 2020-04-26 MED ORDER — POTASSIUM CHLORIDE CRYS ER 20 MEQ PO TBCR
40.0000 meq | EXTENDED_RELEASE_TABLET | Freq: Once | ORAL | Status: AC
  Administered 2020-04-26: 40 meq via ORAL
  Filled 2020-04-26: qty 2

## 2020-04-26 MED ORDER — SODIUM CHLORIDE 0.9 % IV SOLN
2.0000 g | INTRAVENOUS | Status: DC
  Administered 2020-04-26 – 2020-04-28 (×3): 2 g via INTRAVENOUS
  Filled 2020-04-26 (×3): qty 20

## 2020-04-26 MED ORDER — MAGNESIUM SULFATE 2 GM/50ML IV SOLN
2.0000 g | Freq: Once | INTRAVENOUS | Status: AC
  Administered 2020-04-26: 2 g via INTRAVENOUS
  Filled 2020-04-26: qty 50

## 2020-04-26 NOTE — ED Notes (Signed)
Pt provided lunch tray at bedside.

## 2020-04-26 NOTE — Assessment & Plan Note (Addendum)
-   febrile, tachycardic, tachypneic, WBC elevated. Lactic acid initially 6.7  - POA, E.coli severe sepsis 2/2 intra-abdominal infection 2/2 cecal mass - was on vanc/cefepime/flagyl empirically; will de-escalate to Rocephin and flagyl at this time - repeat BC on 9/16, NGTD - obtain stool studies for testing: C. difficile negative, GI pathogen panel negative - lactic normalized with IVF; WBC also improving  -Overall she has clinically improved.  Continue antibiotics and follow-up repeat blood cultures; will likely de-escalate to oral regimen and complete course at discharge  - change to Augmentin at discharge, complete 7 days from neg culture (end date 05/03/20, script sent to her pharmacy)

## 2020-04-26 NOTE — ED Notes (Signed)
Pt A/Ox4. Able to ambulated independently to toilet with a steady gait.

## 2020-04-26 NOTE — Assessment & Plan Note (Signed)
-  Continue losartan-HCTZ

## 2020-04-26 NOTE — Progress Notes (Signed)
PROGRESS NOTE    Heather Small   YWV:[id number]  DOB: 05-10-1936  DOA: 04/25/2020     1  PCP: Heather Deist, MD  CC: fever, weakness, N/V/D  Hospital Course: Ms. Heather Small is an 84 yo AA female with PMH DMII, HLD, HTN, hypothyroidism who presented to the hospital with nausea/vomiting/diarrhea and abdominal pain. She localized the abdominal pain to her lower middle abdomen. She also had developed fever and chills at home as well as weakness. In the ER she was found to be febrile, 101.2, tachycardic, tachypneic. Initial lactic acid was 6.7. WBC 13.4. Urinalysis was negative for signs of infection. COVID-19 negative. She underwent CT abdomen/pelvis which revealed an endoluminal polypoid mass involving the cecum at the ileocecal junction. It was found to be slightly increased in size compared to prior. There were also multiple enhancing masses within the uterine corpus consistent with uterine fibroids. There was also cholelithiasis but no evidence of acute cholecystitis. She also underwent a right upper quadrant ultrasound which also showed cholelithiasis but no acute cholecystitis. Initial lipase was also mildly elevated, 133; this improved with IV fluids on admission down to 37 the following morning. She was started on vancomycin, cefepime, Flagyl for her presumed infection. Stool studies were also ordered given her ongoing diarrhea and abdominal pain. Shortly after admission, blood cultures returned positive with gram-negative rods. Her antibiotics were transitioned to Rocephin and Flagyl.   Interval History:  Seen in the ER this morning awaiting a room. Feeling a little better after receiving fluids overnight. Breakfast tray in front of her and she was about to eat breakfast. Family member also bedside in the ER and updated with questions answered. Still having abdominal pain in her lower middle abdomen.  Old records reviewed in assessment of this patient  ROS: Constitutional: positive for  chills, fatigue, fevers and malaise, Respiratory: negative for cough, Cardiovascular: negative for chest pain and Gastrointestinal: positive for abdominal pain, diarrhea and vomiting  Assessment & Plan: Cecum mass -Endoluminal polypoid mass noted at the ileocecal junction, larger than compared to prior per radiology -Will ultimately need follow-up with GI for probable repeat colonoscopy  Type 2 diabetes mellitus without complication ([place]) - continue SSI and CBG monitoring   Severe sepsis with septic shock (HCC) - febrile, tachycardic, tachypneic, WBC elevated. Lactic acid initially 6.7 due to presumed infection of abdominal source and now is bacteremic with E. Coli in 1/4 bottles on admission - was on vanc/cefepime/flagyl empirically; will de-escalate to Rocephin and flagyl at this time - repeat BC on 9/16 - obtain stool studies for testing  - lactic normalized with IVF; WBC also improving   Essential hypertension -Continue losartan-HCTZ  HLD (hyperlipidemia) -Hold statin for now  Hypothyroidism -TSH 0.526, normal -Continue Synthroid  History of CVA (cerebrovascular accident) - stable and baseline  Normocytic anemia -Hemoglobin has mildly down trended since admission. Possibly dilutional with fluids versus due to severe sepsis or combo -Continue monitoring for now; if further downtrend, will investigate further   Antimicrobials: Vancomycin 04/25/2020>> 04/26/2020 Cefepime 04/25/2020>> 04/26/2020 Flagyl 04/25/2020>> present Rocephin 04/26/2020>> present  DVT prophylaxis: Lovenox Code Status: Full Family Communication: Family bedside Disposition Plan: Status is: Inpatient  Remains inpatient appropriate because:Ongoing diagnostic testing needed not appropriate for outpatient work up, IV treatments appropriate due to intensity of illness or inability to take PO and Inpatient level of care appropriate due to severity of illness   Dispo: The patient is from: Home  Anticipated d/c is to: Home              Anticipated d/c date is: 3 days              Patient currently is not medically stable to d/c.       Objective: Blood pressure 139/71, pulse 87, temperature 98.8 F (37.1 C), temperature source Oral, resp. rate (!) 24, height 5\' 3"  (1.6 m), weight 66.7 kg, SpO2 97 %.  Examination: General appearance: alert, cooperative, fatigued and no distress Head: Normocephalic, without obvious abnormality, atraumatic Eyes: EOMI Lungs: clear to auscultation bilaterally Heart: regular rate and rhythm and S1, S2 normal Abdomen: Soft, nondistended, bowel sounds present. Mild tenderness to palpation in lower middle abdomen with no rebound or guarding Extremities: No edema Skin: mobility and turgor normal Neurologic: Grossly normal  Consultants:   None  Procedures:   None  Data Reviewed: I have personally reviewed following labs and imaging studies Results for orders placed or performed during the hospital encounter of 04/25/20 (from the past 24 hour(s))  Glucose, capillary     Status: Abnormal   Collection Time: 04/25/20  4:42 PM  Result Value Ref Range   Glucose-Capillary 157 (H) 70 - 99 mg/dL   Comment 1 Notify RN    Comment 2 Document in Chart   Lipase, blood     Status: Abnormal   Collection Time: 04/25/20  5:23 PM  Result Value Ref Range   Lipase 133 (H) 11 - 51 U/L  Comprehensive metabolic panel     Status: Abnormal   Collection Time: 04/25/20  5:23 PM  Result Value Ref Range   Sodium 134 (L) 135 - 145 mmol/L   Potassium 4.0 3.5 - 5.1 mmol/L   Chloride 99 98 - 111 mmol/L   CO2 24 22 - 32 mmol/L   Glucose, Bld 171 (H) 70 - 99 mg/dL   BUN 14 8 - 23 mg/dL   Creatinine, Ser 0.80 0.44 - 1.00 mg/dL   Calcium 9.4 8.9 - 10.3 mg/dL   Total Protein 8.3 (H) 6.5 - 8.1 g/dL   Albumin 3.9 3.5 - 5.0 g/dL   AST 89 (H) 15 - 41 U/L   ALT 36 0 - 44 U/L   Alkaline Phosphatase 88 38 - 126 U/L   Total Bilirubin 0.9 0.3 - 1.2 mg/dL   GFR calc non Af  Amer >60 >60 mL/min   GFR calc Af Amer >60 >60 mL/min   Anion gap 11 5 - 15  CBC     Status: Abnormal   Collection Time: 04/25/20  5:23 PM  Result Value Ref Range   WBC 13.4 (H) 4.0 - 10.5 K/uL   RBC 4.37 3.87 - 5.11 MIL/uL   Hemoglobin 12.0 12.0 - 15.0 g/dL   HCT 36.9 36 - 46 %   MCV 84.4 80.0 - 100.0 fL   MCH 27.5 26.0 - 34.0 pg   MCHC 32.5 30.0 - 36.0 g/dL   RDW 12.5 11.5 - 15.5 %   Platelets 328 150 - 400 K/uL   nRBC 0.0 0.0 - 0.2 %  Protime-INR     Status: None   Collection Time: 04/25/20  5:23 PM  Result Value Ref Range   Prothrombin Time 12.2 11.4 - 15.2 seconds   INR 0.9 0.8 - 1.2  APTT     Status: None   Collection Time: 04/25/20  5:23 PM  Result Value Ref Range   aPTT 24 24 - 36 seconds  Differential  Status: Abnormal   Collection Time: 04/25/20  5:23 PM  Result Value Ref Range   Neutrophils Relative % 91 %   Neutro Abs 12.2 (H) 1.7 - 7.7 K/uL   Lymphocytes Relative 4 %   Lymphs Abs 0.5 (L) 0.7 - 4.0 K/uL   Monocytes Relative 4 %   Monocytes Absolute 0.6 0 - 1 K/uL   Eosinophils Relative 1 %   Eosinophils Absolute 0.1 0 - 0 K/uL   Basophils Relative 0 %   Basophils Absolute 0.0 0 - 0 K/uL   Immature Granulocytes 0 %   Abs Immature Granulocytes 0.06 0.00 - 0.07 K/uL  Lactic acid, plasma     Status: Abnormal   Collection Time: 04/25/20  5:23 PM  Result Value Ref Range   Lactic Acid, Venous 2.3 (HH) 0.5 - 1.9 mmol/L  Culture, blood (Routine x 2)     Status: None (Preliminary result)   Collection Time: 04/25/20  5:23 PM   Specimen: BLOOD  Result Value Ref Range   Specimen Description      BLOOD RIGHT ANTECUBITAL Performed at [facility], [place] [address], [place], [place] [address]    Special Requests      BOTTLES DRAWN AEROBIC AND ANAEROBIC Blood Culture adequate volume   Culture  Setup Time      GRAM NEGATIVE RODS ANAEROBIC BOTTLE ONLY Organism ID to follow CRITICAL RESULT CALLED TO, READ BACK BY AND VERIFIED WITH: SCOTT HALL AT 0622 04/26/20  [place] Performed at [facility], [address] [place], [place] [address]    Culture GRAM NEGATIVE RODS    Report Status PENDING   TSH     Status: None   Collection Time: 04/25/20  5:23 PM  Result Value Ref Range   TSH 0.526 0.350 - 4.500 uIU/mL  Hemoglobin A1c     Status: Abnormal   Collection Time: 04/25/20  5:23 PM  Result Value Ref Range   Hgb A1c MFr Bld 8.5 (H) 4.8 - 5.6 %   Mean Plasma Glucose 197.25 mg/dL  Blood Culture ID Panel (Reflexed)     Status: Abnormal   Collection Time: 04/25/20  5:23 PM  Result Value Ref Range   Enterococcus faecalis NOT DETECTED NOT DETECTED   Enterococcus Faecium NOT DETECTED NOT DETECTED   Listeria monocytogenes NOT DETECTED NOT DETECTED   Staphylococcus species NOT DETECTED NOT DETECTED   Staphylococcus aureus (BCID) NOT DETECTED NOT DETECTED   Staphylococcus epidermidis NOT DETECTED NOT DETECTED   Staphylococcus lugdunensis NOT DETECTED NOT DETECTED   Streptococcus species NOT DETECTED NOT DETECTED   Streptococcus agalactiae NOT DETECTED NOT DETECTED   Streptococcus pneumoniae NOT DETECTED NOT DETECTED   Streptococcus pyogenes NOT DETECTED NOT DETECTED   A.calcoaceticus-baumannii NOT DETECTED NOT DETECTED   Bacteroides fragilis NOT DETECTED NOT DETECTED   Enterobacterales DETECTED (A) NOT DETECTED   Enterobacter cloacae complex NOT DETECTED NOT DETECTED   Escherichia coli DETECTED (A) NOT DETECTED   Klebsiella aerogenes NOT DETECTED NOT DETECTED   Klebsiella oxytoca NOT DETECTED NOT DETECTED   Klebsiella pneumoniae NOT DETECTED NOT DETECTED   Proteus species NOT DETECTED NOT DETECTED   Salmonella species NOT DETECTED NOT DETECTED   Serratia marcescens NOT DETECTED NOT DETECTED   Haemophilus influenzae NOT DETECTED NOT DETECTED   Neisseria meningitidis NOT DETECTED NOT DETECTED   Pseudomonas aeruginosa NOT DETECTED NOT DETECTED   Stenotrophomonas maltophilia NOT DETECTED NOT DETECTED   Candida albicans NOT DETECTED NOT  DETECTED   Candida auris NOT DETECTED  NOT DETECTED   Candida glabrata NOT DETECTED NOT DETECTED   Candida krusei NOT DETECTED NOT DETECTED   Candida parapsilosis NOT DETECTED NOT DETECTED   Candida tropicalis NOT DETECTED NOT DETECTED   Cryptococcus neoformans/gattii NOT DETECTED NOT DETECTED   CTX-M ESBL NOT DETECTED NOT DETECTED   Carbapenem resistance IMP NOT DETECTED NOT DETECTED   Carbapenem resistance KPC NOT DETECTED NOT DETECTED   Carbapenem resistance NDM NOT DETECTED NOT DETECTED   Carbapenem resist OXA 48 LIKE NOT DETECTED NOT DETECTED   Carbapenem resistance VIM NOT DETECTED NOT DETECTED  Culture, blood (Routine x 2)     Status: None (Preliminary result)   Collection Time: 04/25/20  5:55 PM   Specimen: BLOOD  Result Value Ref Range   Specimen Description BLOOD BLOOD LEFT FOREARM    Special Requests      BOTTLES DRAWN AEROBIC AND ANAEROBIC Blood Culture adequate volume   Culture  Setup Time      GRAM NEGATIVE RODS AEROBIC BOTTLE ONLY CRITICAL VALUE NOTED.  VALUE IS CONSISTENT WITH PREVIOUSLY REPORTED AND CALLED VALUE. Performed at [facility], [address] [place], [place] [address]    Culture GRAM NEGATIVE RODS    Report Status PENDING   SARS Coronavirus 2 by RT PCR (hospital order, performed in [place] hospital lab) Nasopharyngeal Nasopharyngeal Swab     Status: None   Collection Time: 04/25/20  5:55 PM   Specimen: Nasopharyngeal Swab  Result Value Ref Range   SARS Coronavirus 2 NEGATIVE NEGATIVE  Urinalysis, Complete w Microscopic Urine, Clean Catch     Status: Abnormal   Collection Time: 04/25/20  6:26 PM  Result Value Ref Range   Color, Urine YELLOW (A) YELLOW   APPearance CLEAR (A) CLEAR   Specific Gravity, Urine 1.014 1.005 - 1.030   pH 8.0 5.0 - 8.0   Glucose, UA NEGATIVE NEGATIVE mg/dL   Hgb urine dipstick NEGATIVE NEGATIVE   Bilirubin Urine NEGATIVE NEGATIVE   Ketones, ur NEGATIVE NEGATIVE mg/dL   Protein, ur NEGATIVE NEGATIVE mg/dL    Nitrite NEGATIVE NEGATIVE   Leukocytes,Ua NEGATIVE NEGATIVE   RBC / HPF 0-5 0 - 5 RBC/hpf   WBC, UA 0-5 0 - 5 WBC/hpf   Bacteria, UA NONE SEEN NONE SEEN   Squamous Epithelial / LPF 0-5 0 - 5  Lactic acid, plasma     Status: Abnormal   Collection Time: 04/25/20  6:54 PM  Result Value Ref Range   Lactic Acid, Venous 6.7 (HH) 0.5 - 1.9 mmol/L  Glucose, capillary     Status: Abnormal   Collection Time: 04/25/20 10:12 PM  Result Value Ref Range   Glucose-Capillary 141 (H) 70 - 99 mg/dL  Lactic acid, plasma     Status: None   Collection Time: 04/26/20  3:43 AM  Result Value Ref Range   Lactic Acid, Venous 1.4 0.5 - 1.9 mmol/L  Protime-INR     Status: None   Collection Time: 04/26/20  4:13 AM  Result Value Ref Range   Prothrombin Time 14.1 11.4 - 15.2 seconds   INR 1.1 0.8 - 1.2  Procalcitonin     Status: None   Collection Time: 04/26/20  4:13 AM  Result Value Ref Range   Procalcitonin 1.10 ng/mL  Comprehensive metabolic panel     Status: Abnormal   Collection Time: 04/26/20  4:13 AM  Result Value Ref Range   Sodium 138 135 - 145 mmol/L   Potassium 3.1 (L) 3.5 - 5.1 mmol/L  Chloride 103 98 - 111 mmol/L   CO2 28 22 - 32 mmol/L   Glucose, Bld 139 (H) 70 - 99 mg/dL   BUN 10 8 - 23 mg/dL   Creatinine, Ser 0.70 0.44 - 1.00 mg/dL   Calcium 8.3 (L) 8.9 - 10.3 mg/dL   Total Protein 6.5 6.5 - 8.1 g/dL   Albumin 2.9 (L) 3.5 - 5.0 g/dL   AST 97 (H) 15 - 41 U/L   ALT 57 (H) 0 - 44 U/L   Alkaline Phosphatase 74 38 - 126 U/L   Total Bilirubin 0.7 0.3 - 1.2 mg/dL   GFR calc non Af Amer >60 >60 mL/min   GFR calc Af Amer >60 >60 mL/min   Anion gap 7 5 - 15  CBC     Status: Abnormal   Collection Time: 04/26/20  4:13 AM  Result Value Ref Range   WBC 11.2 (H) 4.0 - 10.5 K/uL   RBC 3.50 (L) 3.87 - 5.11 MIL/uL   Hemoglobin 9.8 (L) 12.0 - 15.0 g/dL   HCT 30.2 (L) 36 - 46 %   MCV 86.3 80.0 - 100.0 fL   MCH 28.0 26.0 - 34.0 pg   MCHC 32.5 30.0 - 36.0 g/dL   RDW 12.9 11.5 - 15.5 %    Platelets 254 150 - 400 K/uL   nRBC 0.0 0.0 - 0.2 %  Magnesium     Status: Abnormal   Collection Time: 04/26/20  4:13 AM  Result Value Ref Range   Magnesium 1.6 (L) 1.7 - 2.4 mg/dL  Lipase, blood     Status: None   Collection Time: 04/26/20  4:13 AM  Result Value Ref Range   Lipase 37 11 - 51 U/L  Cortisol-am, blood     Status: None   Collection Time: 04/26/20  4:14 AM  Result Value Ref Range   Cortisol - AM 14.1 6.7 - 22.6 ug/dL  Glucose, capillary     Status: Abnormal   Collection Time: 04/26/20  8:40 AM  Result Value Ref Range   Glucose-Capillary 155 (H) 70 - 99 mg/dL  Glucose, capillary     Status: Abnormal   Collection Time: 04/26/20  1:32 PM  Result Value Ref Range   Glucose-Capillary 162 (H) 70 - 99 mg/dL    Recent Results (from the past 240 hour(s))  Culture, blood (Routine x 2)     Status: None (Preliminary result)   Collection Time: 04/25/20  5:23 PM   Specimen: BLOOD  Result Value Ref Range Status   Specimen Description   Final    BLOOD RIGHT ANTECUBITAL Performed at [facility], [address] [place], [place] [address]    Special Requests   Final    BOTTLES DRAWN AEROBIC AND ANAEROBIC Blood Culture adequate volume   Culture  Setup Time   Final    GRAM NEGATIVE RODS ANAEROBIC BOTTLE ONLY Organism ID to follow CRITICAL RESULT CALLED TO, READ BACK BY AND VERIFIED WITH: SCOTT HALL AT 0622 04/26/20 SDR Performed at [facility], [address] [place], [place] [address]    Culture GRAM NEGATIVE RODS  Final   Report Status PENDING  Incomplete  Blood Culture ID Panel (Reflexed)     Status: Abnormal   Collection Time: 04/25/20  5:23 PM  Result Value Ref Range Status   Enterococcus faecalis NOT DETECTED NOT DETECTED Final   Enterococcus Faecium NOT DETECTED NOT DETECTED Final   Listeria monocytogenes NOT DETECTED NOT DETECTED Final  Staphylococcus species NOT DETECTED NOT DETECTED Final   Staphylococcus aureus (BCID) NOT DETECTED NOT DETECTED  Final   Staphylococcus epidermidis NOT DETECTED NOT DETECTED Final   Staphylococcus lugdunensis NOT DETECTED NOT DETECTED Final   Streptococcus species NOT DETECTED NOT DETECTED Final   Streptococcus agalactiae NOT DETECTED NOT DETECTED Final   Streptococcus pneumoniae NOT DETECTED NOT DETECTED Final   Streptococcus pyogenes NOT DETECTED NOT DETECTED Final   A.calcoaceticus-baumannii NOT DETECTED NOT DETECTED Final   Bacteroides fragilis NOT DETECTED NOT DETECTED Final   Enterobacterales DETECTED (A) NOT DETECTED Final    Comment: Enterobacterales represent a large order of gram negative bacteria, not a single organism. CRITICAL RESULT CALLED TO, READ BACK BY AND VERIFIED WITH:  SCOTT HALL AT 0622 04/26/21 SDR    Enterobacter cloacae complex NOT DETECTED NOT DETECTED Final   Escherichia coli DETECTED (A) NOT DETECTED Final    Comment: CRITICAL RESULT CALLED TO, READ BACK BY AND VERIFIED WITH:  SCOTT HALL AT 0622 04/26/20 SDR    Klebsiella aerogenes NOT DETECTED NOT DETECTED Final   Klebsiella oxytoca NOT DETECTED NOT DETECTED Final   Klebsiella pneumoniae NOT DETECTED NOT DETECTED Final   Proteus species NOT DETECTED NOT DETECTED Final   Salmonella species NOT DETECTED NOT DETECTED Final   Serratia marcescens NOT DETECTED NOT DETECTED Final   Haemophilus influenzae NOT DETECTED NOT DETECTED Final   Neisseria meningitidis NOT DETECTED NOT DETECTED Final   Pseudomonas aeruginosa NOT DETECTED NOT DETECTED Final   Stenotrophomonas maltophilia NOT DETECTED NOT DETECTED Final   Candida albicans NOT DETECTED NOT DETECTED Final   Candida auris NOT DETECTED NOT DETECTED Final   Candida glabrata NOT DETECTED NOT DETECTED Final   Candida krusei NOT DETECTED NOT DETECTED Final   Candida parapsilosis NOT DETECTED NOT DETECTED Final   Candida tropicalis NOT DETECTED NOT DETECTED Final   Cryptococcus neoformans/gattii NOT DETECTED NOT DETECTED Final   CTX-M ESBL NOT DETECTED NOT DETECTED Final    Carbapenem resistance IMP NOT DETECTED NOT DETECTED Final   Carbapenem resistance KPC NOT DETECTED NOT DETECTED Final   Carbapenem resistance NDM NOT DETECTED NOT DETECTED Final   Carbapenem resist OXA 48 LIKE NOT DETECTED NOT DETECTED Final   Carbapenem resistance VIM NOT DETECTED NOT DETECTED Final    Comment: Performed at [facility], [address] [place], [place] [address]  Culture, blood (Routine x 2)     Status: None (Preliminary result)   Collection Time: 04/25/20  5:55 PM   Specimen: BLOOD  Result Value Ref Range Status   Specimen Description BLOOD BLOOD LEFT FOREARM  Final   Special Requests   Final    BOTTLES DRAWN AEROBIC AND ANAEROBIC Blood Culture adequate volume   Culture  Setup Time   Final    GRAM NEGATIVE RODS AEROBIC BOTTLE ONLY CRITICAL VALUE NOTED.  VALUE IS CONSISTENT WITH PREVIOUSLY REPORTED AND CALLED VALUE. Performed at [facility], [address] [place], [place] [address]    Culture GRAM NEGATIVE RODS  Final   Report Status PENDING  Incomplete  SARS Coronavirus 2 by RT PCR (hospital order, performed in [facility] hospital lab) Nasopharyngeal Nasopharyngeal Swab     Status: None   Collection Time: 04/25/20  5:55 PM   Specimen: Nasopharyngeal Swab  Result Value Ref Range Status   SARS Coronavirus 2 NEGATIVE NEGATIVE Final    Comment: (NOTE) SARS-CoV-2 target nucleic acids are NOT DETECTED.  The SARS-CoV-2 RNA is generally detectable in upper and lower respiratory specimens  during the acute phase of infection. The lowest concentration of SARS-CoV-2 viral copies this assay can detect is 250 copies / mL. A negative result does not preclude SARS-CoV-2 infection and should not be used as the sole basis for treatment or other patient management decisions.  A negative result may occur with improper specimen collection / handling, submission of specimen other than nasopharyngeal swab, presence of viral mutation(s) within the areas  targeted by this assay, and inadequate number of viral copies (<250 copies / mL). A negative result must be combined with clinical observations, patient history, and epidemiological information.  Fact Sheet for Patients:   StrictlyIdeas.no  Fact Sheet for Healthcare Providers: BankingDealers.co.za  This test is not yet approved or  cleared by the [place] FDA and has been authorized for detection and/or diagnosis of SARS-CoV-2 by FDA under an Emergency Use Authorization (EUA).  This EUA will remain in effect (meaning this test can be used) for the duration of the COVID-19 declaration under Section 564(b)(1) of the Act, 21 U.S.C. section 360bbb-3(b)(1), unless the authorization is terminated or revoked sooner.  Performed at [facility], [address], [place], [place] [address]      Radiology Studies: CT HEAD WO CONTRAST  Result Date: 04/25/2020 CLINICAL DATA:  Transient ischemic attack (TIA); slurred speech. EXAM: CT HEAD WITHOUT CONTRAST TECHNIQUE: Contiguous axial images were obtained from the base of the skull through the vertex without intravenous contrast. COMPARISON:  Brain MRI 07/02/2015. head CT 07/01/2015. FINDINGS: Brain: Cerebral volume is normal for age. Stable, mild ill-defined hypoattenuation within the cerebral white matter which is nonspecific, but consistent with chronic small vessel ischemic disease. There is no acute intracranial hemorrhage. No demarcated cortical infarct. No extra-axial fluid collection. No evidence of intracranial mass. No midline shift. Vascular: No hyperdense vessel.  Atherosclerotic calcifications Skull: Normal. Negative for fracture or focal lesion. Sinuses/Orbits: Visualized orbits show no acute finding. Prior right lens replacement. Mild ethmoid sinus mucosal thickening. No significant mastoid effusion. IMPRESSION: No CT evidence of acute intracranial abnormality. Stable, mild cerebral  white matter chronic small vessel ischemic disease. Mild ethmoid sinus mucosal thickening. Electronically Signed   By: Kellie Simmering DO   On: 04/25/2020 17:53   CT ABDOMEN PELVIS W CONTRAST  Result Date: 04/25/2020 CLINICAL DATA:  Nausea, vomiting, diarrhea, epigastric abdominal pain EXAM: CT ABDOMEN AND PELVIS WITH CONTRAST TECHNIQUE: Multidetector CT imaging of the abdomen and pelvis was performed using the standard protocol following bolus administration of intravenous contrast. CONTRAST:  133mL OMNIPAQUE IOHEXOL 300 MG/ML  SOLN COMPARISON:  02/17/2017 FINDINGS: Lower chest: A peripheral nodular opacity is seen within the right lung base posteriorly, not fully characterized on this examination and indeterminate. This region was not fully included on prior examination. Cardiac size within normal limits. No pericardial effusion. Moderate hiatal hernia present. Hepatobiliary: Cholelithiasis is present without pericholecystic inflammatory change identified. Liver unremarkable. No intra or extrahepatic biliary ductal dilation. Pancreas: Unremarkable Spleen: Unremarkable Adrenals/Urinary Tract: The adrenal glands are unremarkable. The kidneys are normal in size and position. There is asymmetric cortical calcification and cortical scarring involving the interpolar region of the right kidney. A stable 7 mm calculus is seen contained within an upper pole probable caliceal diverticulum. At least 1 2 mm nonobstructing renal calculus is identified within the right kidney. No additional renal or ureteral calculi are identified. No hydronephrosis. Multiple simple cortical cysts are seen within the right kidney. The bladder is unremarkable. Stomach/Bowel: There is lobular eccentric wall thickening and a polypoid mass involving  the cecum in the region of the ileocecal junction. This has enlarged since prior examination. This is best seen on axial image # 47 and sagittal image # 37. The stomach, small bowel, and large bowel  are otherwise unremarkable. Appendix normal. No free intraperitoneal gas or fluid. Vascular/Lymphatic: The abdominal vasculature is age-appropriate with moderate aortoiliac atherosclerotic calcification. No aneurysm. No pathologic adenopathy within the abdomen and pelvis. Reproductive: Multiple enhancing masses are seen within the uterine corpus in keeping with multiple uterine fibroids. No adnexal masses are seen. Other: The rectum is unremarkable. Tiny fat containing broad-based umbilical hernia is present. Musculoskeletal: Degenerative changes are seen within the lumbar spine. Multiple vertebral hemangiomas are seen within the T8 and T12 vertebral bodies. No suspicious lytic or blastic bone lesions are identified IMPRESSION: 1. Interval increase in size of the endoluminal polypoid mass involving the cecum in the region of the ileocecal junction. Given its slow interval growth since prior examination, this may represent a hyperplastic or hamartomatous polyp, however, this is not well characterized on this examination. Colonoscopy is recommended for further evaluation. 2. Cholelithiasis without evidence of acute cholecystitis. 3. Moderate hiatal hernia. 4. Nonobstructing right nephrolithiasis. No urolithiasis. No hydronephrosis. Aortic Atherosclerosis (ICD10-I70.0). Electronically Signed   By: Fidela Salisbury MD   On: 04/25/2020 20:26   DG Chest Port 1 View  Result Date: 04/25/2020 CLINICAL DATA:  Fever, hypertension EXAM: PORTABLE CHEST 1 VIEW COMPARISON:  11/30/2016 FINDINGS: Lung volumes are small and there is left basilar atelectasis present. 21 mm nodule within the right lung base is stable since prior examination. No superimposed confluent pulmonary infiltrate. No pneumothorax or pleural effusion. Cardiac size is within normal limits. No acute bone abnormality. IMPRESSION: 1. Small lung volumes with left basilar atelectasis. 2. Stable 21 mm nodule within the right lung base. . Electronically Signed   By:  Fidela Salisbury MD   On: 04/25/2020 17:51   US ABDOMEN LIMITED RUQ  Result Date: 04/26/2020 CLINICAL DATA:  Elevated lipase. EXAM: ULTRASOUND ABDOMEN LIMITED RIGHT UPPER QUADRANT COMPARISON:  None. FINDINGS: Gallbladder: Multiple subcentimeter shadowing echogenic gallstones are seen within the gallbladder lumen (the largest measures approximately 3.9 mm). There is no evidence of gallbladder wall thickening (1.8 mm). No sonographic Murphy sign noted by sonographer. Common bile duct: Diameter: 4.7 mm Liver: No focal lesion identified. Within normal limits in parenchymal echogenicity. Portal vein is patent on color Doppler imaging with normal direction of blood flow towards the liver. Other: None. IMPRESSION: Cholelithiasis without evidence of acute cholecystitis. Electronically Signed   By: Virgina Norfolk M.D.   On: 04/26/2020 01:28   US ABDOMEN LIMITED RUQ  Final Result    CT ABDOMEN PELVIS W CONTRAST  Final Result    DG Chest Port 1 View  Final Result    CT HEAD WO CONTRAST  Final Result      Scheduled Meds: . brimonidine  1 drop Both Eyes BID  . dorzolamide-timolol  1 drop Both Eyes BID  . enoxaparin (LOVENOX) injection  40 mg Subcutaneous Q24H  . losartan  100 mg Oral Daily   And  . hydrochlorothiazide  25 mg Oral Daily  . insulin aspart  0-5 Units Subcutaneous QHS  . insulin aspart  0-9 Units Subcutaneous TID WC  . latanoprost  1 drop Both Eyes QHS  . levothyroxine  100 mcg Oral QAC breakfast  . multivitamin with minerals  1 tablet Oral Daily  . pantoprazole  40 mg Oral Daily  . pilocarpine  2 drop Both Eyes  QID  . timolol  1 drop Both Eyes BID   PRN Meds: acetaminophen **OR** acetaminophen, ondansetron **OR** ondansetron (ZOFRAN) IV Continuous Infusions: . cefTRIAXone (ROCEPHIN)  IV Stopped (04/26/20 0919)  . lactated ringers 125 mL/hr at 04/26/20 1225  . metronidazole 500 mg (04/26/20 1446)      LOS: 1 day  Time spent: Greater than 50% of the 35 minute visit was  spent in counseling/coordination of care for the patient as laid out in the A&P.   Dwyane Dee, MD [facility] 04/26/2020, 3:36 PM  Contact via secure chat.  To contact the attending provider between 7A-7P or the covering provider during after hours 7P-7A, please log into the web site [email or web address] and access using universal [place] password for that web site. If you do not have the password, please call the hospital operator.

## 2020-04-26 NOTE — Assessment & Plan Note (Addendum)
-  Hemoglobin has mildly down trended since admission. Possibly dilutional with fluids versus due to severe sepsis or combo vs ?related to cecal mass (concern would be malignancy) -Continue monitoring for now; if further downtrend, will investigate further -So far remaining stable.  Follow-up biopsy results

## 2020-04-26 NOTE — Hospital Course (Addendum)
Heather Small is an 84 yo AA female with PMH DMII, HLD, HTN, hypothyroidism who presented to the hospital with nausea/vomiting/diarrhea and abdominal pain. She localized the abdominal pain to her lower middle abdomen. She also had developed fever and chills at home as well as weakness. In the ER she was found to be febrile, 101.2, tachycardic, tachypneic. Initial lactic acid was 6.7. WBC 13.4. Urinalysis was negative for signs of infection. COVID-19 negative. She underwent CT abdomen/pelvis which revealed an endoluminal polypoid mass involving the cecum at the ileocecal junction. It was found to be slightly increased in size compared to prior. There were also multiple enhancing masses within the uterine corpus consistent with uterine fibroids. There was also cholelithiasis but no evidence of acute cholecystitis. She also underwent a right upper quadrant ultrasound which also showed cholelithiasis but no acute cholecystitis. Initial lipase was also mildly elevated, 133; this improved with IV fluids on admission down to 37 the following morning. She was started on vancomycin, cefepime, Flagyl for her presumed infection. Stool studies were also ordered given her ongoing diarrhea and abdominal pain.  She was negative for both C. difficile testing and on GI pathogen panel. Shortly after admission, blood cultures returned positive with gram-negative rods. This speciated out to E. Coli. Her antibiotics were transitioned to Rocephin and Flagyl.  GI was consulted regarding her cecal mass.  She underwent colonoscopy on 04/28/2020.  2 sessile polyps were found and removed.  She was also found to have a relatively large fungating mass in the cecum.  This was found to be occupying approximately two thirds of the lumen.  Biopsies were obtained and ink tattoo applied.  General surgery was then consulted.  After evaluation, patient elected for discharging home with close outpatient follow-up to discuss surgery.  She was  continued on a course of Augmentin to continue treating E. coli bacteremia and will complete a 7-day course total of antibiotics from negative cultures.  Course will complete on 05/03/2020.  Patient and family were of course amenable with this plan and she was discharged home in stable condition.

## 2020-04-26 NOTE — Assessment & Plan Note (Signed)
-   stable and baseline

## 2020-04-26 NOTE — Assessment & Plan Note (Signed)
-  Hold statin for now

## 2020-04-26 NOTE — Progress Notes (Signed)
PHARMACY - PHYSICIAN COMMUNICATION CRITICAL VALUE ALERT - BLOOD CULTURE IDENTIFICATION (BCID)  Heather Small is an 84 y.o. female who presented to [facility] on 04/25/2020 with a chief complaint of n/v, diarrhea  Assessment:  Lab reports 1 of 4 bottles + for GNR, E Coli  Name of physician (or Provider) Contacted: Rachael Fee NP  Current antibiotics: Vanco, Cefepime, Flagyl  Changes to prescribed antibiotics recommended: add Rocephin 2gm IV q24hrs and stop Cefepime for now, continue other ABX pending more data Recommendations accepted by provider  Results for orders placed or performed during the hospital encounter of 04/25/20  Blood Culture ID Panel (Reflexed) (Collected: 04/25/2020  5:23 PM)  Result Value Ref Range   Enterococcus faecalis NOT DETECTED NOT DETECTED   Enterococcus Faecium NOT DETECTED NOT DETECTED   Listeria monocytogenes NOT DETECTED NOT DETECTED   Staphylococcus species NOT DETECTED NOT DETECTED   Staphylococcus aureus (BCID) NOT DETECTED NOT DETECTED   Staphylococcus epidermidis NOT DETECTED NOT DETECTED   Staphylococcus lugdunensis NOT DETECTED NOT DETECTED   Streptococcus species NOT DETECTED NOT DETECTED   Streptococcus agalactiae NOT DETECTED NOT DETECTED   Streptococcus pneumoniae NOT DETECTED NOT DETECTED   Streptococcus pyogenes NOT DETECTED NOT DETECTED   A.calcoaceticus-baumannii NOT DETECTED NOT DETECTED   Bacteroides fragilis NOT DETECTED NOT DETECTED   Enterobacterales DETECTED (A) NOT DETECTED   Enterobacter cloacae complex NOT DETECTED NOT DETECTED   Escherichia coli DETECTED (A) NOT DETECTED   Klebsiella aerogenes NOT DETECTED NOT DETECTED   Klebsiella oxytoca NOT DETECTED NOT DETECTED   Klebsiella pneumoniae NOT DETECTED NOT DETECTED   Proteus species NOT DETECTED NOT DETECTED   Salmonella species NOT DETECTED NOT DETECTED   Serratia marcescens NOT DETECTED NOT DETECTED   Haemophilus influenzae NOT DETECTED NOT DETECTED   Neisseria meningitidis  NOT DETECTED NOT DETECTED   Pseudomonas aeruginosa NOT DETECTED NOT DETECTED   Stenotrophomonas maltophilia NOT DETECTED NOT DETECTED   Candida albicans NOT DETECTED NOT DETECTED   Candida auris NOT DETECTED NOT DETECTED   Candida glabrata NOT DETECTED NOT DETECTED   Candida krusei NOT DETECTED NOT DETECTED   Candida parapsilosis NOT DETECTED NOT DETECTED   Candida tropicalis NOT DETECTED NOT DETECTED   Cryptococcus neoformans/gattii NOT DETECTED NOT DETECTED   CTX-M ESBL NOT DETECTED NOT DETECTED   Carbapenem resistance IMP NOT DETECTED NOT DETECTED   Carbapenem resistance KPC NOT DETECTED NOT DETECTED   Carbapenem resistance NDM NOT DETECTED NOT DETECTED   Carbapenem resist OXA 48 LIKE NOT DETECTED NOT DETECTED   Carbapenem resistance VIM NOT DETECTED NOT DETECTED    Hart Robinsons A 04/26/2020  6:35 AM

## 2020-04-26 NOTE — Assessment & Plan Note (Addendum)
-  Endoluminal polypoid mass noted at the ileocecal junction, larger than compared to prior per radiology -Underwent colonoscopy on 04/28/2020 with GI.  Large fungating mass involving approximately 2/3 lumen diameter; biopsies obtained -General surgery evaluated afterwards.  Patient has elected for discharging home with outpatient follow-up to discuss next steps regarding surgery

## 2020-04-26 NOTE — Assessment & Plan Note (Signed)
-  TSH 0.526, normal -Continue Synthroid

## 2020-04-26 NOTE — ED Notes (Signed)
Pt provided with breakfast

## 2020-04-26 NOTE — Assessment & Plan Note (Signed)
-   continue SSI and CBG monitoring  

## 2020-04-26 NOTE — ED Notes (Signed)
Pt's daughter back at bedside. Pt's daughter provided fast food for patient.  Pt provided w/ ice water.

## 2020-04-26 NOTE — ED Notes (Signed)
Pt provided with water per pt's request. Pt's daughter at bedside.

## 2020-04-27 ENCOUNTER — Other Ambulatory Visit: Payer: Self-pay

## 2020-04-27 ENCOUNTER — Encounter: Payer: Self-pay | Admitting: Internal Medicine

## 2020-04-27 DIAGNOSIS — K6389 Other specified diseases of intestine: Secondary | ICD-10-CM

## 2020-04-27 LAB — GASTROINTESTINAL PANEL BY PCR, STOOL (REPLACES STOOL CULTURE)

## 2020-04-27 LAB — COMPREHENSIVE METABOLIC PANEL
ALT: 53 U/L — ABNORMAL HIGH (ref 0–44)
AST: 61 U/L — ABNORMAL HIGH (ref 15–41)
Albumin: 2.7 g/dL — ABNORMAL LOW (ref 3.5–5.0)
Alkaline Phosphatase: 79 U/L (ref 38–126)
Anion gap: 6 (ref 5–15)
BUN: 8 mg/dL (ref 8–23)
CO2: 30 mmol/L (ref 22–32)
Calcium: 8.4 mg/dL — ABNORMAL LOW (ref 8.9–10.3)
Chloride: 102 mmol/L (ref 98–111)
Creatinine, Ser: 0.66 mg/dL (ref 0.44–1.00)
GFR calc Af Amer: >60 mL/min (ref >60)
GFR calc non Af Amer: >60 mL/min (ref >60)
Glucose, Bld: 130 mg/dL — ABNORMAL HIGH (ref 70–99)
Potassium: 3.2 mmol/L — ABNORMAL LOW (ref 3.5–5.1)
Sodium: 138 mmol/L (ref 135–145)
Total Bilirubin: 0.5 mg/dL (ref 0.3–1.2)
Total Protein: 6.2 g/dL — ABNORMAL LOW (ref 6.5–8.1)

## 2020-04-27 LAB — GLUCOSE, CAPILLARY
Glucose-Capillary: 130 mg/dL — ABNORMAL HIGH (ref 70–99)
Glucose-Capillary: 181 mg/dL — ABNORMAL HIGH (ref 70–99)
Glucose-Capillary: 152 mg/dL — ABNORMAL HIGH (ref 70–99)
Glucose-Capillary: 178 mg/dL — ABNORMAL HIGH (ref 70–99)

## 2020-04-27 LAB — CBC WITH DIFFERENTIAL/PLATELET
Abs Immature Granulocytes: 0.05 10*3/uL (ref 0.00–0.07)
Basophils Absolute: 0.1 10*3/uL (ref 0.0–0.1)
Basophils Relative: 1 %
Eosinophils Absolute: 0.3 10*3/uL (ref 0.0–0.5)
Eosinophils Relative: 4 %
HCT: 29.7 % — ABNORMAL LOW (ref 36.0–46.0)
Hemoglobin: 9.5 g/dL — ABNORMAL LOW (ref 12.0–15.0)
Immature Granulocytes: 1 %
Lymphocytes Relative: 17 %
Lymphs Abs: 1.3 10*3/uL (ref 0.7–4.0)
MCH: 27.9 pg (ref 26.0–34.0)
MCHC: 32 g/dL (ref 30.0–36.0)
MCV: 87.1 fL (ref 80.0–100.0)
Monocytes Absolute: 0.7 10*3/uL (ref 0.1–1.0)
Monocytes Relative: 10 %
Neutro Abs: 5.1 10*3/uL (ref 1.7–7.7)
Neutrophils Relative %: 67 %
Platelets: 247 10*3/uL (ref 150–400)
RBC: 3.41 MIL/uL — ABNORMAL LOW (ref 3.87–5.11)
RDW: 12.9 % (ref 11.5–15.5)
WBC: 7.5 10*3/uL (ref 4.0–10.5)
nRBC: 0 % (ref 0.0–0.2)

## 2020-04-27 LAB — C DIFFICILE QUICK SCREEN W PCR REFLEX
C Diff antigen: NEGATIVE
C Diff interpretation: NOT DETECTED
C Diff toxin: NEGATIVE

## 2020-04-27 LAB — MAGNESIUM: Magnesium: 2 mg/dL (ref 1.7–2.4)

## 2020-04-27 MED ORDER — BRIMONIDINE TARTRATE 0.15 % OP SOLN
1.0000 [drp] | Freq: Two times a day (BID) | OPHTHALMIC | Status: DC
  Administered 2020-04-27 – 2020-04-28 (×2): 1 [drp] via OPHTHALMIC
  Filled 2020-04-27: qty 5

## 2020-04-27 MED ORDER — DIPHENHYDRAMINE HCL 50 MG/ML IJ SOLN
12.5000 mg | Freq: Once | INTRAMUSCULAR | Status: DC
  Filled 2020-04-27: qty 1

## 2020-04-27 MED ORDER — PEG 3350-KCL-NA BICARB-NACL 420 G PO SOLR
4000.0000 mL | Freq: Once | ORAL | Status: AC
  Administered 2020-04-27: 4000 mL via ORAL
  Filled 2020-04-27: qty 4000

## 2020-04-27 MED ORDER — PILOCARPINE HCL 4 % OP SOLN
2.0000 [drp] | Freq: Four times a day (QID) | OPHTHALMIC | Status: DC
  Administered 2020-04-27 – 2020-04-28 (×4): 2 [drp] via OPHTHALMIC
  Filled 2020-04-27: qty 15

## 2020-04-27 MED ORDER — TIMOLOL MALEATE 0.5 % OP SOLN
1.0000 [drp] | Freq: Two times a day (BID) | OPHTHALMIC | Status: DC
  Administered 2020-04-27 – 2020-04-28 (×2): 1 [drp] via OPHTHALMIC
  Filled 2020-04-27: qty 5

## 2020-04-27 MED ORDER — DORZOLAMIDE HCL-TIMOLOL MAL 2-0.5 % OP SOLN
1.0000 [drp] | Freq: Two times a day (BID) | OPHTHALMIC | Status: DC
  Administered 2020-04-27 – 2020-04-28 (×2): 1 [drp] via OPHTHALMIC
  Filled 2020-04-27: qty 10

## 2020-04-27 MED ORDER — SODIUM CHLORIDE 0.9 % IV SOLN
INTRAVENOUS | Status: DC
  Administered 2020-04-28: 20 mL/h via INTRAVENOUS

## 2020-04-27 NOTE — Consult Note (Signed)
Jonathon Bellows , MD [address], [place], [place], [place], [address] [id number] [place], [place], [place], [address] Phone: [phone]  Fax: [phone]  Consultation  Referring Provider:     Dr Sabino Gasser Primary Care Physician:  Samara Deist, MD Primary Gastroenterologist:  Dr. Vicente Males         Reason for Consultation:     Cecal mass  Date of Admission:  04/25/2020 Date of Consultation:  04/27/2020         HPI:   Heather Small is a 84 y.o. female presented to the emergency room on 04/25/2020 with abdominal pain nausea and vomiting.  Abdominal pain began this morning.  CT scan in the emergency room demonstrated an endoluminal polypoid mass in the cecum close to the ileocecal junction and colonoscopy has been recommended.  Cholelithiasis also noted without evidence of acute cholecystitis.  Hemoglobin on admission 9.8 g in 2 days back was 12 g in 1 year back was 11.8 g.  MCV is 87.1, creatinine 0.66 albumin 2.7 AST 61 and ALT 53.  Blood cultures have returned positive for gram-negative rods. Stool studies for diarrhea have been ordered but not yet obtained.  The last time we met each other she was having diarrhea which completely resolved after stopping her Metformin.  She states that once the Metformin was restarted the diarrhea recurred.  Denies any rectal bleeding denies any weight loss or other weight gain.  Her last colonoscopy was a few years back and recollect she had a few polyps.  No other complaints presently.  Denies any abdominal pain at this point of time.   Past Medical History:  Diagnosis Date  . Diabetes mellitus without complication ([place])   . Hyperlipemia   . Hypertension   . Hypothyroidism   . Thyroid disease     Past Surgical History:  Procedure Laterality Date  . COLONOSCOPY    . ESOPHAGEAL DILATION    . ESOPHAGOGASTRODUODENOSCOPY (EGD) WITH PROPOFOL N/A 07/13/2018   Procedure: ESOPHAGOGASTRODUODENOSCOPY (EGD) WITH PROPOFOL;  Surgeon: Jonathon Bellows, MD;   Location: [facility] ENDOSCOPY;  Service: Gastroenterology;  Laterality: N/A;  . ESOPHAGOGASTRODUODENOSCOPY (EGD) WITH PROPOFOL N/A 07/30/2018   Procedure: ESOPHAGOGASTRODUODENOSCOPY (EGD) WITH PROPOFOL;  Surgeon: Jonathon Bellows, MD;  Location: [facility] ENDOSCOPY;  Service: Gastroenterology;  Laterality: N/A;  . ESOPHAGOGASTRODUODENOSCOPY (EGD) WITH PROPOFOL N/A 08/18/2018   Procedure: EGD with Dilation;  Surgeon: Jonathon Bellows, MD;  Location: [facility] ENDOSCOPY;  Service: Gastroenterology;  Laterality: N/A;  . ESOPHAGOGASTRODUODENOSCOPY (EGD) WITH PROPOFOL N/A 09/29/2018   Procedure: ESOPHAGOGASTRODUODENOSCOPY (EGD) WITH PROPOFOL;  Surgeon: Jonathon Bellows, MD;  Location: [facility] ENDOSCOPY;  Service: Gastroenterology;  Laterality: N/A;  . MASTECTOMY Left     Prior to Admission medications   Medication Sig Start Date End Date Taking? Authorizing Provider  brimonidine (ALPHAGAN) 0.15 % ophthalmic solution Place 1 drop into both eyes 2 (two) times daily. 04/11/15  Yes [provider]  dorzolamide-timolol (COSOPT) 22.3-6.8 MG/ML ophthalmic solution 1 drop 2 (two) times daily.   Yes [provider]  levothyroxine (SYNTHROID) 100 MCG tablet Take 100 mcg by mouth daily before breakfast.   Yes [provider]  losartan-hydrochlorothiazide (HYZAAR) 100-25 MG tablet losartan 100 mg-hydrochlorothiazide 25 mg tablet   Yes [provider]  LUMIGAN 0.01 % SOLN Place 1 drop into both eyes at bedtime. 04/11/15  Yes [provider]  metFORMIN (GLUCOPHAGE) 500 MG tablet Take by mouth 2 (two) times daily with a meal.   Yes [provider]  Multiple Vitamin (MULTIVITAMIN) tablet  Take 1 tablet by mouth daily.   Yes [provider]  omeprazole (PRILOSEC) 40 MG capsule Take 40 mg by mouth in the morning and at bedtime.   Yes [provider]  pilocarpine (PILOCAR) 4 % ophthalmic solution Place 2 drops into both eyes 4 (four) times daily.   Yes [provider]   simvastatin (ZOCOR) 20 MG tablet Take 1 tablet by mouth at bedtime. 06/15/15  Yes [provider]  timolol (TIMOPTIC) 0.5 % ophthalmic solution Place 1 drop into both eyes 2 (two) times daily. 04/11/15  Yes [provider]  valsartan-hydrochlorothiazide (DIOVAN-HCT) 320-25 MG tablet Take 1 tablet by mouth daily. 04/13/15 01/23/20  [provider]    Family History  Problem Relation Age of Onset  . Diabetes Other   . Heart Problems Mother   . Cancer Father      Social History   Tobacco Use  . Smoking status: Never Smoker  . Smokeless tobacco: Never Used  Vaping Use  . Vaping Use: Never used  Substance Use Topics  . Alcohol use: No  . Drug use: No    Allergies as of 04/25/2020 - Review Complete 04/25/2020  Allergen Reaction Noted  . Codeine Anaphylaxis and Other (See Comments) 07/23/2010    Review of Systems:    All systems reviewed and negative except where noted in HPI.   Physical Exam:  Vital signs in last 24 hours: Pulse Rate:  [78-98] 91 (09/16 1030) Resp:  [17-26] 17 (09/16 1030) BP: (122-167)/(58-92) 164/72 (09/16 1030) SpO2:  [91 %-99 %] 98 % (09/16 1030)   General:   Pleasant, cooperative in NAD Head:  Normocephalic and atraumatic. Eyes:   No icterus.   Conjunctiva pink. PERRLA. Ears:  Normal auditory acuity. Neck:  Supple; no masses or thyroidomegaly Lungs: Respirations even and unlabored. Lungs clear to auscultation bilaterally.   No wheezes, crackles, or rhonchi.  Heart:  Regular rate and rhythm;  Without murmur, clicks, rubs or gallops Abdomen:  Soft, nondistended, nontender. Normal bowel sounds. No appreciable masses or hepatomegaly.  No rebound or guarding.  Neurologic:  Alert and oriented x3;  grossly normal neurologically. Skin:  Intact without significant lesions or rashes. Cervical Nodes:  No significant cervical adenopathy. Psych:  Alert and cooperative. Normal affect.  LAB RESULTS: Recent Labs    04/25/20 1723  04/26/20 0413 04/27/20 0642  WBC 13.4* 11.2* 7.5  HGB 12.0 9.8* 9.5*  HCT 36.9 30.2* 29.7*  PLT 328 254 247   BMET Recent Labs    04/25/20 1723 04/26/20 0413 04/27/20 0642  NA 134* 138 138  K 4.0 3.1* 3.2*  CL 99 103 102  CO2 '24 28 30  ' GLUCOSE 171* 139* 130*  BUN '14 10 8  ' CREATININE 0.80 0.70 0.66  CALCIUM 9.4 8.3* 8.4*   LFT Recent Labs    04/27/20 0642  PROT 6.2*  ALBUMIN 2.7*  AST 61*  ALT 53*  ALKPHOS 79  BILITOT 0.5   PT/INR Recent Labs    04/25/20 1723 04/26/20 0413  LABPROT 12.2 14.1  INR 0.9 1.1    STUDIES: CT HEAD WO CONTRAST  Result Date: 04/25/2020 CLINICAL DATA:  Transient ischemic attack (TIA); slurred speech. EXAM: CT HEAD WITHOUT CONTRAST TECHNIQUE: Contiguous axial images were obtained from the base of the skull through the vertex without intravenous contrast. COMPARISON:  Brain MRI 07/02/2015. head CT 07/01/2015. FINDINGS: Brain: Cerebral volume is normal for age. Stable, mild ill-defined hypoattenuation within the cerebral white matter which is nonspecific, but  consistent with chronic small vessel ischemic disease. There is no acute intracranial hemorrhage. No demarcated cortical infarct. No extra-axial fluid collection. No evidence of intracranial mass. No midline shift. Vascular: No hyperdense vessel.  Atherosclerotic calcifications Skull: Normal. Negative for fracture or focal lesion. Sinuses/Orbits: Visualized orbits show no acute finding. Prior right lens replacement. Mild ethmoid sinus mucosal thickening. No significant mastoid effusion. IMPRESSION: No CT evidence of acute intracranial abnormality. Stable, mild cerebral white matter chronic small vessel ischemic disease. Mild ethmoid sinus mucosal thickening. Electronically Signed   By: Kellie Simmering DO   On: 04/25/2020 17:53   CT ABDOMEN PELVIS W CONTRAST  Result Date: 04/25/2020 CLINICAL DATA:  Nausea, vomiting, diarrhea, epigastric abdominal pain EXAM: CT ABDOMEN AND PELVIS WITH CONTRAST  TECHNIQUE: Multidetector CT imaging of the abdomen and pelvis was performed using the standard protocol following bolus administration of intravenous contrast. CONTRAST:  131m OMNIPAQUE IOHEXOL 300 MG/ML  SOLN COMPARISON:  02/17/2017 FINDINGS: Lower chest: A peripheral nodular opacity is seen within the right lung base posteriorly, not fully characterized on this examination and indeterminate. This region was not fully included on prior examination. Cardiac size within normal limits. No pericardial effusion. Moderate hiatal hernia present. Hepatobiliary: Cholelithiasis is present without pericholecystic inflammatory change identified. Liver unremarkable. No intra or extrahepatic biliary ductal dilation. Pancreas: Unremarkable Spleen: Unremarkable Adrenals/Urinary Tract: The adrenal glands are unremarkable. The kidneys are normal in size and position. There is asymmetric cortical calcification and cortical scarring involving the interpolar region of the right kidney. A stable 7 mm calculus is seen contained within an upper pole probable caliceal diverticulum. At least 1 2 mm nonobstructing renal calculus is identified within the right kidney. No additional renal or ureteral calculi are identified. No hydronephrosis. Multiple simple cortical cysts are seen within the right kidney. The bladder is unremarkable. Stomach/Bowel: There is lobular eccentric wall thickening and a polypoid mass involving the cecum in the region of the ileocecal junction. This has enlarged since prior examination. This is best seen on axial image # 47 and sagittal image # 37. The stomach, small bowel, and large bowel are otherwise unremarkable. Appendix normal. No free intraperitoneal gas or fluid. Vascular/Lymphatic: The abdominal vasculature is age-appropriate with moderate aortoiliac atherosclerotic calcification. No aneurysm. No pathologic adenopathy within the abdomen and pelvis. Reproductive: Multiple enhancing masses are seen within the  uterine corpus in keeping with multiple uterine fibroids. No adnexal masses are seen. Other: The rectum is unremarkable. Tiny fat containing broad-based umbilical hernia is present. Musculoskeletal: Degenerative changes are seen within the lumbar spine. Multiple vertebral hemangiomas are seen within the T8 and T12 vertebral bodies. No suspicious lytic or blastic bone lesions are identified IMPRESSION: 1. Interval increase in size of the endoluminal polypoid mass involving the cecum in the region of the ileocecal junction. Given its slow interval growth since prior examination, this may represent a hyperplastic or hamartomatous polyp, however, this is not well characterized on this examination. Colonoscopy is recommended for further evaluation. 2. Cholelithiasis without evidence of acute cholecystitis. 3. Moderate hiatal hernia. 4. Nonobstructing right nephrolithiasis. No urolithiasis. No hydronephrosis. Aortic Atherosclerosis (ICD10-I70.0). Electronically Signed   By: AFidela SalisburyMD   On: 04/25/2020 20:26   DG Chest Port 1 View  Result Date: 04/25/2020 CLINICAL DATA:  Fever, hypertension EXAM: PORTABLE CHEST 1 VIEW COMPARISON:  11/30/2016 FINDINGS: Lung volumes are small and there is left basilar atelectasis present. 21 mm nodule within the right lung base is stable since prior examination. No superimposed confluent pulmonary infiltrate. No  pneumothorax or pleural effusion. Cardiac size is within normal limits. No acute bone abnormality. IMPRESSION: 1. Small lung volumes with left basilar atelectasis. 2. Stable 21 mm nodule within the right lung base. . Electronically Signed   By: Fidela Salisbury MD   On: 04/25/2020 17:51   US ABDOMEN LIMITED RUQ  Result Date: 04/26/2020 CLINICAL DATA:  Elevated lipase. EXAM: ULTRASOUND ABDOMEN LIMITED RIGHT UPPER QUADRANT COMPARISON:  None. FINDINGS: Gallbladder: Multiple subcentimeter shadowing echogenic gallstones are seen within the gallbladder lumen (the largest  measures approximately 3.9 mm). There is no evidence of gallbladder wall thickening (1.8 mm). No sonographic Murphy sign noted by sonographer. Common bile duct: Diameter: 4.7 mm Liver: No focal lesion identified. Within normal limits in parenchymal echogenicity. Portal vein is patent on color Doppler imaging with normal direction of blood flow towards the liver. Other: None. IMPRESSION: Cholelithiasis without evidence of acute cholecystitis. Electronically Signed   By: Virgina Norfolk M.D.   On: 04/26/2020 01:28      Impression / Plan:   Heather Small is a 84 y.o. y/o female who was admitted with nausea vomiting and abdominal pain which have since resolved.  1 out of 4 bottles of blood culture have grown E. coli.  CT scan of the abdomen showed a possible cecal mass.  Plan 1.  Diagnostic colonoscopy tomorrow   I have discussed alternative options, risks & benefits,  which include, but are not limited to, bleeding, infection, perforation,respiratory complication & drug reaction.  The patient agrees with this plan & written consent will be obtained.     Thank you for involving me in the care of this patient.      LOS: 2 days   Jonathon Bellows, MD  04/27/2020, 11:07 AM

## 2020-04-27 NOTE — Evaluation (Signed)
Physical Therapy Evaluation Patient Details Name: Heather Small MRN: [id number] DOB: [birth year] Today's Date: 04/27/2020   History of Present Illness  Pt is an 84 y.o. female presenting to hospital 9/14 with AMS, N/V/D, and epigastric pain; also slurred speech since morning.  Pt admitted with sepsis, DM, htn, and noted with mass in cecum.  PMH includes DM and htn.  Clinical Impression  Prior to hospital admission, pt was independent with ambulation; lives with her daughter.  Currently pt is modified independent with bed mobility; independent with transfers; and CGA to SBA with ambulation 120 feet (no AD use).  Pt initially with a couple altered stepping patterns to side/mild loss of balance during ambulation (CGA provided for safety) but pt able to self correct and maintain balance on her own; improved balance noted with continued walking and pt then appearing steady and safe with ambulation (including turns while walking).  Limited distance ambulating though d/t fatigue.  HR 89 bpm at rest and increased up to 112 bpm with activity.  Pt would benefit from skilled PT to address noted impairments and functional limitations (see below for any additional details).  Upon hospital discharge, anticipate no further PT needs.    Follow Up Recommendations No PT follow up    Equipment Recommendations  None recommended by PT    Recommendations for Other Services       Precautions / Restrictions Precautions Precautions: None Restrictions Weight Bearing Restrictions: No      Mobility  Bed Mobility Overal bed mobility: Modified Independent             General bed mobility comments: Semi-supine to/from sitting without any noted difficulty  Transfers Overall transfer level: Independent Equipment used: None             General transfer comment: fairly strong stand from bed; steady; controlled descent sitting onto bed  Ambulation/Gait Ambulation/Gait assistance: Min  guard;Supervision Gait Distance (Feet): 120 Feet Assistive device: None Gait Pattern/deviations: Step-through pattern Gait velocity: mildy decreased   General Gait Details: pt initially with a couple altered stepping patterns to side/mild loss of balance (CGA provided for safety) but pt able to self correct and maintain balance; improved balance noted with continued walking  Stairs            Wheelchair Mobility    Modified Rankin (Stroke Patients Only)       Balance Overall balance assessment: Needs assistance Sitting-balance support: No upper extremity supported;Feet supported Sitting balance-Leahy Scale: Normal Sitting balance - Comments: steady sitting reaching outside BOS   Standing balance support: No upper extremity supported Standing balance-Leahy Scale: Good Standing balance comment: a couple altered stepping patterns to side/mild loss of balance with ambulation initially but pt able to self correct and improved balance noted with continued ambulation                             Pertinent Vitals/Pain Pain Assessment: No/denies pain  O2 sats WFL during sessions activities (on room air).    Home Living Family/patient expects to be discharged to:: Private residence Living Arrangements: Children (Pt's daughter) Available Help at Discharge: Family Type of Home: House Home Access: Stairs to enter Entrance Stairs-Rails: None Entrance Stairs-Number of Steps: 1 Home Layout: Two level;Bed/bath upstairs Home Equipment: None      Prior Function Level of Independence: Independent         Comments: Pt reports no falls in past 6 months.  Hand Dominance        Extremity/Trunk Assessment   Upper Extremity Assessment Upper Extremity Assessment: Generalized weakness    Lower Extremity Assessment Lower Extremity Assessment: Generalized weakness    Cervical / Trunk Assessment Cervical / Trunk Assessment: Normal  Communication    Communication: No difficulties  Cognition Arousal/Alertness: Awake/alert Behavior During Therapy: WFL for tasks assessed/performed Overall Cognitive Status: Within Functional Limits for tasks assessed                                        General Comments   Nursing cleared pt for participation in physical therapy.  Pt agreeable to PT session.  Pt's daughter present during session.    Exercises     Assessment/Plan    PT Assessment Patient needs continued PT services  PT Problem List Decreased strength;Decreased activity tolerance;Decreased balance;Decreased mobility;Decreased knowledge of use of DME       PT Treatment Interventions DME instruction;Gait training;Stair training;Functional mobility training;Therapeutic activities;Therapeutic exercise;Balance training;Patient/family education    PT Goals (Current goals can be found in the Care Plan section)  Acute Rehab PT Goals Patient Stated Goal: to go home PT Goal Formulation: With patient Time For Goal Achievement: 05/11/20 Potential to Achieve Goals: Good    Frequency Min 2X/week   Barriers to discharge        Co-evaluation               AM-PAC PT "6 Clicks" Mobility  Outcome Measure Help needed turning from your back to your side while in a flat bed without using bedrails?: None Help needed moving from lying on your back to sitting on the side of a flat bed without using bedrails?: None Help needed moving to and from a bed to a chair (including a wheelchair)?: None Help needed standing up from a chair using your arms (e.g., wheelchair or bedside chair)?: None Help needed to walk in hospital room?: A Little Help needed climbing 3-5 steps with a railing? : A Little 6 Click Score: 22    End of Session Equipment Utilized During Treatment: Gait belt Activity Tolerance: Patient tolerated treatment well Patient left: in bed Nurse Communication: Mobility status;Precautions PT Visit Diagnosis:  Unsteadiness on feet (R26.81);Muscle weakness (generalized) (M62.81)    Time: 2725-3664 PT Time Calculation (min) (ACUTE ONLY): 22 min   Charges:   PT Evaluation $PT Eval Low Complexity: 1 Low PT Treatments $Therapeutic Activity: 8-22 mins        Leitha Bleak, PT 04/27/20, 3:40 PM

## 2020-04-27 NOTE — ED Notes (Signed)
Pt resting with even unlabored respirations. Family member at bedside

## 2020-04-27 NOTE — Progress Notes (Addendum)
PROGRESS NOTE    Heather Small   IPJ:[id number]  DOB: February 21, 1936  DOA: 04/25/2020     2  PCP: Samara Deist, MD  CC: fever, weakness, N/V/D  Hospital Course: Heather Small is an 84 yo AA female with PMH DMII, HLD, HTN, hypothyroidism who presented to the hospital with nausea/vomiting/diarrhea and abdominal pain. She localized the abdominal pain to her lower middle abdomen. She also had developed fever and chills at home as well as weakness. In the ER she was found to be febrile, 101.2, tachycardic, tachypneic. Initial lactic acid was 6.7. WBC 13.4. Urinalysis was negative for signs of infection. COVID-19 negative. She underwent CT abdomen/pelvis which revealed an endoluminal polypoid mass involving the cecum at the ileocecal junction. It was found to be slightly increased in size compared to prior. There were also multiple enhancing masses within the uterine corpus consistent with uterine fibroids. There was also cholelithiasis but no evidence of acute cholecystitis. She also underwent a right upper quadrant ultrasound which also showed cholelithiasis but no acute cholecystitis. Initial lipase was also mildly elevated, 133; this improved with IV fluids on admission down to 37 the following morning. She was started on vancomycin, cefepime, Flagyl for her presumed infection. Stool studies were also ordered given her ongoing diarrhea and abdominal pain.  She was negative for both C. difficile testing and on GI pathogen panel. Shortly after admission, blood cultures returned positive with gram-negative rods. This speciated out to E. Coli. Her antibiotics were transitioned to Rocephin and Flagyl.  GI was consulted regarding her cecal mass and she is scheduled for a colonoscopy on 04/28/2020.   Interval History:  No events overnight.  Family bedside this morning in the ER while she is still waiting on the room.  Abdominal pain seems to have improved.  She has had no further diarrhea and no stool  testing has been able to be obtained for testing either.  Questions answered and overall status updated.  Old records reviewed in assessment of this patient  ROS: Constitutional: positive for chills, fatigue, fevers and malaise, Respiratory: negative for cough, Cardiovascular: negative for chest pain and Gastrointestinal: positive for abdominal pain, diarrhea and vomiting  Assessment & Plan: Cecum mass -Endoluminal polypoid mass noted at the ileocecal junction, larger than compared to prior per radiology -Will ultimately need follow-up with GI for probable repeat colonoscopy -Given clinical improvement, GI consulted while patient hospitalized.  She will undergo diagnostic colonoscopy on 04/28/2020, appreciate assistance  Type 2 diabetes mellitus without complication ([place]) - continue SSI and CBG monitoring   Severe sepsis with septic shock (HCC) - febrile, tachycardic, tachypneic, WBC elevated. Lactic acid initially 6.7 due to presumed infection of abdominal source and now is bacteremic with E. Coli in 1/4 bottles on admission - was on vanc/cefepime/flagyl empirically; will de-escalate to Rocephin and flagyl at this time - repeat BC on 9/16 - obtain stool studies for testing: C. difficile negative, GI pathogen panel negative - lactic normalized with IVF; WBC also improving  -Overall she has clinically improved.  Continue antibiotics and follow-up repeat blood cultures; will likely de-escalate to oral regimen and complete course at discharge   Essential hypertension -Continue losartan-HCTZ  HLD (hyperlipidemia) -Hold statin for now  Hypothyroidism -TSH 0.526, normal -Continue Synthroid  History of CVA (cerebrovascular accident) - stable and baseline  Normocytic anemia -Hemoglobin has mildly down trended since admission. Possibly dilutional with fluids versus due to severe sepsis or combo vs ?related to cecal mass (concern would be malignancy) -Continue monitoring for  now; if  further downtrend, will investigate further -So far remaining stable.  Follow-up biopsy results   Antimicrobials: Vancomycin 04/25/2020>> 04/26/2020 Cefepime 04/25/2020>> 04/26/2020 Flagyl 04/25/2020>> present Rocephin 04/26/2020>> present  DVT prophylaxis: Lovenox Code Status: Full Family Communication: Family bedside Disposition Plan: Status is: Inpatient  Remains inpatient appropriate because:Ongoing diagnostic testing needed not appropriate for outpatient work up, IV treatments appropriate due to intensity of illness or inability to take PO and Inpatient level of care appropriate due to severity of illness   Dispo: The patient is from: Home              Anticipated d/c is to: Home              Anticipated d/c date is: 2 days              Patient currently is not medically stable to d/c.  Objective: Blood pressure (!) 183/85, pulse 94, temperature 98.5 F (36.9 C), temperature source Oral, resp. rate (!) 23, height 5\' 3"  (1.6 m), weight 66.7 kg, SpO2 95 %.  Examination: General appearance: alert, cooperative, fatigued and no distress Head: Normocephalic, without obvious abnormality, atraumatic Eyes: EOMI Lungs: clear to auscultation bilaterally Heart: regular rate and rhythm and S1, S2 normal Abdomen: Soft, nondistended, bowel sounds present. Mild tenderness to palpation in lower middle abdomen with no rebound or guarding Extremities: No edema Skin: mobility and turgor normal Neurologic: Grossly normal  Consultants:   GI  Procedures:   Colonoscopy, 04/28/2020  Data Reviewed: I have personally reviewed following labs and imaging studies Results for orders placed or performed during the hospital encounter of 04/25/20 (from the past 24 hour(s))  Glucose, capillary     Status: Abnormal   Collection Time: 04/26/20 10:44 PM  Result Value Ref Range   Glucose-Capillary 165 (H) 70 - 99 mg/dL  CBC with Differential/Platelet     Status: Abnormal   Collection Time: 04/27/20  6:42  AM  Result Value Ref Range   WBC 7.5 4.0 - 10.5 K/uL   RBC 3.41 (L) 3.87 - 5.11 MIL/uL   Hemoglobin 9.5 (L) 12.0 - 15.0 g/dL   HCT 29.7 (L) 36 - 46 %   MCV 87.1 80.0 - 100.0 fL   MCH 27.9 26.0 - 34.0 pg   MCHC 32.0 30.0 - 36.0 g/dL   RDW 12.9 11.5 - 15.5 %   Platelets 247 150 - 400 K/uL   nRBC 0.0 0.0 - 0.2 %   Neutrophils Relative % 67 %   Neutro Abs 5.1 1.7 - 7.7 K/uL   Lymphocytes Relative 17 %   Lymphs Abs 1.3 0.7 - 4.0 K/uL   Monocytes Relative 10 %   Monocytes Absolute 0.7 0 - 1 K/uL   Eosinophils Relative 4 %   Eosinophils Absolute 0.3 0 - 0 K/uL   Basophils Relative 1 %   Basophils Absolute 0.1 0 - 0 K/uL   Immature Granulocytes 1 %   Abs Immature Granulocytes 0.05 0.00 - 0.07 K/uL  Comprehensive metabolic panel     Status: Abnormal   Collection Time: 04/27/20  6:42 AM  Result Value Ref Range   Sodium 138 135 - 145 mmol/L   Potassium 3.2 (L) 3.5 - 5.1 mmol/L   Chloride 102 98 - 111 mmol/L   CO2 30 22 - 32 mmol/L   Glucose, Bld 130 (H) 70 - 99 mg/dL   BUN 8 8 - 23 mg/dL   Creatinine, Ser 0.66 0.44 - 1.00 mg/dL   Calcium 8.4 (  L) 8.9 - 10.3 mg/dL   Total Protein 6.2 (L) 6.5 - 8.1 g/dL   Albumin 2.7 (L) 3.5 - 5.0 g/dL   AST 61 (H) 15 - 41 U/L   ALT 53 (H) 0 - 44 U/L   Alkaline Phosphatase 79 38 - 126 U/L   Total Bilirubin 0.5 0.3 - 1.2 mg/dL   GFR calc non Af Amer >60 >60 mL/min   GFR calc Af Amer >60 >60 mL/min   Anion gap 6 5 - 15  Magnesium     Status: None   Collection Time: 04/27/20  6:42 AM  Result Value Ref Range   Magnesium 2.0 1.7 - 2.4 mg/dL  Glucose, capillary     Status: Abnormal   Collection Time: 04/27/20  8:26 AM  Result Value Ref Range   Glucose-Capillary 130 (H) 70 - 99 mg/dL  Glucose, capillary     Status: Abnormal   Collection Time: 04/27/20 11:18 AM  Result Value Ref Range   Glucose-Capillary 181 (H) 70 - 99 mg/dL  C Difficile Quick Screen w PCR reflex     Status: None   Collection Time: 04/27/20 11:33 AM   Specimen: STOOL  Result  Value Ref Range   C Diff antigen NEGATIVE NEGATIVE   C Diff toxin NEGATIVE NEGATIVE   C Diff interpretation No C. difficile detected.   Gastrointestinal Panel by PCR , Stool     Status: None   Collection Time: 04/27/20 11:33 AM   Specimen: STOOL  Result Value Ref Range   Campylobacter species NOT DETECTED NOT DETECTED   Plesimonas shigelloides NOT DETECTED NOT DETECTED   Salmonella species NOT DETECTED NOT DETECTED   Yersinia enterocolitica NOT DETECTED NOT DETECTED   Vibrio species NOT DETECTED NOT DETECTED   Vibrio cholerae NOT DETECTED NOT DETECTED   Enteroaggregative E coli (EAEC) NOT DETECTED NOT DETECTED   Enteropathogenic E coli (EPEC) NOT DETECTED NOT DETECTED   Enterotoxigenic E coli (ETEC) NOT DETECTED NOT DETECTED   Shiga like toxin producing E coli (STEC) NOT DETECTED NOT DETECTED   Shigella/Enteroinvasive E coli (EIEC) NOT DETECTED NOT DETECTED   Cryptosporidium NOT DETECTED NOT DETECTED   Cyclospora cayetanensis NOT DETECTED NOT DETECTED   Entamoeba histolytica NOT DETECTED NOT DETECTED   Giardia lamblia NOT DETECTED NOT DETECTED   Adenovirus F40/41 NOT DETECTED NOT DETECTED   Astrovirus NOT DETECTED NOT DETECTED   Norovirus GI/GII NOT DETECTED NOT DETECTED   Rotavirus A NOT DETECTED NOT DETECTED   Sapovirus (I, II, IV, and V) NOT DETECTED NOT DETECTED  Glucose, capillary     Status: Abnormal   Collection Time: 04/27/20  4:23 PM  Result Value Ref Range   Glucose-Capillary 152 (H) 70 - 99 mg/dL    Recent Results (from the past 240 hour(s))  Culture, blood (Routine x 2)     Status: Abnormal (Preliminary result)   Collection Time: 04/25/20  5:23 PM   Specimen: BLOOD  Result Value Ref Range Status   Specimen Description   Final    BLOOD RIGHT ANTECUBITAL Performed at [facility], [address], [place], [place] [address]    Special Requests   Final    BOTTLES DRAWN AEROBIC AND ANAEROBIC Blood Culture adequate volume Performed at [address] [place], [place] [address]    Culture  Setup Time   Final    GRAM NEGATIVE RODS ANAEROBIC BOTTLE ONLY Organism ID to follow CRITICAL RESULT CALLED TO, READ BACK BY AND VERIFIED  WITH: Hart Robinsons AT 7341 04/26/20 SDR Performed at [facility], [address], [place], [place] [address]    Culture (A)  Final    ESCHERICHIA COLI SUSCEPTIBILITIES TO FOLLOW Performed at [facility], [place] [address], [place], [place] [address]    Report Status PENDING  Incomplete  Blood Culture ID Panel (Reflexed)     Status: Abnormal   Collection Time: 04/25/20  5:23 PM  Result Value Ref Range Status   Enterococcus faecalis NOT DETECTED NOT DETECTED Final   Enterococcus Faecium NOT DETECTED NOT DETECTED Final   Listeria monocytogenes NOT DETECTED NOT DETECTED Final   Staphylococcus species NOT DETECTED NOT DETECTED Final   Staphylococcus aureus (BCID) NOT DETECTED NOT DETECTED Final   Staphylococcus epidermidis NOT DETECTED NOT DETECTED Final   Staphylococcus lugdunensis NOT DETECTED NOT DETECTED Final   Streptococcus species NOT DETECTED NOT DETECTED Final   Streptococcus agalactiae NOT DETECTED NOT DETECTED Final   Streptococcus pneumoniae NOT DETECTED NOT DETECTED Final   Streptococcus pyogenes NOT DETECTED NOT DETECTED Final   A.calcoaceticus-baumannii NOT DETECTED NOT DETECTED Final   Bacteroides fragilis NOT DETECTED NOT DETECTED Final   Enterobacterales DETECTED (A) NOT DETECTED Final    Comment: Enterobacterales represent a large order of gram negative bacteria, not a single organism. CRITICAL RESULT CALLED TO, READ BACK BY AND VERIFIED WITH:  SCOTT HALL AT 0622 04/26/21 SDR    Enterobacter cloacae complex NOT DETECTED NOT DETECTED Final   Escherichia coli DETECTED (A) NOT DETECTED Final    Comment: CRITICAL RESULT CALLED TO, READ BACK BY AND VERIFIED WITH:  SCOTT HALL AT 0622 04/26/20 SDR    Klebsiella aerogenes NOT DETECTED NOT DETECTED Final   Klebsiella  oxytoca NOT DETECTED NOT DETECTED Final   Klebsiella pneumoniae NOT DETECTED NOT DETECTED Final   Proteus species NOT DETECTED NOT DETECTED Final   Salmonella species NOT DETECTED NOT DETECTED Final   Serratia marcescens NOT DETECTED NOT DETECTED Final   Haemophilus influenzae NOT DETECTED NOT DETECTED Final   Neisseria meningitidis NOT DETECTED NOT DETECTED Final   Pseudomonas aeruginosa NOT DETECTED NOT DETECTED Final   Stenotrophomonas maltophilia NOT DETECTED NOT DETECTED Final   Candida albicans NOT DETECTED NOT DETECTED Final   Candida auris NOT DETECTED NOT DETECTED Final   Candida glabrata NOT DETECTED NOT DETECTED Final   Candida krusei NOT DETECTED NOT DETECTED Final   Candida parapsilosis NOT DETECTED NOT DETECTED Final   Candida tropicalis NOT DETECTED NOT DETECTED Final   Cryptococcus neoformans/gattii NOT DETECTED NOT DETECTED Final   CTX-M ESBL NOT DETECTED NOT DETECTED Final   Carbapenem resistance IMP NOT DETECTED NOT DETECTED Final   Carbapenem resistance KPC NOT DETECTED NOT DETECTED Final   Carbapenem resistance NDM NOT DETECTED NOT DETECTED Final   Carbapenem resist OXA 48 LIKE NOT DETECTED NOT DETECTED Final   Carbapenem resistance VIM NOT DETECTED NOT DETECTED Final    Comment: Performed at [facility], [address] [place], [place] [address]  Culture, blood (Routine x 2)     Status: Abnormal (Preliminary result)   Collection Time: 04/25/20  5:55 PM   Specimen: BLOOD  Result Value Ref Range Status   Specimen Description   Final    BLOOD BLOOD LEFT FOREARM Performed at [facility], [address], [place], [place] [address]    Special Requests   Final    BOTTLES DRAWN AEROBIC AND ANAEROBIC Blood Culture adequate volume Performed at [facility], [place],  [place], [place] [address]    Culture  Setup Time   Final    GRAM NEGATIVE RODS AEROBIC BOTTLE ONLY CRITICAL VALUE NOTED.  VALUE IS CONSISTENT WITH PREVIOUSLY  REPORTED AND CALLED VALUE. Performed at [facility], [address] [place], [place] [address]    Culture ESCHERICHIA COLI (A)  Final   Report Status PENDING  Incomplete  SARS Coronavirus 2 by RT PCR (hospital order, performed in [facility] hospital lab) Nasopharyngeal Nasopharyngeal Swab     Status: None   Collection Time: 04/25/20  5:55 PM   Specimen: Nasopharyngeal Swab  Result Value Ref Range Status   SARS Coronavirus 2 NEGATIVE NEGATIVE Final    Comment: (NOTE) SARS-CoV-2 target nucleic acids are NOT DETECTED.  The SARS-CoV-2 RNA is generally detectable in upper and lower respiratory specimens during the acute phase of infection. The lowest concentration of SARS-CoV-2 viral copies this assay can detect is 250 copies / mL. A negative result does not preclude SARS-CoV-2 infection and should not be used as the sole basis for treatment or other patient management decisions.  A negative result may occur with improper specimen collection / handling, submission of specimen other than nasopharyngeal swab, presence of viral mutation(s) within the areas targeted by this assay, and inadequate number of viral copies (<250 copies / mL). A negative result must be combined with clinical observations, patient history, and epidemiological information.  Fact Sheet for Patients:   StrictlyIdeas.no  Fact Sheet for Healthcare Providers: BankingDealers.co.za  This test is not yet approved or  cleared by the [place] FDA and has been authorized for detection and/or diagnosis of SARS-CoV-2 by FDA under an Emergency Use Authorization (EUA).  This EUA will remain in effect (meaning this test can be used) for the duration of the COVID-19 declaration under Section 564(b)(1) of the Act, 21 U.S.C. section 360bbb-3(b)(1), unless the authorization is terminated or revoked sooner.  Performed at [facility], [address]  [place], [place] [address]   Urine culture     Status: Abnormal   Collection Time: 04/25/20  6:26 PM   Specimen: In/Out Cath Urine  Result Value Ref Range Status   Specimen Description   Final    IN/OUT CATH URINE Performed at [facility], [address] [place], [place] [address]    Special Requests   Final    NONE Performed at [facility], [address] [place], [place] [address]    Culture MULTIPLE SPECIES PRESENT, SUGGEST RECOLLECTION (A)  Final   Report Status 04/26/2020 FINAL  Final  C Difficile Quick Screen w PCR reflex     Status: None   Collection Time: 04/27/20 11:33 AM   Specimen: STOOL  Result Value Ref Range Status   C Diff antigen NEGATIVE NEGATIVE Final   C Diff toxin NEGATIVE NEGATIVE Final   C Diff interpretation No C. difficile detected.  Final    Comment: Performed at [facility], [address] [place], [place] [address]  Gastrointestinal Panel by PCR , Stool     Status: None   Collection Time: 04/27/20 11:33 AM   Specimen: STOOL  Result Value Ref Range Status   Campylobacter species NOT DETECTED NOT DETECTED Final   Plesimonas shigelloides NOT DETECTED NOT DETECTED Final   Salmonella species NOT DETECTED NOT DETECTED Final   Yersinia enterocolitica NOT DETECTED NOT DETECTED Final   Vibrio species NOT DETECTED NOT DETECTED Final   Vibrio cholerae NOT DETECTED NOT DETECTED Final   Enteroaggregative  E coli (EAEC) NOT DETECTED NOT DETECTED Final   Enteropathogenic E coli (EPEC) NOT DETECTED NOT DETECTED Final   Enterotoxigenic E coli (ETEC) NOT DETECTED NOT DETECTED Final   Shiga like toxin producing E coli (STEC) NOT DETECTED NOT DETECTED Final   Shigella/Enteroinvasive E coli (EIEC) NOT DETECTED NOT DETECTED Final   Cryptosporidium NOT DETECTED NOT DETECTED Final   Cyclospora cayetanensis NOT DETECTED NOT DETECTED Final   Entamoeba histolytica NOT DETECTED NOT DETECTED Final   Giardia lamblia NOT DETECTED NOT DETECTED Final    Adenovirus F40/41 NOT DETECTED NOT DETECTED Final   Astrovirus NOT DETECTED NOT DETECTED Final   Norovirus GI/GII NOT DETECTED NOT DETECTED Final   Rotavirus A NOT DETECTED NOT DETECTED Final   Sapovirus (I, II, IV, and V) NOT DETECTED NOT DETECTED Final    Comment: Performed at [facility], [address], [place], [place] [address]     Radiology Studies: CT ABDOMEN PELVIS W CONTRAST  Result Date: 04/25/2020 CLINICAL DATA:  Nausea, vomiting, diarrhea, epigastric abdominal pain EXAM: CT ABDOMEN AND PELVIS WITH CONTRAST TECHNIQUE: Multidetector CT imaging of the abdomen and pelvis was performed using the standard protocol following bolus administration of intravenous contrast. CONTRAST:  198mL OMNIPAQUE IOHEXOL 300 MG/ML  SOLN COMPARISON:  02/17/2017 FINDINGS: Lower chest: A peripheral nodular opacity is seen within the right lung base posteriorly, not fully characterized on this examination and indeterminate. This region was not fully included on prior examination. Cardiac size within normal limits. No pericardial effusion. Moderate hiatal hernia present. Hepatobiliary: Cholelithiasis is present without pericholecystic inflammatory change identified. Liver unremarkable. No intra or extrahepatic biliary ductal dilation. Pancreas: Unremarkable Spleen: Unremarkable Adrenals/Urinary Tract: The adrenal glands are unremarkable. The kidneys are normal in size and position. There is asymmetric cortical calcification and cortical scarring involving the interpolar region of the right kidney. A stable 7 mm calculus is seen contained within an upper pole probable caliceal diverticulum. At least 1 2 mm nonobstructing renal calculus is identified within the right kidney. No additional renal or ureteral calculi are identified. No hydronephrosis. Multiple simple cortical cysts are seen within the right kidney. The bladder is unremarkable. Stomach/Bowel: There is lobular eccentric wall thickening and a  polypoid mass involving the cecum in the region of the ileocecal junction. This has enlarged since prior examination. This is best seen on axial image # 47 and sagittal image # 37. The stomach, small bowel, and large bowel are otherwise unremarkable. Appendix normal. No free intraperitoneal gas or fluid. Vascular/Lymphatic: The abdominal vasculature is age-appropriate with moderate aortoiliac atherosclerotic calcification. No aneurysm. No pathologic adenopathy within the abdomen and pelvis. Reproductive: Multiple enhancing masses are seen within the uterine corpus in keeping with multiple uterine fibroids. No adnexal masses are seen. Other: The rectum is unremarkable. Tiny fat containing broad-based umbilical hernia is present. Musculoskeletal: Degenerative changes are seen within the lumbar spine. Multiple vertebral hemangiomas are seen within the T8 and T12 vertebral bodies. No suspicious lytic or blastic bone lesions are identified IMPRESSION: 1. Interval increase in size of the endoluminal polypoid mass involving the cecum in the region of the ileocecal junction. Given its slow interval growth since prior examination, this may represent a hyperplastic or hamartomatous polyp, however, this is not well characterized on this examination. Colonoscopy is recommended for further evaluation. 2. Cholelithiasis without evidence of acute cholecystitis. 3. Moderate hiatal hernia. 4. Nonobstructing right nephrolithiasis. No urolithiasis. No hydronephrosis. Aortic Atherosclerosis (ICD10-I70.0). Electronically Signed   By: Fidela Salisbury MD   On:  04/25/2020 20:26   US ABDOMEN LIMITED RUQ  Result Date: 04/26/2020 CLINICAL DATA:  Elevated lipase. EXAM: ULTRASOUND ABDOMEN LIMITED RIGHT UPPER QUADRANT COMPARISON:  None. FINDINGS: Gallbladder: Multiple subcentimeter shadowing echogenic gallstones are seen within the gallbladder lumen (the largest measures approximately 3.9 mm). There is no evidence of gallbladder wall  thickening (1.8 mm). No sonographic Murphy sign noted by sonographer. Common bile duct: Diameter: 4.7 mm Liver: No focal lesion identified. Within normal limits in parenchymal echogenicity. Portal vein is patent on color Doppler imaging with normal direction of blood flow towards the liver. Other: None. IMPRESSION: Cholelithiasis without evidence of acute cholecystitis. Electronically Signed   By: Virgina Norfolk M.D.   On: 04/26/2020 01:28   US ABDOMEN LIMITED RUQ  Final Result    CT ABDOMEN PELVIS W CONTRAST  Final Result    DG Chest Port 1 View  Final Result    CT HEAD WO CONTRAST  Final Result      Scheduled Meds: . brimonidine  1 drop Both Eyes BID  . dorzolamide-timolol  1 drop Both Eyes BID  . enoxaparin (LOVENOX) injection  40 mg Subcutaneous Q24H  . losartan  100 mg Oral Daily   And  . hydrochlorothiazide  25 mg Oral Daily  . insulin aspart  0-5 Units Subcutaneous QHS  . insulin aspart  0-9 Units Subcutaneous TID WC  . latanoprost  1 drop Both Eyes QHS  . levothyroxine  100 mcg Oral QAC breakfast  . multivitamin with minerals  1 tablet Oral Daily  . pantoprazole  40 mg Oral Daily  . pilocarpine  2 drop Both Eyes QID  . polyethylene glycol-electrolytes  4,000 mL Oral Once  . timolol  1 drop Both Eyes BID   PRN Meds: acetaminophen **OR** acetaminophen, ondansetron **OR** ondansetron (ZOFRAN) IV Continuous Infusions: . sodium chloride    . cefTRIAXone (ROCEPHIN)  IV Stopped (04/27/20 0910)  . lactated ringers 125 mL/hr at 04/27/20 1430  . metronidazole 500 mg (04/27/20 1431)      LOS: 2 days  Time spent: Greater than 50% of the 35 minute visit was spent in counseling/coordination of care for the patient as laid out in the A&P.   Dwyane Dee, MD [facility] 04/27/2020, 6:41 PM  Contact via secure chat.  To contact the attending provider between 7A-7P or the covering provider during after hours 7P-7A, please log into the web site [email or web address] and  access using universal [place] password for that web site. If you do not have the password, please call the hospital operator.

## 2020-04-27 NOTE — ED Notes (Signed)
Stool samples sent.  Pt adjusted in bed.  Given ginger ale.

## 2020-04-27 NOTE — ED Notes (Signed)
Pt O2 steady at 88% while sleeping. Pt placed on 2L O2 by nasal cannula. O2 at 97%, post oxygen.

## 2020-04-27 NOTE — ED Notes (Signed)
Messaged md regarding pts bp over 825 systolic. PT resting in bed at this time. Opened eyes to say hello to this nurse and closed eyes to rest again .

## 2020-04-28 ENCOUNTER — Encounter
Admission: EM | Disposition: A | Discharge status: Home / Self Care | Payer: Self-pay | Source: Home / Self Care | Attending: Internal Medicine

## 2020-04-28 ENCOUNTER — Inpatient Hospital Stay: Payer: Medicare PPO | Admitting: Anesthesiology

## 2020-04-28 ENCOUNTER — Encounter: Payer: Self-pay | Admitting: Gastroenterology

## 2020-04-28 DIAGNOSIS — R748 Abnormal levels of other serum enzymes: Secondary | ICD-10-CM

## 2020-04-28 DIAGNOSIS — K635 Polyp of colon: Secondary | ICD-10-CM

## 2020-04-28 DIAGNOSIS — R7881 Bacteremia: Secondary | ICD-10-CM

## 2020-04-28 HISTORY — PX: COLONOSCOPY WITH PROPOFOL: SHX5780

## 2020-04-28 LAB — CBC WITH DIFFERENTIAL/PLATELET
Abs Immature Granulocytes: 0.03 10*3/uL (ref 0.00–0.07)
Basophils Absolute: 0 10*3/uL (ref 0.0–0.1)
Basophils Relative: 1 %
Eosinophils Absolute: 0.3 10*3/uL (ref 0.0–0.5)
Eosinophils Relative: 6 %
HCT: 32.8 % — ABNORMAL LOW (ref 36.0–46.0)
Hemoglobin: 10.8 g/dL — ABNORMAL LOW (ref 12.0–15.0)
Immature Granulocytes: 1 %
Lymphocytes Relative: 23 %
Lymphs Abs: 1.4 10*3/uL (ref 0.7–4.0)
MCH: 28.4 pg (ref 26.0–34.0)
MCHC: 32.9 g/dL (ref 30.0–36.0)
MCV: 86.3 fL (ref 80.0–100.0)
Monocytes Absolute: 0.6 10*3/uL (ref 0.1–1.0)
Monocytes Relative: 10 %
Neutro Abs: 3.5 10*3/uL (ref 1.7–7.7)
Neutrophils Relative %: 59 %
Platelets: 281 10*3/uL (ref 150–400)
RBC: 3.8 MIL/uL — ABNORMAL LOW (ref 3.87–5.11)
RDW: 12.6 % (ref 11.5–15.5)
WBC: 5.8 10*3/uL (ref 4.0–10.5)
nRBC: 0 % (ref 0.0–0.2)

## 2020-04-28 LAB — CULTURE, BLOOD (ROUTINE X 2)
Special Requests: ADEQUATE
Special Requests: ADEQUATE

## 2020-04-28 LAB — GLUCOSE, CAPILLARY
Glucose-Capillary: 125 mg/dL — ABNORMAL HIGH (ref 70–99)
Glucose-Capillary: 130 mg/dL — ABNORMAL HIGH (ref 70–99)
Glucose-Capillary: 169 mg/dL — ABNORMAL HIGH (ref 70–99)

## 2020-04-28 LAB — COMPREHENSIVE METABOLIC PANEL
ALT: 48 U/L — ABNORMAL HIGH (ref 0–44)
AST: 45 U/L — ABNORMAL HIGH (ref 15–41)
Albumin: 3 g/dL — ABNORMAL LOW (ref 3.5–5.0)
Alkaline Phosphatase: 91 U/L (ref 38–126)
Anion gap: 8 (ref 5–15)
BUN: 5 mg/dL — ABNORMAL LOW (ref 8–23)
CO2: 29 mmol/L (ref 22–32)
Calcium: 8.6 mg/dL — ABNORMAL LOW (ref 8.9–10.3)
Chloride: 101 mmol/L (ref 98–111)
Creatinine, Ser: 0.53 mg/dL (ref 0.44–1.00)
GFR calc Af Amer: >60 mL/min (ref >60)
GFR calc non Af Amer: >60 mL/min (ref >60)
Glucose, Bld: 126 mg/dL — ABNORMAL HIGH (ref 70–99)
Potassium: 3 mmol/L — ABNORMAL LOW (ref 3.5–5.1)
Sodium: 138 mmol/L (ref 135–145)
Total Bilirubin: 0.5 mg/dL (ref 0.3–1.2)
Total Protein: 6.9 g/dL (ref 6.5–8.1)

## 2020-04-28 LAB — MAGNESIUM: Magnesium: 2 mg/dL (ref 1.7–2.4)

## 2020-04-28 SURGERY — COLONOSCOPY WITH PROPOFOL
Anesthesia: General

## 2020-04-28 MED ORDER — AMOXICILLIN-POT CLAVULANATE 875-125 MG PO TABS: 1.0000 | ORAL_TABLET | Freq: Two times a day (BID) | ORAL | 0 refills | Status: AC

## 2020-04-28 MED ORDER — PROPOFOL 10 MG/ML IV BOLUS
INTRAVENOUS | Status: DC | PRN
  Administered 2020-04-28: 50 mg via INTRAVENOUS

## 2020-04-28 MED ORDER — SODIUM CHLORIDE 0.9 % IV SOLN
3.0000 g | Freq: Four times a day (QID) | INTRAVENOUS | Status: DC
  Filled 2020-04-28 (×5): qty 8

## 2020-04-28 MED ORDER — LABETALOL HCL 5 MG/ML IV SOLN
10.0000 mg | INTRAVENOUS | Status: DC | PRN
  Administered 2020-04-28: 10 mg via INTRAVENOUS
  Filled 2020-04-28: qty 4

## 2020-04-28 MED ORDER — PROPOFOL 10 MG/ML IV BOLUS
INTRAVENOUS | Status: AC
  Filled 2020-04-28: qty 20

## 2020-04-28 MED ORDER — POTASSIUM CHLORIDE CRYS ER 20 MEQ PO TBCR
40.0000 meq | EXTENDED_RELEASE_TABLET | ORAL | Status: DC
  Administered 2020-04-28: 40 meq via ORAL
  Filled 2020-04-28 (×2): qty 2

## 2020-04-28 MED ORDER — PROPOFOL 500 MG/50ML IV EMUL
INTRAVENOUS | Status: DC | PRN
  Administered 2020-04-28: 130 ug/kg/min via INTRAVENOUS

## 2020-04-28 MED ORDER — HYDRALAZINE HCL 20 MG/ML IJ SOLN: 10.0000 mg | INTRAMUSCULAR | Status: DC | PRN

## 2020-04-28 MED ORDER — SODIUM CHLORIDE 0.9 % IV SOLN
3.0000 g | Freq: Four times a day (QID) | INTRAVENOUS | Status: DC
  Administered 2020-04-28: 3 g via INTRAVENOUS
  Filled 2020-04-28 (×5): qty 8

## 2020-04-28 NOTE — Care Management Important Message (Signed)
Important Message  Patient Details  Name: Heather Small MRN: [id number] Date of Birth: [birth year]   Medicare Important Message Given:  Yes     Dannette Barbara 04/28/2020, 2:13 PM

## 2020-04-28 NOTE — Anesthesia Postprocedure Evaluation (Signed)
Anesthesia Post Note  Patient: Heather Small  Procedure(s) Performed: COLONOSCOPY WITH PROPOFOL (N/A )  Patient location during evaluation: PACU Anesthesia Type: General Level of consciousness: awake and alert Pain management: pain level controlled Vital Signs Assessment: post-procedure vital signs reviewed and stable Respiratory status: spontaneous breathing, nonlabored ventilation and respiratory function stable Cardiovascular status: blood pressure returned to baseline and stable Postop Assessment: no apparent nausea or vomiting Anesthetic complications: no   No complications documented.   Last Vitals:  Vitals:   04/28/20 1015 04/28/20 1055  BP: (!) 176/86 (!) 109/46  Pulse: 83 89  Resp: 20 16  Temp: 36.4 C 36.7 C  SpO2: 96% 95%    Last Pain:  Vitals:   04/28/20 1125  TempSrc:   PainSc: 0-No pain                 Brett Canales Demri Poulton

## 2020-04-28 NOTE — Progress Notes (Signed)
Cancellation Note  Pt currently off unit for procedure.  Will re-attempt PT treatment session at a later date/time.  Leitha Bleak, PT 04/28/20, 11:17 AM

## 2020-04-28 NOTE — Discharge Instructions (Signed)
Nausea, Adult Nausea is feeling sick to your stomach or feeling that you are about to throw up (vomit). Feeling sick to your stomach is usually not serious, but it may be an early sign of a more serious medical problem. As you feel sicker to your stomach, you may throw up. If you throw up, or if you are not able to drink enough fluids, there is a risk that you may lose too much water in your body (get dehydrated). If you lose too much water in your body, you may:  Feel tired.  Feel thirsty.  Have a dry mouth.  Have cracked lips.  Go pee (urinate) less often. Older adults and people who have other diseases or a weak body defense system (immune system) have a higher risk of losing too much water in the body. The main goals of treating this condition are:  To relieve your nausea.  To ensure your nausea occurs less often.  To prevent throwing up and losing too much fluid. Follow these instructions at home: Watch your symptoms for any changes. Tell your doctor about them. Follow these instructions as told by your doctor. Eating and drinking      Take an ORS (oral rehydration solution). This is a drink that is sold at pharmacies and stores.  Drink clear fluids in small amounts as you are able. These include: ? Water. ? Ice chips. ? Fruit juice that has water added (diluted fruit juice). ? Low-calorie sports drinks.  Eat bland, easy-to-digest foods in small amounts as you are able, such as: ? Bananas. ? Applesauce. ? Rice. ? Low-fat (lean) meats. ? Toast. ? Crackers.  Avoid drinking fluids that have a lot of sugar or caffeine in them. This includes energy drinks, sports drinks, and soda.  Avoid alcohol.  Avoid spicy or fatty foods. General instructions  Take over-the-counter and prescription medicines only as told by your doctor.  Rest at home while you get better.  Drink enough fluid to keep your pee (urine) pale yellow.  Take slow and deep breaths when you feel  sick to your stomach.  Avoid food or things that have strong smells.  Wash your hands often with soap and water. If you cannot use soap and water, use hand sanitizer.  Make sure that all people in your home wash their hands well and often.  Keep all follow-up visits as told by your doctor. This is important. Contact a doctor if:  You feel sicker to your stomach.  You feel sick to your stomach for more than 2 days.  You throw up.  You are not able to drink fluids without throwing up.  You have new symptoms.  You have a fever.  You have a headache.  You have muscle cramps.  You have a rash.  You have pain while peeing.  You feel light-headed or dizzy. Get help right away if:  You have pain in your chest, neck, arm, or jaw.  You feel very weak or you pass out (faint).  You have throw up that is bright red or looks like coffee grounds.  You have bloody or black poop (stools) or poop that looks like tar.  You have a very bad headache, a stiff neck, or both.  You have very bad pain, cramping, or bloating in your belly (abdomen).  You have trouble breathing or you are breathing very quickly.  Your heart is beating very quickly.  Your skin feels cold and clammy.  You feel confused.    You have signs of losing too much water in your body, such as: ? Dark pee, very little pee, or no pee. ? Cracked lips. ? Dry mouth. ? Sunken eyes. ? Sleepiness. ? Weakness. These symptoms may be an emergency. Do not wait to see if the symptoms will go away. Get medical help right away. Call your local emergency services (911 in the [place] Do not drive yourself to the hospital. Summary  Nausea is feeling sick to your stomach or feeling that you are about to throw up (vomit).  If you throw up, or if you are not able to drink enough fluids, there is a risk that you may lose too much water in your body (get dehydrated).  Eat and drink what your doctor tells you. Take  over-the-counter and prescription medicines only as told by your doctor.  Contact a doctor right away if your symptoms get worse or you have new symptoms.  Keep all follow-up visits as told by your doctor. This is important. This information is not intended to replace advice given to you by your health care provider. Make sure you discuss any questions you have with your health care provider. Document Revised: 01/06/2018 Document Reviewed: 01/06/2018 Elsevier Patient Education  2020 [employer]  

## 2020-04-28 NOTE — Anesthesia Preprocedure Evaluation (Addendum)
Anesthesia Evaluation  Patient identified by MRN, date of birth, ID band Patient awake    Reviewed: Allergy & Precautions, H&P , NPO status , Patient's Chart, lab work & pertinent test results  History of Anesthesia Complications Negative for: history of anesthetic complications  Airway Mallampati: II      Comment: TM 3 FB Dental  (+) Edentulous Upper   Pulmonary neg pulmonary ROS, neg sleep apnea, neg COPD,    breath sounds clear to auscultation       Cardiovascular hypertension, (-) angina(-) Past MI and (-) Cardiac Stents (-) dysrhythmias  Rhythm:regular Rate:Normal     Neuro/Psych negative neurological ROS  negative psych ROS   GI/Hepatic negative GI ROS, Neg liver ROS,   Endo/Other  diabetesHypothyroidism   Renal/GU negative Renal ROS  negative genitourinary   Musculoskeletal   Abdominal   Peds  Hematology  (+) Blood dyscrasia, anemia , Hgb 10.8   Anesthesia Other Findings Admitted with abd pain and sepsis, now improved s/p 3 days of IVF and antibiotics.  Colon mass.   Denies N/V since admission.  Resp: RA CV: no pressors Access: PIV x 2  Past Medical History: No date: Diabetes mellitus without complication (HCC) No date: Hyperlipemia No date: Hypertension No date: Hypothyroidism No date: Thyroid disease  Past Surgical History: No date: COLONOSCOPY No date: ESOPHAGEAL DILATION 07/13/2018: ESOPHAGOGASTRODUODENOSCOPY (EGD) WITH PROPOFOL; N/A     Comment:  Procedure: ESOPHAGOGASTRODUODENOSCOPY (EGD) WITH               PROPOFOL;  Surgeon: Jonathon Bellows, MD;  Location: [facility]               ENDOSCOPY;  Service: Gastroenterology;  Laterality: N/A; 07/30/2018: ESOPHAGOGASTRODUODENOSCOPY (EGD) WITH PROPOFOL; N/A     Comment:  Procedure: ESOPHAGOGASTRODUODENOSCOPY (EGD) WITH               PROPOFOL;  Surgeon: Jonathon Bellows, MD;  Location: [facility]               ENDOSCOPY;  Service: Gastroenterology;  Laterality:  N/A; 08/18/2018: ESOPHAGOGASTRODUODENOSCOPY (EGD) WITH PROPOFOL; N/A     Comment:  Procedure: EGD with Dilation;  Surgeon: Jonathon Bellows, MD;              Location: [facility] ENDOSCOPY;  Service: Gastroenterology;                Laterality: N/A; 09/29/2018: ESOPHAGOGASTRODUODENOSCOPY (EGD) WITH PROPOFOL; N/A     Comment:  Procedure: ESOPHAGOGASTRODUODENOSCOPY (EGD) WITH               PROPOFOL;  Surgeon: Jonathon Bellows, MD;  Location: [facility]               ENDOSCOPY;  Service: Gastroenterology;  Laterality: N/A; No date: MASTECTOMY; Left  BMI    Body Mass Index: 26.04 kg/m      Reproductive/Obstetrics negative OB ROS                            Anesthesia Physical Anesthesia Plan  ASA: III  Anesthesia Plan: General   Post-op Pain Management:    Induction:   PONV Risk Score and Plan: Propofol infusion and TIVA  Airway Management Planned: Simple Face Mask  Additional Equipment:   Intra-op Plan:   Post-operative Plan:   Informed Consent: I have reviewed the patients History and Physical, chart, labs and discussed the procedure including the risks, benefits and alternatives for the proposed anesthesia with the patient or  authorized representative who has indicated his/her understanding and acceptance.     Dental Advisory Given  Plan Discussed with: Anesthesiologist, CRNA and Surgeon  Anesthesia Plan Comments:         Anesthesia Quick Evaluation

## 2020-04-28 NOTE — Progress Notes (Signed)
Pt for d/c home. Instruction given to pt and daughter. Verbalized understanding of instructions given. Condition stable. No changes to initial AM assessment at this time.

## 2020-04-28 NOTE — Transfer of Care (Signed)
Immediate Anesthesia Transfer of Care Note  Patient: Heather Small  Procedure(s) Performed: COLONOSCOPY WITH PROPOFOL (N/A )  Patient Location: PACU and Endoscopy Unit  Anesthesia Type:General  Level of Consciousness: drowsy  Airway & Oxygen Therapy: Patient Spontanous Breathing  Post-op Assessment: Report given to RN  Post vital signs: stable  Last Vitals:  Vitals Value Taken Time  BP    Temp    Pulse    Resp    SpO2      Last Pain:  Vitals:   04/28/20 1015  TempSrc: Temporal  PainSc: 0-No pain         Complications: No complications documented.

## 2020-04-28 NOTE — Consult Note (Signed)
SURGICAL CONSULTATION NOTE   HISTORY OF PRESENT ILLNESS (HPI):  84 y.o. female presented to [facility] ED for evaluation of altered mental status.  Patient was admitted with the diagnosis of sepsis.  She was found with fever, elevated white blood cell, elevated blood pressures.  She was empirically treated with antibiotic therapy.  Eventually blood cultures came with E. coli.  She responded adequately to antibiotic therapy.  Today patient without abdominal pain.  There is no nausea or vomiting.  Patient oriented in person place and time.  On complete work-up she had CT scan of the abdominal pelvis.  I personally evaluated the CT scan images.  This shows a large mass of the cecum.  She had a colonoscopy that showed a large mass at the cecum.  Biopsy was taken.  I personally evaluated the images of the mass from the colonoscopy pictures.  Surgery is consulted by Dr. Sabino Gasser in this context for evaluation and management of ascending colon mass.  PAST MEDICAL HISTORY (PMH):  Past Medical History:  Diagnosis Date  . Diabetes mellitus without complication ([place])   . Hyperlipemia   . Hypertension   . Hypothyroidism   . Thyroid disease      PAST SURGICAL HISTORY ([place]):  Past Surgical History:  Procedure Laterality Date  . COLONOSCOPY    . COLONOSCOPY WITH PROPOFOL N/A 04/28/2020   Procedure: COLONOSCOPY WITH PROPOFOL;  Surgeon: Jonathon Bellows, MD;  Location: [facility] ENDOSCOPY;  Service: Gastroenterology;  Laterality: N/A;  . ESOPHAGEAL DILATION    . ESOPHAGOGASTRODUODENOSCOPY (EGD) WITH PROPOFOL N/A 07/13/2018   Procedure: ESOPHAGOGASTRODUODENOSCOPY (EGD) WITH PROPOFOL;  Surgeon: Jonathon Bellows, MD;  Location: [facility] ENDOSCOPY;  Service: Gastroenterology;  Laterality: N/A;  . ESOPHAGOGASTRODUODENOSCOPY (EGD) WITH PROPOFOL N/A 07/30/2018   Procedure: ESOPHAGOGASTRODUODENOSCOPY (EGD) WITH PROPOFOL;  Surgeon: Jonathon Bellows, MD;  Location: [facility] ENDOSCOPY;  Service: Gastroenterology;  Laterality: N/A;  .  ESOPHAGOGASTRODUODENOSCOPY (EGD) WITH PROPOFOL N/A 08/18/2018   Procedure: EGD with Dilation;  Surgeon: Jonathon Bellows, MD;  Location: [facility] ENDOSCOPY;  Service: Gastroenterology;  Laterality: N/A;  . ESOPHAGOGASTRODUODENOSCOPY (EGD) WITH PROPOFOL N/A 09/29/2018   Procedure: ESOPHAGOGASTRODUODENOSCOPY (EGD) WITH PROPOFOL;  Surgeon: Jonathon Bellows, MD;  Location: [facility] ENDOSCOPY;  Service: Gastroenterology;  Laterality: N/A;  . MASTECTOMY Left      MEDICATIONS:  Prior to Admission medications   Medication Sig Start Date End Date Taking? Authorizing Provider  brimonidine (ALPHAGAN) 0.15 % ophthalmic solution Place 1 drop into both eyes 2 (two) times daily. 04/11/15  Yes [provider]  dorzolamide-timolol (COSOPT) 22.3-6.8 MG/ML ophthalmic solution 1 drop 2 (two) times daily.   Yes [provider]  levothyroxine (SYNTHROID) 100 MCG tablet Take 100 mcg by mouth daily before breakfast.   Yes [provider]  losartan-hydrochlorothiazide (HYZAAR) 100-25 MG tablet losartan 100 mg-hydrochlorothiazide 25 mg tablet   Yes [provider]  LUMIGAN 0.01 % SOLN Place 1 drop into both eyes at bedtime. 04/11/15  Yes [provider]  metFORMIN (GLUCOPHAGE) 500 MG tablet Take by mouth 2 (two) times daily with a meal.   Yes [provider]  Multiple Vitamin (MULTIVITAMIN) tablet Take 1 tablet by mouth daily.   Yes [provider]  omeprazole (PRILOSEC) 40 MG capsule Take 40 mg by mouth in the morning and at bedtime.   Yes [provider]  pilocarpine (PILOCAR) 4 % ophthalmic solution Place 2 drops into both eyes 4 (four) times daily.   Yes [provider]  simvastatin (ZOCOR) 20 MG tablet Take 1 tablet by mouth  at bedtime. 06/15/15  Yes [provider]  timolol (TIMOPTIC) 0.5 % ophthalmic solution Place 1 drop into both eyes 2 (two) times daily. 04/11/15  Yes [provider]  valsartan-hydrochlorothiazide (DIOVAN-HCT) 320-25 MG  tablet Take 1 tablet by mouth daily. 04/13/15 01/23/20  [provider]     ALLERGIES:  Allergies  Allergen Reactions  . Codeine Anaphylaxis and Other (See Comments)    Couldn't swallow, was rushed to hospital     SOCIAL HISTORY:  Social History   Socioeconomic History  . Marital status: Widowed    Spouse name: Not on file  . Number of children: Not on file  . Years of education: Not on file  . Highest education level: Not on file  Occupational History  . Not on file  Tobacco Use  . Smoking status: Never Smoker  . Smokeless tobacco: Never Used  Vaping Use  . Vaping Use: Never used  Substance and Sexual Activity  . Alcohol use: No  . Drug use: No  . Sexual activity: Not on file  Other Topics Concern  . Not on file  Social History Narrative  . Not on file   Social Determinants of Health   Financial Resource Strain:   . Difficulty of Paying Living Expenses: Not on file  Food Insecurity:   . Worried About Charity fundraiser in the Last Year: Not on file  . Ran Out of Food in the Last Year: Not on file  Transportation Needs:   . Lack of Transportation (Medical): Not on file  . Lack of Transportation (Non-Medical): Not on file  Physical Activity:   . Days of Exercise per Week: Not on file  . Minutes of Exercise per Session: Not on file  Stress:   . Feeling of Stress : Not on file  Social Connections:   . Frequency of Communication with Friends and Family: Not on file  . Frequency of Social Gatherings with Friends and Family: Not on file  . Attends Religious Services: Not on file  . Active Member of Clubs or Organizations: Not on file  . Attends Archivist Meetings: Not on file  . Marital Status: Not on file  Intimate Partner Violence:   . Fear of Current or Ex-Partner: Not on file  . Emotionally Abused: Not on file  . Physically Abused: Not on file  . Sexually Abused: Not on file      FAMILY HISTORY:  Family History  Problem Relation Age  of Onset  . Diabetes Other   . Heart Problems Mother   . Cancer Father      REVIEW OF SYSTEMS:  Constitutional: denies weight loss, fever, chills, or sweats  Eyes: denies any other vision changes, history of eye injury  ENT: denies sore throat, hearing problems  Respiratory: denies shortness of breath, wheezing  Cardiovascular: denies chest pain, palpitations  Gastrointestinal: Positive abdominal pain, nausea and vomiting Genitourinary: denies burning with urination or urinary frequency Musculoskeletal: denies any other joint pains or cramps  Skin: denies any other rashes or skin discolorations  Neurological: denies any other headache, dizziness, weakness  Psychiatric: denies any other depression, anxiety   All other review of systems were negative   VITAL SIGNS:  Temp:  [97.6 F (36.4 C)-98.6 F (37 C)] 98.4 F (36.9 C) (09/17 1543) Pulse Rate:  [73-98] 88 (09/17 1543) Resp:  [12-20] 20 (09/17 1543) BP: (109-180)/(46-91) 159/79 (09/17 1543) SpO2:  [95 %-99 %] 97 % (09/17 1543)  Height: 5\' 3"  (160 cm) Weight: 66.7 kg BMI (Calculated): 26.05   INTAKE/OUTPUT:  This shift: Total I/O In: 300 [I.V.:300] Out: 600 [Urine:600]  Last 2 shifts: @IOLAST2SHIFTS @   PHYSICAL EXAM:  Constitutional:  -- Normal body habitus  -- Awake, alert, and oriented x3  Eyes:  -- Pupils equally round and reactive to light  -- No scleral icterus  Ear, nose, and throat:  -- No jugular venous distension  Pulmonary:  -- No crackles  -- Equal breath sounds bilaterally -- Breathing non-labored at rest Cardiovascular:  -- S1, S2 present  -- No pericardial rubs Gastrointestinal:  -- Abdomen soft, nontender, non-distended, no guarding or rebound tenderness -- No abdominal masses appreciated, pulsatile or otherwise  Musculoskeletal and Integumentary:  -- Wounds: None appreciated -- Extremities: B/L UE and LE FROM, hands and feet warm, no edema  Neurologic:  -- Motor function: intact and  symmetric -- Sensation: intact and symmetric   Labs:  CBC Latest Ref Rng & Units 04/28/2020 04/27/2020 04/26/2020  WBC 4.0 - 10.5 K/uL 5.8 7.5 11.2(H)  Hemoglobin 12.0 - 15.0 g/dL 10.8(L) 9.5(L) 9.8(L)  Hematocrit 36 - 46 % 32.8(L) 29.7(L) 30.2(L)  Platelets 150 - 400 K/uL 281 247 254   CMP Latest Ref Rng & Units 04/28/2020 04/27/2020 04/26/2020  Glucose 70 - 99 mg/dL 126(H) 130(H) 139(H)  BUN 8 - 23 mg/dL 5(L) 8 10  Creatinine 0.44 - 1.00 mg/dL 0.53 0.66 0.70  Sodium 135 - 145 mmol/L 138 138 138  Potassium 3.5 - 5.1 mmol/L 3.0(L) 3.2(L) 3.1(L)  Chloride 98 - 111 mmol/L 101 102 103  CO2 22 - 32 mmol/L 29 30 28   Calcium 8.9 - 10.3 mg/dL 8.6(L) 8.4(L) 8.3(L)  Total Protein 6.5 - 8.1 g/dL 6.9 6.2(L) 6.5  Total Bilirubin 0.3 - 1.2 mg/dL 0.5 0.5 0.7  Alkaline Phos 38 - 126 U/L 91 79 74  AST 15 - 41 U/L 45(H) 61(H) 97(H)  ALT 0 - 44 U/L 48(H) 53(H) 57(H)    Imaging studies:  CLINICAL DATA:  Nausea, vomiting, diarrhea, epigastric abdominal pain  EXAM: CT ABDOMEN AND PELVIS WITH CONTRAST  TECHNIQUE: Multidetector CT imaging of the abdomen and pelvis was performed using the standard protocol following bolus administration of intravenous contrast.  CONTRAST:  137mL OMNIPAQUE IOHEXOL 300 MG/ML  SOLN  COMPARISON:  02/17/2017  FINDINGS: Lower chest: A peripheral nodular opacity is seen within the right lung base posteriorly, not fully characterized on this examination and indeterminate. This region was not fully included on prior examination. Cardiac size within normal limits. No pericardial effusion. Moderate hiatal hernia present.  Hepatobiliary: Cholelithiasis is present without pericholecystic inflammatory change identified. Liver unremarkable. No intra or extrahepatic biliary ductal dilation.  Pancreas: Unremarkable  Spleen: Unremarkable  Adrenals/Urinary Tract: The adrenal glands are unremarkable. The kidneys are normal in size and position. There is  asymmetric cortical calcification and cortical scarring involving the interpolar region of the right kidney. A stable 7 mm calculus is seen contained within an upper pole probable caliceal diverticulum. At least 1 2 mm nonobstructing renal calculus is identified within the right kidney. No additional renal or ureteral calculi are identified. No hydronephrosis. Multiple simple cortical cysts are seen within the right kidney. The bladder is unremarkable.  Stomach/Bowel: There is lobular eccentric wall thickening and a polypoid mass involving the cecum in the region of the ileocecal junction. This has enlarged since prior examination. This is best seen on axial image # 47 and sagittal image # 37. The stomach, small  bowel, and large bowel are otherwise unremarkable. Appendix normal. No free intraperitoneal gas or fluid.  Vascular/Lymphatic: The abdominal vasculature is age-appropriate with moderate aortoiliac atherosclerotic calcification. No aneurysm. No pathologic adenopathy within the abdomen and pelvis.  Reproductive: Multiple enhancing masses are seen within the uterine corpus in keeping with multiple uterine fibroids. No adnexal masses are seen.  Other: The rectum is unremarkable. Tiny fat containing broad-based umbilical hernia is present.  Musculoskeletal: Degenerative changes are seen within the lumbar spine. Multiple vertebral hemangiomas are seen within the T8 and T12 vertebral bodies. No suspicious lytic or blastic bone lesions are identified  IMPRESSION: 1. Interval increase in size of the endoluminal polypoid mass involving the cecum in the region of the ileocecal junction. Given its slow interval growth since prior examination, this may represent a hyperplastic or hamartomatous polyp, however, this is not well characterized on this examination. Colonoscopy is recommended for further evaluation. 2. Cholelithiasis without evidence of acute cholecystitis. 3.  Moderate hiatal hernia. 4. Nonobstructing right nephrolithiasis. No urolithiasis. No hydronephrosis.  Aortic Atherosclerosis (ICD10-I70.0).   Electronically Signed   By: Fidela Salisbury MD   On: 04/25/2020 20:26   Assessment/Plan:  84 y.o. female with large cecal mass, complicated by pertinent comorbidities including diabetes, hypertension, bacteremia.  Patient with a large cecal mass.  Colonoscopy was done today.  Biopsy was taken.  I discussed the case with Dr. Vicente Males who reports that the mass is not resectable endoscopically.  Patient will need surgical resection of the right colon.  For this to be done I need the biopsy results for proper staging and preoperative work-up.  Otherwise there is no contraindication for oral intake.  If patient is medically stable she can be discharge and await pathology as outpatient.  If patient is still admitted I will proceed with the resection of the right colon after complete work-up is done (need to have biopsy results).   Arnold Long, MD

## 2020-04-28 NOTE — H&P (Signed)
Jonathon Bellows, MD [address], [place], [place], [place], [address] [address], [place], [place], [place], [address] Phone: [phone]  Fax: [phone]  Primary Care Physician:  Samara Deist, MD   Pre-Procedure History & Physical: HPI:  Heather Small is a 84 y.o. female is here for an colonoscopy.   Past Medical History:  Diagnosis Date  . Diabetes mellitus without complication ([place])   . Hyperlipemia   . Hypertension   . Hypothyroidism   . Thyroid disease     Past Surgical History:  Procedure Laterality Date  . COLONOSCOPY    . ESOPHAGEAL DILATION    . ESOPHAGOGASTRODUODENOSCOPY (EGD) WITH PROPOFOL N/A 07/13/2018   Procedure: ESOPHAGOGASTRODUODENOSCOPY (EGD) WITH PROPOFOL;  Surgeon: Jonathon Bellows, MD;  Location: [facility] ENDOSCOPY;  Service: Gastroenterology;  Laterality: N/A;  . ESOPHAGOGASTRODUODENOSCOPY (EGD) WITH PROPOFOL N/A 07/30/2018   Procedure: ESOPHAGOGASTRODUODENOSCOPY (EGD) WITH PROPOFOL;  Surgeon: Jonathon Bellows, MD;  Location: [facility] ENDOSCOPY;  Service: Gastroenterology;  Laterality: N/A;  . ESOPHAGOGASTRODUODENOSCOPY (EGD) WITH PROPOFOL N/A 08/18/2018   Procedure: EGD with Dilation;  Surgeon: Jonathon Bellows, MD;  Location: [facility] ENDOSCOPY;  Service: Gastroenterology;  Laterality: N/A;  . ESOPHAGOGASTRODUODENOSCOPY (EGD) WITH PROPOFOL N/A 09/29/2018   Procedure: ESOPHAGOGASTRODUODENOSCOPY (EGD) WITH PROPOFOL;  Surgeon: Jonathon Bellows, MD;  Location: [facility] ENDOSCOPY;  Service: Gastroenterology;  Laterality: N/A;  . MASTECTOMY Left     Prior to Admission medications   Medication Sig Start Date End Date Taking? Authorizing Provider  brimonidine (ALPHAGAN) 0.15 % ophthalmic solution Place 1 drop into both eyes 2 (two) times daily. 04/11/15  Yes [provider]  dorzolamide-timolol (COSOPT) 22.3-6.8 MG/ML ophthalmic solution 1 drop 2 (two) times daily.   Yes [provider]  levothyroxine (SYNTHROID) 100 MCG tablet Take 100 mcg by mouth daily before  breakfast.   Yes [provider]  losartan-hydrochlorothiazide (HYZAAR) 100-25 MG tablet losartan 100 mg-hydrochlorothiazide 25 mg tablet   Yes [provider]  LUMIGAN 0.01 % SOLN Place 1 drop into both eyes at bedtime. 04/11/15  Yes [provider]  metFORMIN (GLUCOPHAGE) 500 MG tablet Take by mouth 2 (two) times daily with a meal.   Yes [provider]  Multiple Vitamin (MULTIVITAMIN) tablet Take 1 tablet by mouth daily.   Yes [provider]  omeprazole (PRILOSEC) 40 MG capsule Take 40 mg by mouth in the morning and at bedtime.   Yes [provider]  pilocarpine (PILOCAR) 4 % ophthalmic solution Place 2 drops into both eyes 4 (four) times daily.   Yes [provider]  simvastatin (ZOCOR) 20 MG tablet Take 1 tablet by mouth at bedtime. 06/15/15  Yes [provider]  timolol (TIMOPTIC) 0.5 % ophthalmic solution Place 1 drop into both eyes 2 (two) times daily. 04/11/15  Yes [provider]  valsartan-hydrochlorothiazide (DIOVAN-HCT) 320-25 MG tablet Take 1 tablet by mouth daily. 04/13/15 01/23/20  [provider]    Allergies as of 04/25/2020 - Review Complete 04/25/2020  Allergen Reaction Noted  . Codeine Anaphylaxis and Other (See Comments) 07/23/2010    Family History  Problem Relation Age of Onset  . Diabetes Other   . Heart Problems Mother   . Cancer Father     Social History   Socioeconomic History  . Marital status: Widowed    Spouse name: Not on file  . Number of children: Not on file  . Years of education: Not on file  . Highest education level: Not on file  Occupational History  . Not  on file  Tobacco Use  . Smoking status: Never Smoker  . Smokeless tobacco: Never Used  Vaping Use  . Vaping Use: Never used  Substance and Sexual Activity  . Alcohol use: No  . Drug use: No  . Sexual activity: Not on file  Other Topics Concern  . Not on file  Social History Narrative  . Not on  file   Social Determinants of Health   Financial Resource Strain:   . Difficulty of Paying Living Expenses: Not on file  Food Insecurity:   . Worried About Charity fundraiser in the Last Year: Not on file  . Ran Out of Food in the Last Year: Not on file  Transportation Needs:   . Lack of Transportation (Medical): Not on file  . Lack of Transportation (Non-Medical): Not on file  Physical Activity:   . Days of Exercise per Week: Not on file  . Minutes of Exercise per Session: Not on file  Stress:   . Feeling of Stress : Not on file  Social Connections:   . Frequency of Communication with Friends and Family: Not on file  . Frequency of Social Gatherings with Friends and Family: Not on file  . Attends Religious Services: Not on file  . Active Member of Clubs or Organizations: Not on file  . Attends Archivist Meetings: Not on file  . Marital Status: Not on file  Intimate Partner Violence:   . Fear of Current or Ex-Partner: Not on file  . Emotionally Abused: Not on file  . Physically Abused: Not on file  . Sexually Abused: Not on file    Review of Systems: See HPI, otherwise negative ROS  Physical Exam: BP (!) 176/86   Pulse 83   Temp 97.6 F (36.4 C) (Temporal)   Resp 20   Ht 5\' 3"  (1.6 m)   Wt 66.7 kg   SpO2 96%   BMI 26.04 kg/m  General:   Alert,  pleasant and cooperative in NAD Head:  Normocephalic and atraumatic. Neck:  Supple; no masses or thyromegaly. Lungs:  Clear throughout to auscultation, normal respiratory effort.    Heart:  +S1, +S2, Regular rate and rhythm, No edema. Abdomen:  Soft, nontender and nondistended. Normal bowel sounds, without guarding, and without rebound.   Neurologic:  Alert and  oriented x4;  grossly normal neurologically.  Impression/Plan: Heather Small is here for an colonoscopy to be performed forcecal mass.Risks, benefits, limitations, and alternatives regarding  colonoscopy have been reviewed with the patient.  Questions  have been answered.  All parties agreeable.   Jonathon Bellows, MD  04/28/2020, 10:18 AM

## 2020-04-28 NOTE — Discharge Summary (Addendum)
Physician Discharge Summary  Heather Small QIO:[id number] DOB: 1935/12/07 DOA: 04/25/2020  PCP: Samara Deist, MD  Admit date: 04/25/2020 Discharge date: 04/28/2020  Admitted From: Home Disposition: Home Discharging physician: Dwyane Dee, MD  Recommendations for Outpatient Follow-up:  1. Completion of Augmentin course for E. coli bacteremia 2. Follow-up with general surgery 3. Follow-up biopsy results   Patient discharged to home in Discharge Condition: stable CODE STATUS: Full Diet recommendation:  Diet Orders (From admission, onward)    Start     Ordered   04/28/20 1325  Diet regular Room service appropriate? Yes; Fluid consistency: Thin  Diet effective now       Question Answer Comment  Room service appropriate? Yes   Fluid consistency: Thin      04/28/20 1324          Hospital Course: Ms. Heather Small is an 84 yo AA female with PMH DMII, HLD, HTN, hypothyroidism who presented to the hospital with nausea/vomiting/diarrhea and abdominal pain. She localized the abdominal pain to her lower middle abdomen. She also had developed fever and chills at home as well as weakness. In the ER she was found to be febrile, 101.2, tachycardic, tachypneic. Initial lactic acid was 6.7. WBC 13.4. Urinalysis was negative for signs of infection. COVID-19 negative. She underwent CT abdomen/pelvis which revealed an endoluminal polypoid mass involving the cecum at the ileocecal junction. It was found to be slightly increased in size compared to prior. There were also multiple enhancing masses within the uterine corpus consistent with uterine fibroids. There was also cholelithiasis but no evidence of acute cholecystitis. She also underwent a right upper quadrant ultrasound which also showed cholelithiasis but no acute cholecystitis. Initial lipase was also mildly elevated, 133; this improved with IV fluids on admission down to 37 the following morning. She was started on vancomycin, cefepime, Flagyl for  her presumed infection. Stool studies were also ordered given her ongoing diarrhea and abdominal pain.  She was negative for both C. difficile testing and on GI pathogen panel. Shortly after admission, blood cultures returned positive with gram-negative rods. This speciated out to E. Coli. Her antibiotics were transitioned to Rocephin and Flagyl.  GI was consulted regarding her cecal mass.  She underwent colonoscopy on 04/28/2020.  2 sessile polyps were found and removed.  She was also found to have a relatively large fungating mass in the cecum.  This was found to be occupying approximately two thirds of the lumen.  Biopsies were obtained and ink tattoo applied.  General surgery was then consulted.  After evaluation, patient elected for discharging home with close outpatient follow-up to discuss surgery.  She was continued on a course of Augmentin to continue treating E. coli bacteremia and will complete a 7-day course total of antibiotics from negative cultures.  Course will complete on 05/03/2020.  Patient and family were of course amenable with this plan and she was discharged home in stable condition.   Cecum mass -Endoluminal polypoid mass noted at the ileocecal junction, larger than compared to prior per radiology -Underwent colonoscopy on 04/28/2020 with GI.  Large fungating mass involving approximately 2/3 lumen diameter; biopsies obtained -General surgery evaluated afterwards.  Patient has elected for discharging home with outpatient follow-up to discuss next steps regarding surgery  Type 2 diabetes mellitus without complication ([place]) - continue SSI and CBG monitoring   Septic shock due to Escherichia coli (HCC) - febrile, tachycardic, tachypneic, WBC elevated. Lactic acid initially 6.7  - POA, E.coli severe sepsis 2/2 intra-abdominal infection 2/2 cecal  mass - was on vanc/cefepime/flagyl empirically; will de-escalate to Rocephin and flagyl at this time - repeat BC on 9/16, NGTD - obtain  stool studies for testing: C. difficile negative, GI pathogen panel negative - lactic normalized with IVF; WBC also improving  -Overall she has clinically improved.  Continue antibiotics and follow-up repeat blood cultures; will likely de-escalate to oral regimen and complete course at discharge  - change to Augmentin at discharge, complete 7 days from neg culture (end date 05/03/20, script sent to her pharmacy)  Essential hypertension -Continue losartan-HCTZ  HLD (hyperlipidemia) -Hold statin for now  Hypothyroidism -TSH 0.526, normal -Continue Synthroid  History of CVA (cerebrovascular accident) - stable and baseline  Normocytic anemia -Hemoglobin has mildly down trended since admission. Possibly dilutional with fluids versus due to severe sepsis or combo vs ?related to cecal mass (concern would be malignancy) -Continue monitoring for now; if further downtrend, will investigate further -So far remaining stable.  Follow-up biopsy results    The patient's chronic medical conditions were treated accordingly per the patient's home medication regimen except as noted.  On day of discharge, patient was felt deemed stable for discharge. Patient/family member advised to call PCP or come back to ER if needed.   Discharge Diagnoses:   Principal Diagnosis: Septic shock due to Escherichia coli [facility])  Active Hospital Problems   Diagnosis Date Noted   Septic shock due to Escherichia coli ([place]) 04/26/2020    Priority: High   Normocytic anemia 04/26/2020    Priority: Medium   Cecum mass 04/25/2020    Priority: Medium   Type 2 diabetes mellitus without complication ([place]) 06/08/2535   Essential hypertension 04/25/2020   HLD (hyperlipidemia) 04/25/2020   Hypothyroidism 04/25/2020   History of CVA (cerebrovascular accident) 07/01/2015    Resolved Hospital Problems  No resolved problems to display.    Discharge Instructions    Increase activity slowly   Complete by: As directed       Allergies as of 04/28/2020      Reactions   Codeine Anaphylaxis, Other (See Comments)   Couldn't swallow, was rushed to hospital      Medication List    TAKE these medications   amoxicillin-clavulanate 875-125 MG tablet Commonly known as: Augmentin Take 1 tablet by mouth every 12 (twelve) hours for 5 days.   brimonidine 0.15 % ophthalmic solution Commonly known as: ALPHAGAN Place 1 drop into both eyes 2 (two) times daily.   dorzolamide-timolol 22.3-6.8 MG/ML ophthalmic solution Commonly known as: COSOPT 1 drop 2 (two) times daily.   levothyroxine 100 MCG tablet Commonly known as: SYNTHROID Take 100 mcg by mouth daily before breakfast.   losartan-hydrochlorothiazide 100-25 MG tablet Commonly known as: HYZAAR losartan 100 mg-hydrochlorothiazide 25 mg tablet   Lumigan 0.01 % Soln Generic drug: bimatoprost Place 1 drop into both eyes at bedtime.   metFORMIN 500 MG tablet Commonly known as: GLUCOPHAGE Take by mouth 2 (two) times daily with a meal.   multivitamin tablet Take 1 tablet by mouth daily.   omeprazole 40 MG capsule Commonly known as: PRILOSEC Take 40 mg by mouth in the morning and at bedtime.   pilocarpine 4 % ophthalmic solution Commonly known as: PILOCAR Place 2 drops into both eyes 4 (four) times daily.   simvastatin 20 MG tablet Commonly known as: ZOCOR Take 1 tablet by mouth at bedtime.   timolol 0.5 % ophthalmic solution Commonly known as: TIMOPTIC Place 1 drop into both eyes 2 (two) times daily.  Allergies  Allergen Reactions   Codeine Anaphylaxis and Other (See Comments)    Couldn't swallow, was rushed to hospital    Consultations: GI General surgery  Discharge Exam: BP (!) 159/79 (BP Location: Right Arm)    Pulse 88    Temp 98.4 F (36.9 C) (Oral)    Resp 20    Ht 5\' 3"  (1.6 m)    Wt 66.7 kg    SpO2 97%    BMI 26.04 kg/m  General appearance: alert, cooperative, less fatigued appearing and in no distress Head:  Normocephalic, without obvious abnormality, atraumatic Eyes: EOMI Lungs: clear to auscultation bilaterally Heart: regular rate and rhythm and S1, S2 normal Abdomen: Soft, nondistended, bowel sounds present. Mild tenderness to palpation in lower middle abdomen with no rebound or guarding Extremities: No edema Skin: mobility and turgor normal Neurologic: Grossly normal  The results of significant diagnostics from this hospitalization (including imaging, microbiology, ancillary and laboratory) are listed below for reference.   Microbiology: Recent Results (from the past 240 hour(s))  Culture, blood (Routine x 2)     Status: Abnormal   Collection Time: 04/25/20  5:23 PM   Specimen: BLOOD  Result Value Ref Range Status   Specimen Description   Final    BLOOD RIGHT ANTECUBITAL Performed at [facility], [address] [place], [place] [address]    Special Requests   Final    BOTTLES DRAWN AEROBIC AND ANAEROBIC Blood Culture adequate volume Performed at [facility], [address] [place], [place] [address]    Culture  Setup Time   Final    GRAM NEGATIVE RODS ANAEROBIC BOTTLE ONLY Organism ID to follow CRITICAL RESULT CALLED TO, READ BACK BY AND VERIFIED WITH: SCOTT HALL AT 0622 04/26/20 [place] Performed at [facility], [address] [place], [place] [address]    Culture ESCHERICHIA COLI (A)  Final   Report Status 04/28/2020 FINAL  Final   Organism ID, Bacteria ESCHERICHIA COLI  Final      Susceptibility   Escherichia coli - MIC*    AMPICILLIN <=2 SENSITIVE Sensitive     CEFAZOLIN <=4 SENSITIVE Sensitive     CEFEPIME <=0.12 SENSITIVE Sensitive     CEFTAZIDIME <=1 SENSITIVE Sensitive     CEFTRIAXONE <=0.25 SENSITIVE Sensitive     CIPROFLOXACIN <=0.25 SENSITIVE Sensitive     GENTAMICIN <=1 SENSITIVE Sensitive     IMIPENEM <=0.25 SENSITIVE Sensitive     TRIMETH/SULFA <=20 SENSITIVE Sensitive     AMPICILLIN/SULBACTAM <=2 SENSITIVE Sensitive     PIP/TAZO  <=4 SENSITIVE Sensitive     * ESCHERICHIA COLI  Blood Culture ID Panel (Reflexed)     Status: Abnormal   Collection Time: 04/25/20  5:23 PM  Result Value Ref Range Status   Enterococcus faecalis NOT DETECTED NOT DETECTED Final   Enterococcus Faecium NOT DETECTED NOT DETECTED Final   Listeria monocytogenes NOT DETECTED NOT DETECTED Final   Staphylococcus species NOT DETECTED NOT DETECTED Final   Staphylococcus aureus (BCID) NOT DETECTED NOT DETECTED Final   Staphylococcus epidermidis NOT DETECTED NOT DETECTED Final   Staphylococcus lugdunensis NOT DETECTED NOT DETECTED Final   Streptococcus species NOT DETECTED NOT DETECTED Final   Streptococcus agalactiae NOT DETECTED NOT DETECTED Final   Streptococcus pneumoniae NOT DETECTED NOT DETECTED Final   Streptococcus pyogenes NOT DETECTED NOT DETECTED Final   A.calcoaceticus-baumannii NOT DETECTED NOT DETECTED Final   Bacteroides fragilis NOT DETECTED NOT DETECTED Final   Enterobacterales DETECTED (A) NOT DETECTED Final  Comment: Enterobacterales represent a large order of gram negative bacteria, not a single organism. CRITICAL RESULT CALLED TO, READ BACK BY AND VERIFIED WITH:  SCOTT HALL AT 0622 04/26/21 SDR    Enterobacter cloacae complex NOT DETECTED NOT DETECTED Final   Escherichia coli DETECTED (A) NOT DETECTED Final    Comment: CRITICAL RESULT CALLED TO, READ BACK BY AND VERIFIED WITH:  SCOTT HALL AT 0622 04/26/20 SDR    Klebsiella aerogenes NOT DETECTED NOT DETECTED Final   Klebsiella oxytoca NOT DETECTED NOT DETECTED Final   Klebsiella pneumoniae NOT DETECTED NOT DETECTED Final   Proteus species NOT DETECTED NOT DETECTED Final   Salmonella species NOT DETECTED NOT DETECTED Final   Serratia marcescens NOT DETECTED NOT DETECTED Final   Haemophilus influenzae NOT DETECTED NOT DETECTED Final   Neisseria meningitidis NOT DETECTED NOT DETECTED Final   Pseudomonas aeruginosa NOT DETECTED NOT DETECTED Final   Stenotrophomonas  maltophilia NOT DETECTED NOT DETECTED Final   Candida albicans NOT DETECTED NOT DETECTED Final   Candida auris NOT DETECTED NOT DETECTED Final   Candida glabrata NOT DETECTED NOT DETECTED Final   Candida krusei NOT DETECTED NOT DETECTED Final   Candida parapsilosis NOT DETECTED NOT DETECTED Final   Candida tropicalis NOT DETECTED NOT DETECTED Final   Cryptococcus neoformans/gattii NOT DETECTED NOT DETECTED Final   CTX-M ESBL NOT DETECTED NOT DETECTED Final   Carbapenem resistance IMP NOT DETECTED NOT DETECTED Final   Carbapenem resistance KPC NOT DETECTED NOT DETECTED Final   Carbapenem resistance NDM NOT DETECTED NOT DETECTED Final   Carbapenem resist OXA 48 LIKE NOT DETECTED NOT DETECTED Final   Carbapenem resistance VIM NOT DETECTED NOT DETECTED Final    Comment: Performed at [facility], [address] [place], [place] [address]  Culture, blood (Routine x 2)     Status: Abnormal   Collection Time: 04/25/20  5:55 PM   Specimen: BLOOD  Result Value Ref Range Status   Specimen Description   Final    BLOOD BLOOD LEFT FOREARM Performed at [facility], [address], [place], [place] [address]    Special Requests   Final    BOTTLES DRAWN AEROBIC AND ANAEROBIC Blood Culture adequate volume Performed at [facility], [address] [place], [place] [address]    Culture  Setup Time   Final    GRAM NEGATIVE RODS AEROBIC BOTTLE ONLY CRITICAL VALUE NOTED.  VALUE IS CONSISTENT WITH PREVIOUSLY REPORTED AND CALLED VALUE. Performed at [facility], [address] [place], [place] [address]    Culture (A)  Final    ESCHERICHIA COLI SUSCEPTIBILITIES PERFORMED ON PREVIOUS CULTURE WITHIN THE LAST 5 DAYS. Performed at [facility], [place] [address], [place], [place] [address]    Report Status 04/28/2020 FINAL  Final  SARS Coronavirus 2 by RT PCR (hospital order, performed in [facility] hospital lab) Nasopharyngeal Nasopharyngeal Swab      Status: None   Collection Time: 04/25/20  5:55 PM   Specimen: Nasopharyngeal Swab  Result Value Ref Range Status   SARS Coronavirus 2 NEGATIVE NEGATIVE Final    Comment: (NOTE) SARS-CoV-2 target nucleic acids are NOT DETECTED.  The SARS-CoV-2 RNA is generally detectable in upper and lower respiratory specimens during the acute phase of infection. The lowest concentration of SARS-CoV-2 viral copies this assay can detect is 250 copies / mL. A negative result does not preclude SARS-CoV-2 infection and should not be used as the sole basis for treatment or other  patient management decisions.  A negative result may occur with improper specimen collection / handling, submission of specimen other than nasopharyngeal swab, presence of viral mutation(s) within the areas targeted by this assay, and inadequate number of viral copies (<250 copies / mL). A negative result must be combined with clinical observations, patient history, and epidemiological information.  Fact Sheet for Patients:   StrictlyIdeas.no  Fact Sheet for Healthcare Providers: BankingDealers.co.za  This test is not yet approved or  cleared by the [place] FDA and has been authorized for detection and/or diagnosis of SARS-CoV-2 by FDA under an Emergency Use Authorization (EUA).  This EUA will remain in effect (meaning this test can be used) for the duration of the COVID-19 declaration under Section 564(b)(1) of the Act, 21 U.S.C. section 360bbb-3(b)(1), unless the authorization is terminated or revoked sooner.  Performed at [facility], [address], [place], [place] [address]   Urine culture     Status: Abnormal   Collection Time: 04/25/20  6:26 PM   Specimen: In/Out Cath Urine  Result Value Ref Range Status   Specimen Description   Final    IN/OUT CATH URINE Performed at [facility], [address] [place], [place] [address]    Special  Requests   Final    NONE Performed at [facility], [address] [place], [place] [address]    Culture MULTIPLE SPECIES PRESENT, SUGGEST RECOLLECTION (A)  Final   Report Status 04/26/2020 FINAL  Final  CULTURE, BLOOD (ROUTINE X 2) w Reflex to ID Panel     Status: None (Preliminary result)   Collection Time: 04/27/20  9:29 AM   Specimen: BLOOD  Result Value Ref Range Status   Specimen Description BLOOD RIGHT HAND  Final   Special Requests   Final    BOTTLES DRAWN AEROBIC AND ANAEROBIC Blood Culture adequate volume   Culture   Final    NO GROWTH < 24 HOURS Performed at [facility], [address] [place], [place] [address]    Report Status PENDING  Incomplete  CULTURE, BLOOD (ROUTINE X 2) w Reflex to ID Panel     Status: None (Preliminary result)   Collection Time: 04/27/20  9:36 AM   Specimen: BLOOD  Result Value Ref Range Status   Specimen Description BLOOD RIGHT ASSIST CONTROL  Final   Special Requests   Final    BOTTLES DRAWN AEROBIC AND ANAEROBIC Blood Culture adequate volume   Culture   Final    NO GROWTH < 24 HOURS Performed at [facility], [address], [place], [place] [address]    Report Status PENDING  Incomplete  C Difficile Quick Screen w PCR reflex     Status: None   Collection Time: 04/27/20 11:33 AM   Specimen: STOOL  Result Value Ref Range Status   C Diff antigen NEGATIVE NEGATIVE Final   C Diff toxin NEGATIVE NEGATIVE Final   C Diff interpretation No C. difficile detected.  Final    Comment: Performed at [facility], [address] [place], [place] [address]  Gastrointestinal Panel by PCR , Stool     Status: None   Collection Time: 04/27/20 11:33 AM   Specimen: STOOL  Result Value Ref Range Status   Campylobacter species NOT DETECTED NOT DETECTED Final   Plesimonas shigelloides NOT DETECTED NOT DETECTED Final   Salmonella species NOT DETECTED NOT DETECTED Final   Yersinia enterocolitica NOT DETECTED NOT  DETECTED Final   Vibrio species  NOT DETECTED NOT DETECTED Final   Vibrio cholerae NOT DETECTED NOT DETECTED Final   Enteroaggregative E coli (EAEC) NOT DETECTED NOT DETECTED Final   Enteropathogenic E coli (EPEC) NOT DETECTED NOT DETECTED Final   Enterotoxigenic E coli (ETEC) NOT DETECTED NOT DETECTED Final   Shiga like toxin producing E coli (STEC) NOT DETECTED NOT DETECTED Final   Shigella/Enteroinvasive E coli (EIEC) NOT DETECTED NOT DETECTED Final   Cryptosporidium NOT DETECTED NOT DETECTED Final   Cyclospora cayetanensis NOT DETECTED NOT DETECTED Final   Entamoeba histolytica NOT DETECTED NOT DETECTED Final   Giardia lamblia NOT DETECTED NOT DETECTED Final   Adenovirus F40/41 NOT DETECTED NOT DETECTED Final   Astrovirus NOT DETECTED NOT DETECTED Final   Norovirus GI/GII NOT DETECTED NOT DETECTED Final   Rotavirus A NOT DETECTED NOT DETECTED Final   Sapovirus (I, II, IV, and V) NOT DETECTED NOT DETECTED Final    Comment: Performed at [facility], [address] [place], [place] [address]     Labs: BNP (last 3 results) No results for input(s): BNP in the last 8760 hours. Basic Metabolic Panel: Recent Labs  Lab 04/25/20 1723 04/26/20 0413 04/27/20 0642 04/28/20 0705  NA 134* 138 138 138  K 4.0 3.1* 3.2* 3.0*  CL 99 103 102 101  CO2 24 28 30 29   GLUCOSE 171* 139* 130* 126*  BUN 14 10 8  5*  CREATININE 0.80 0.70 0.66 0.53  CALCIUM 9.4 8.3* 8.4* 8.6*  MG  --  1.6* 2.0 2.0   Liver Function Tests: Recent Labs  Lab 04/25/20 1723 04/26/20 0413 04/27/20 0642 04/28/20 0705  AST 89* 97* 61* 45*  ALT 36 57* 53* 48*  ALKPHOS 88 74 79 91  BILITOT 0.9 0.7 0.5 0.5  PROT 8.3* 6.5 6.2* 6.9  ALBUMIN 3.9 2.9* 2.7* 3.0*   Recent Labs  Lab 04/25/20 1723 04/26/20 0413  LIPASE 133* 37   No results for input(s): AMMONIA in the last 168 hours. CBC: Recent Labs  Lab 04/25/20 1723 04/26/20 0413 04/27/20 0642 04/28/20 0705  WBC 13.4* 11.2* 7.5 5.8  NEUTROABS  12.2*  --  5.1 3.5  HGB 12.0 9.8* 9.5* 10.8*  HCT 36.9 30.2* 29.7* 32.8*  MCV 84.4 86.3 87.1 86.3  PLT 328 254 247 281   Cardiac Enzymes: No results for input(s): CKTOTAL, CKMB, CKMBINDEX, TROPONINI in the last 168 hours. BNP: Invalid input(s): POCBNP CBG: Recent Labs  Lab 04/27/20 1623 04/27/20 2131 04/28/20 0727 04/28/20 1232 04/28/20 1627  GLUCAP 152* 178* 125* 130* 169*   D-Dimer No results for input(s): DDIMER in the last 72 hours. Hgb A1c Recent Labs    04/25/20 1723  HGBA1C 8.5*   Lipid Profile No results for input(s): CHOL, HDL, LDLCALC, TRIG, CHOLHDL, LDLDIRECT in the last 72 hours. Thyroid function studies Recent Labs    04/25/20 1723  TSH 0.526   Anemia work up No results for input(s): VITAMINB12, FOLATE, FERRITIN, TIBC, IRON, RETICCTPCT in the last 72 hours. Urinalysis    Component Value Date/Time   COLORURINE YELLOW (A) 04/25/2020 1826   APPEARANCEUR CLEAR (A) 04/25/2020 1826   LABSPEC 1.014 04/25/2020 1826   PHURINE 8.0 04/25/2020 1826   GLUCOSEU NEGATIVE 04/25/2020 1826   HGBUR NEGATIVE 04/25/2020 1826   BILIRUBINUR NEGATIVE 04/25/2020 1826   KETONESUR NEGATIVE 04/25/2020 1826   PROTEINUR NEGATIVE 04/25/2020 1826   NITRITE NEGATIVE 04/25/2020 1826   LEUKOCYTESUR NEGATIVE 04/25/2020 1826   Sepsis Labs Invalid input(s): PROCALCITONIN,  WBC,  LACTICIDVEN Microbiology Recent  Results (from the past 240 hour(s))  Culture, blood (Routine x 2)     Status: Abnormal   Collection Time: 04/25/20  5:23 PM   Specimen: BLOOD  Result Value Ref Range Status   Specimen Description   Final    BLOOD RIGHT ANTECUBITAL Performed at [facility], [place] [address], [place], [place] [address]    Special Requests   Final    BOTTLES DRAWN AEROBIC AND ANAEROBIC Blood Culture adequate volume Performed at [facility], [address] [place], [place] [address]    Culture  Setup Time   Final    GRAM NEGATIVE RODS ANAEROBIC BOTTLE ONLY Organism ID  to follow CRITICAL RESULT CALLED TO, READ BACK BY AND VERIFIED WITH: SCOTT HALL AT 0622 04/26/20 [place] Performed at [facility], [address] [place],  [address]    Culture ESCHERICHIA COLI (A)  Final   Report Status 04/28/2020 FINAL  Final   Organism ID, Bacteria ESCHERICHIA COLI  Final      Susceptibility   Escherichia coli - MIC*    AMPICILLIN <=2 SENSITIVE Sensitive     CEFAZOLIN <=4 SENSITIVE Sensitive     CEFEPIME <=0.12 SENSITIVE Sensitive     CEFTAZIDIME <=1 SENSITIVE Sensitive     CEFTRIAXONE <=0.25 SENSITIVE Sensitive     CIPROFLOXACIN <=0.25 SENSITIVE Sensitive     GENTAMICIN <=1 SENSITIVE Sensitive     IMIPENEM <=0.25 SENSITIVE Sensitive     TRIMETH/SULFA <=20 SENSITIVE Sensitive     AMPICILLIN/SULBACTAM <=2 SENSITIVE Sensitive     PIP/TAZO <=4 SENSITIVE Sensitive     * ESCHERICHIA COLI  Blood Culture ID Panel (Reflexed)     Status: Abnormal   Collection Time: 04/25/20  5:23 PM  Result Value Ref Range Status   Enterococcus faecalis NOT DETECTED NOT DETECTED Final   Enterococcus Faecium NOT DETECTED NOT DETECTED Final   Listeria monocytogenes NOT DETECTED NOT DETECTED Final   Staphylococcus species NOT DETECTED NOT DETECTED Final   Staphylococcus aureus (BCID) NOT DETECTED NOT DETECTED Final   Staphylococcus epidermidis NOT DETECTED NOT DETECTED Final   Staphylococcus lugdunensis NOT DETECTED NOT DETECTED Final   Streptococcus species NOT DETECTED NOT DETECTED Final   Streptococcus agalactiae NOT DETECTED NOT DETECTED Final   Streptococcus pneumoniae NOT DETECTED NOT DETECTED Final   Streptococcus pyogenes NOT DETECTED NOT DETECTED Final   A.calcoaceticus-baumannii NOT DETECTED NOT DETECTED Final   Bacteroides fragilis NOT DETECTED NOT DETECTED Final   Enterobacterales DETECTED (A) NOT DETECTED Final    Comment: Enterobacterales represent a large order of gram negative bacteria, not a single organism. CRITICAL RESULT CALLED TO, READ BACK BY AND  VERIFIED WITH:  SCOTT HALL AT 3329 04/26/21 SDR    Enterobacter cloacae complex NOT DETECTED NOT DETECTED Final   Escherichia coli DETECTED (A) NOT DETECTED Final    Comment: CRITICAL RESULT CALLED TO, READ BACK BY AND VERIFIED WITH:  SCOTT HALL AT 0622 04/26/20 SDR    Klebsiella aerogenes NOT DETECTED NOT DETECTED Final   Klebsiella oxytoca NOT DETECTED NOT DETECTED Final   Klebsiella pneumoniae NOT DETECTED NOT DETECTED Final   Proteus species NOT DETECTED NOT DETECTED Final   Salmonella species NOT DETECTED NOT DETECTED Final   Serratia marcescens NOT DETECTED NOT DETECTED Final   Haemophilus influenzae NOT DETECTED NOT DETECTED Final   Neisseria meningitidis NOT DETECTED NOT DETECTED Final   Pseudomonas aeruginosa NOT DETECTED NOT DETECTED Final   Stenotrophomonas maltophilia NOT DETECTED NOT DETECTED Final   Candida albicans  NOT DETECTED NOT DETECTED Final   Candida auris NOT DETECTED NOT DETECTED Final   Candida glabrata NOT DETECTED NOT DETECTED Final   Candida krusei NOT DETECTED NOT DETECTED Final   Candida parapsilosis NOT DETECTED NOT DETECTED Final   Candida tropicalis NOT DETECTED NOT DETECTED Final   Cryptococcus neoformans/gattii NOT DETECTED NOT DETECTED Final   CTX-M ESBL NOT DETECTED NOT DETECTED Final   Carbapenem resistance IMP NOT DETECTED NOT DETECTED Final   Carbapenem resistance KPC NOT DETECTED NOT DETECTED Final   Carbapenem resistance NDM NOT DETECTED NOT DETECTED Final   Carbapenem resist OXA 48 LIKE NOT DETECTED NOT DETECTED Final   Carbapenem resistance VIM NOT DETECTED NOT DETECTED Final    Comment: Performed at [facility], [address] [place], [place] [address]  Culture, blood (Routine x 2)     Status: Abnormal   Collection Time: 04/25/20  5:55 PM   Specimen: BLOOD  Result Value Ref Range Status   Specimen Description   Final    BLOOD BLOOD LEFT FOREARM Performed at [facility], [address], [place], [place] [address]     Special Requests   Final    BOTTLES DRAWN AEROBIC AND ANAEROBIC Blood Culture adequate volume Performed at [facility], [address] [place], [place] [address]    Culture  Setup Time   Final    GRAM NEGATIVE RODS AEROBIC BOTTLE ONLY CRITICAL VALUE NOTED.  VALUE IS CONSISTENT WITH PREVIOUSLY REPORTED AND CALLED VALUE. Performed at [facility], [address] [place], [place] [address]    Culture (A)  Final    ESCHERICHIA COLI SUSCEPTIBILITIES PERFORMED ON PREVIOUS CULTURE WITHIN THE LAST 5 DAYS. Performed at [facility], [address], [place], [place] [address]    Report Status 04/28/2020 FINAL  Final  SARS Coronavirus 2 by RT PCR (hospital order, performed in [facility] hospital lab) Nasopharyngeal Nasopharyngeal Swab     Status: None   Collection Time: 04/25/20  5:55 PM   Specimen: Nasopharyngeal Swab  Result Value Ref Range Status   SARS Coronavirus 2 NEGATIVE NEGATIVE Final    Comment: (NOTE) SARS-CoV-2 target nucleic acids are NOT DETECTED.  The SARS-CoV-2 RNA is generally detectable in upper and lower respiratory specimens during the acute phase of infection. The lowest concentration of SARS-CoV-2 viral copies this assay can detect is 250 copies / mL. A negative result does not preclude SARS-CoV-2 infection and should not be used as the sole basis for treatment or other patient management decisions.  A negative result may occur with improper specimen collection / handling, submission of specimen other than nasopharyngeal swab, presence of viral mutation(s) within the areas targeted by this assay, and inadequate number of viral copies (<250 copies / mL). A negative result must be combined with clinical observations, patient history, and epidemiological information.  Fact Sheet for Patients:   StrictlyIdeas.no  Fact Sheet for Healthcare Providers: BankingDealers.co.za  This test is not  yet approved or  cleared by the [place] FDA and has been authorized for detection and/or diagnosis of SARS-CoV-2 by FDA under an Emergency Use Authorization (EUA).  This EUA will remain in effect (meaning this test can be used) for the duration of the COVID-19 declaration under Section 564(b)(1) of the Act, 21 U.S.C. section 360bbb-3(b)(1), unless the authorization is terminated or revoked sooner.  Performed at [facility], [address], [place], [place] [address]   Urine culture     Status: Abnormal  Collection Time: 04/25/20  6:26 PM   Specimen: In/Out Cath Urine  Result Value Ref Range Status   Specimen Description   Final    IN/OUT CATH URINE Performed at [facility], [address] [place], [place] [address]    Special Requests   Final    NONE Performed at [facility], [address] [place], [place] [address]    Culture MULTIPLE SPECIES PRESENT, SUGGEST RECOLLECTION (A)  Final   Report Status 04/26/2020 FINAL  Final  CULTURE, BLOOD (ROUTINE X 2) w Reflex to ID Panel     Status: None (Preliminary result)   Collection Time: 04/27/20  9:29 AM   Specimen: BLOOD  Result Value Ref Range Status   Specimen Description BLOOD RIGHT HAND  Final   Special Requests   Final    BOTTLES DRAWN AEROBIC AND ANAEROBIC Blood Culture adequate volume   Culture   Final    NO GROWTH < 24 HOURS Performed at [facility], [address], [place], [place] [address]    Report Status PENDING  Incomplete  CULTURE, BLOOD (ROUTINE X 2) w Reflex to ID Panel     Status: None (Preliminary result)   Collection Time: 04/27/20  9:36 AM   Specimen: BLOOD  Result Value Ref Range Status   Specimen Description BLOOD RIGHT ASSIST CONTROL  Final   Special Requests   Final    BOTTLES DRAWN AEROBIC AND ANAEROBIC Blood Culture adequate volume   Culture   Final    NO GROWTH < 24 HOURS Performed at [facility], [address] [place], [place]  [address]    Report Status PENDING  Incomplete  C Difficile Quick Screen w PCR reflex     Status: None   Collection Time: 04/27/20 11:33 AM   Specimen: STOOL  Result Value Ref Range Status   C Diff antigen NEGATIVE NEGATIVE Final   C Diff toxin NEGATIVE NEGATIVE Final   C Diff interpretation No C. difficile detected.  Final    Comment: Performed at [facility], [address] [place], [place] [address]  Gastrointestinal Panel by PCR , Stool     Status: None   Collection Time: 04/27/20 11:33 AM   Specimen: STOOL  Result Value Ref Range Status   Campylobacter species NOT DETECTED NOT DETECTED Final   Plesimonas shigelloides NOT DETECTED NOT DETECTED Final   Salmonella species NOT DETECTED NOT DETECTED Final   Yersinia enterocolitica NOT DETECTED NOT DETECTED Final   Vibrio species NOT DETECTED NOT DETECTED Final   Vibrio cholerae NOT DETECTED NOT DETECTED Final   Enteroaggregative E coli (EAEC) NOT DETECTED NOT DETECTED Final   Enteropathogenic E coli (EPEC) NOT DETECTED NOT DETECTED Final   Enterotoxigenic E coli (ETEC) NOT DETECTED NOT DETECTED Final   Shiga like toxin producing E coli (STEC) NOT DETECTED NOT DETECTED Final   Shigella/Enteroinvasive E coli (EIEC) NOT DETECTED NOT DETECTED Final   Cryptosporidium NOT DETECTED NOT DETECTED Final   Cyclospora cayetanensis NOT DETECTED NOT DETECTED Final   Entamoeba histolytica NOT DETECTED NOT DETECTED Final   Giardia lamblia NOT DETECTED NOT DETECTED Final   Adenovirus F40/41 NOT DETECTED NOT DETECTED Final   Astrovirus NOT DETECTED NOT DETECTED Final   Norovirus GI/GII NOT DETECTED NOT DETECTED Final   Rotavirus A NOT DETECTED NOT DETECTED Final   Sapovirus (I, II, IV, and V) NOT DETECTED NOT DETECTED Final    Comment: Performed at [facility], [address] [place],  [place] [address]    Procedures/Studies: CT HEAD WO CONTRAST  Result Date: 04/25/2020 CLINICAL DATA:  Transient ischemic attack (TIA);  slurred speech. EXAM: CT HEAD WITHOUT CONTRAST TECHNIQUE: Contiguous axial images were obtained from the base of the skull through the vertex without intravenous contrast. COMPARISON:  Brain MRI 07/02/2015. head CT 07/01/2015. FINDINGS: Brain: Cerebral volume is normal for age. Stable, mild ill-defined hypoattenuation within the cerebral white matter which is nonspecific, but consistent with chronic small vessel ischemic disease. There is no acute intracranial hemorrhage. No demarcated cortical infarct. No extra-axial fluid collection. No evidence of intracranial mass. No midline shift. Vascular: No hyperdense vessel.  Atherosclerotic calcifications Skull: Normal. Negative for fracture or focal lesion. Sinuses/Orbits: Visualized orbits show no acute finding. Prior right lens replacement. Mild ethmoid sinus mucosal thickening. No significant mastoid effusion. IMPRESSION: No CT evidence of acute intracranial abnormality. Stable, mild cerebral white matter chronic small vessel ischemic disease. Mild ethmoid sinus mucosal thickening. Electronically Signed   By: Kellie Simmering DO   On: 04/25/2020 17:53   CT ABDOMEN PELVIS W CONTRAST  Result Date: 04/25/2020 CLINICAL DATA:  Nausea, vomiting, diarrhea, epigastric abdominal pain EXAM: CT ABDOMEN AND PELVIS WITH CONTRAST TECHNIQUE: Multidetector CT imaging of the abdomen and pelvis was performed using the standard protocol following bolus administration of intravenous contrast. CONTRAST:  135mL OMNIPAQUE IOHEXOL 300 MG/ML  SOLN COMPARISON:  02/17/2017 FINDINGS: Lower chest: A peripheral nodular opacity is seen within the right lung base posteriorly, not fully characterized on this examination and indeterminate. This region was not fully included on prior examination. Cardiac size within normal limits. No pericardial effusion. Moderate hiatal hernia present. Hepatobiliary: Cholelithiasis is present without pericholecystic inflammatory change identified. Liver unremarkable.  No intra or extrahepatic biliary ductal dilation. Pancreas: Unremarkable Spleen: Unremarkable Adrenals/Urinary Tract: The adrenal glands are unremarkable. The kidneys are normal in size and position. There is asymmetric cortical calcification and cortical scarring involving the interpolar region of the right kidney. A stable 7 mm calculus is seen contained within an upper pole probable caliceal diverticulum. At least 1 2 mm nonobstructing renal calculus is identified within the right kidney. No additional renal or ureteral calculi are identified. No hydronephrosis. Multiple simple cortical cysts are seen within the right kidney. The bladder is unremarkable. Stomach/Bowel: There is lobular eccentric wall thickening and a polypoid mass involving the cecum in the region of the ileocecal junction. This has enlarged since prior examination. This is best seen on axial image # 47 and sagittal image # 37. The stomach, small bowel, and large bowel are otherwise unremarkable. Appendix normal. No free intraperitoneal gas or fluid. Vascular/Lymphatic: The abdominal vasculature is age-appropriate with moderate aortoiliac atherosclerotic calcification. No aneurysm. No pathologic adenopathy within the abdomen and pelvis. Reproductive: Multiple enhancing masses are seen within the uterine corpus in keeping with multiple uterine fibroids. No adnexal masses are seen. Other: The rectum is unremarkable. Tiny fat containing broad-based umbilical hernia is present. Musculoskeletal: Degenerative changes are seen within the lumbar spine. Multiple vertebral hemangiomas are seen within the T8 and T12 vertebral bodies. No suspicious lytic or blastic bone lesions are identified IMPRESSION: 1. Interval increase in size of the endoluminal polypoid mass involving the cecum in the region of the ileocecal junction. Given its slow interval growth since prior examination, this may represent a hyperplastic or hamartomatous polyp, however, this is not  well characterized on this examination. Colonoscopy is recommended for further evaluation. 2. Cholelithiasis without evidence of acute cholecystitis. 3. Moderate hiatal hernia. 4. Nonobstructing right nephrolithiasis.  No urolithiasis. No hydronephrosis. Aortic Atherosclerosis (ICD10-I70.0). Electronically Signed   By: Fidela Salisbury MD   On: 04/25/2020 20:26   DG Chest Port 1 View  Result Date: 04/25/2020 CLINICAL DATA:  Fever, hypertension EXAM: PORTABLE CHEST 1 VIEW COMPARISON:  11/30/2016 FINDINGS: Lung volumes are small and there is left basilar atelectasis present. 21 mm nodule within the right lung base is stable since prior examination. No superimposed confluent pulmonary infiltrate. No pneumothorax or pleural effusion. Cardiac size is within normal limits. No acute bone abnormality. IMPRESSION: 1. Small lung volumes with left basilar atelectasis. 2. Stable 21 mm nodule within the right lung base. . Electronically Signed   By: Fidela Salisbury MD   On: 04/25/2020 17:51   US ABDOMEN LIMITED RUQ  Result Date: 04/26/2020 CLINICAL DATA:  Elevated lipase. EXAM: ULTRASOUND ABDOMEN LIMITED RIGHT UPPER QUADRANT COMPARISON:  None. FINDINGS: Gallbladder: Multiple subcentimeter shadowing echogenic gallstones are seen within the gallbladder lumen (the largest measures approximately 3.9 mm). There is no evidence of gallbladder wall thickening (1.8 mm). No sonographic Murphy sign noted by sonographer. Common bile duct: Diameter: 4.7 mm Liver: No focal lesion identified. Within normal limits in parenchymal echogenicity. Portal vein is patent on color Doppler imaging with normal direction of blood flow towards the liver. Other: None. IMPRESSION: Cholelithiasis without evidence of acute cholecystitis. Electronically Signed   By: Virgina Norfolk M.D.   On: 04/26/2020 01:28     Time coordinating discharge: Over 30 minutes    Dwyane Dee, MD  [facility] 04/28/2020, 5:00 PM Pager: Secure chat  If  7PM-7AM, please contact night-coverage [email or web address] Password [email or web address]

## 2020-04-28 NOTE — Op Note (Signed)
[facility] Gastroenterology Patient Name: Heather Small Procedure Date: 04/28/2020 10:32 AM MRN: [id number] Account #: [id number] Date of Birth: 1935-11-25 Admit Type: Inpatient Age: 84 Room: [facility] ENDO ROOM 4 Gender: Female Note Status: Finalized Procedure:             Colonoscopy Indications:           Abnormal CT of the GI tract Providers:             Jonathon Bellows MD, MD Referring MD:          No Local Md, MD (Referring MD) Medicines:             Monitored Anesthesia Care Complications:         No immediate complications. Procedure:             Pre-Anesthesia Assessment:                        - Prior to the procedure, a History and Physical was                         performed, and patient medications, allergies and                         sensitivities were reviewed. The patient's tolerance                         of previous anesthesia was reviewed.                        - The risks and benefits of the procedure and the                         sedation options and risks were discussed with the                         patient. All questions were answered and informed                         consent was obtained.                        - ASA Grade Assessment: III - A patient with severe                         systemic disease.                        After obtaining informed consent, the colonoscope was                         passed under direct vision. Throughout the procedure,                         the patient's blood pressure, pulse, and oxygen                         saturations were monitored continuously. The                         Colonoscope was introduced through the  anus and                         advanced to the the cecum, identified by the                         appendiceal orifice. The colonoscopy was performed                         with ease. The patient tolerated the procedure well.                         The quality of the bowel  preparation was poor. Findings:      The perianal and digital rectal examinations were normal.      Two sessile polyps were found in the sigmoid colon. The polyps were 5 to       7 mm in size. These polyps were removed with a cold snare. Resection and       retrieval were complete.      A fungating non-obstructing large mass was found in the cecum. The mass       was partially circumferential (involving two-thirds of the lumen       circumference). The mass occupied almost the entire half of teh cecum       and extending till the ascending colon border. [Diameter]. [Bleeding].       Biopsies were taken with a cold forceps for histology. Area was tattooed       with an injection of Spot (carbon black). tatoo placed at distal to the       mass in the ascending colon      The exam was otherwise without abnormality on direct and retroflexion       views. Impression:            - Preparation of the colon was poor.                        - Two 5 to 7 mm polyps in the sigmoid colon, removed                         with a cold snare. Resected and retrieved.                        - Rule out malignancy, tumor in the cecum. Biopsied.                         Tattooed.                        - The examination was otherwise normal on direct and                         retroflexion views. Recommendation:        - Return patient to hospital ward for ongoing care.                        - Advance diet as tolerated.                        - Continue present medications.                        -  Await pathology results.                        - Consult surgery for discussing surgical options.                         This mass cannot be removed endoscopically                        - Prep inadequate to detect polyps < 7 mm Procedure Code(s):     --- Professional ---                        [phone], Colonoscopy, flexible; with removal of                         tumor(s), polyp(s), or other lesion(s) by snare                          technique                        45381, Colonoscopy, flexible; with directed submucosal                         injection(s), any substance                        45380, 59, Colonoscopy, flexible; with biopsy, single                         or multiple Diagnosis Code(s):     --- Professional ---                        K63.5, Polyp of colon                        D49.0, Neoplasm of unspecified behavior of digestive                         system                        R93.3, Abnormal findings on diagnostic imaging of                         other parts of digestive tract CPT copyright 2019 American Medical Association. All rights reserved. The codes documented in this report are preliminary and upon coder review may  be revised to meet current compliance requirements. Jonathon Bellows, MD Jonathon Bellows MD, MD 04/28/2020 10:57:46 AM This report has been signed electronically. Number of Addenda: 0 Note Initiated On: 04/28/2020 10:32 AM Scope Withdrawal Time: 0 hours 9 minutes 2 seconds  Total Procedure Duration: 0 hours 13 minutes 53 seconds  Estimated Blood Loss:  Estimated blood loss: none.      [facility]

## 2020-05-01 ENCOUNTER — Encounter: Payer: Self-pay | Admitting: Podiatry

## 2020-05-01 ENCOUNTER — Ambulatory Visit: Payer: Self-pay | Admitting: General Surgery

## 2020-05-01 ENCOUNTER — Telehealth: Payer: Self-pay | Admitting: General Surgery

## 2020-05-01 LAB — SURGICAL PATHOLOGY

## 2020-05-01 NOTE — Telephone Encounter (Signed)
I personally call the patient today to discuss the biopsy results from the colonoscopy.  I was able to discuss the results with the daughter Mardene Celeste.  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  I explained to her what this means.  Basically I told her that at this moment there is no diagnosis of colon cancer but these polyps will eventually progress to cancer.  I also told the daughter that other reasons why surgical management is recommended is because of 1 day whole polyp is removed with the resection of the colon sometimes this polyps can get upgraded to colon cancer due to new findings of invasive adenocarcinoma foci within the polyp.  The daughter told me that the patient has not decided yet if she want to proceed with surgery and she wants to get a second opinion.  I gave my office phone number to the patient's daughter to contact our office if she decides to proceed with surgical management.  I will see my patient in the office for further discussion of the surgical alternative and any other question that they may have.  Herbert Pun, MD

## 2020-05-01 NOTE — Telephone Encounter (Signed)
Caryl Pina inform patient    "COLON MASS, CECUM; COLD BIOPSY:  - TUBULOVILLOUS ADENOMA WITH HIGH GRADE DYSPLASIA. "   Inform the patient that the growth that we saw in the cecum -  biopsies shows that it is a polyp which is 1 step away from the cancer.  It was a very large polyp and we have only taken a few biopsies and this report represent only the results of the biopsy.  I believe the next step is to undergo surgery and have this polyp completely taken out by the surgeon.  Dr. Peyton Najjar has already spoken to her at the hospital about it and she should follow up with him to determine the timing of surgery.  C/c Dr. Peyton Najjar  Dr Jonathon Bellows MD,MRCP [facility]) Gastroenterology/Hepatology Pager: [phone]

## 2020-05-01 NOTE — Progress Notes (Signed)
Dr Ferrel Logan- below results of the biopsy- I believe you are planning surgery - if you need anything from my end please do let me know   Navil Kole

## 2020-05-02 LAB — CULTURE, BLOOD (ROUTINE X 2)
Culture: NO GROWTH
Culture: NO GROWTH
Special Requests: ADEQUATE
Special Requests: ADEQUATE

## 2020-05-08 ENCOUNTER — Encounter: Payer: Self-pay | Admitting: Gastroenterology

## 2020-05-16 ENCOUNTER — Ambulatory Visit: Payer: Medicare PPO | Admitting: Podiatry

## 2020-05-18 ENCOUNTER — Other Ambulatory Visit: Payer: Self-pay

## 2020-05-18 ENCOUNTER — Ambulatory Visit (INDEPENDENT_AMBULATORY_CARE_PROVIDER_SITE_OTHER): Payer: Medicare PPO | Admitting: Podiatry

## 2020-05-18 ENCOUNTER — Encounter: Payer: Self-pay | Admitting: Podiatry

## 2020-05-18 DIAGNOSIS — M79675 Pain in left toe(s): Secondary | ICD-10-CM | POA: Diagnosis not present

## 2020-05-18 DIAGNOSIS — B351 Tinea unguium: Secondary | ICD-10-CM | POA: Diagnosis not present

## 2020-05-18 DIAGNOSIS — M79674 Pain in right toe(s): Secondary | ICD-10-CM

## 2020-05-18 DIAGNOSIS — E1142 Type 2 diabetes mellitus with diabetic polyneuropathy: Secondary | ICD-10-CM | POA: Diagnosis not present

## 2020-05-18 NOTE — Progress Notes (Signed)
  Subjective:  Patient ID: Heather Small, female    DOB: 03/13/1936,  MRN: [id number]  Chief Complaint  Patient presents with  . Nail Problem    nail trim and DFC  . Diabetes   84 y.o. female returns for the above complaint.  Patient presents with thickened elongated dystrophic digits atrophic discolored nails x10.  They are mild pain on palpation.  Patient would like to have them debrided down.  She denies any other acute complaints.  Her last A1c was 8.9.  She is a type II diabetic  Objective:  There were no vitals filed for this visit. Podiatric Exam: Vascular: dorsalis pedis and posterior tibial pulses are palpable bilateral. Capillary return is immediate. Temperature gradient is WNL. Skin turgor WNL  Sensorium: Normal Semmes Weinstein monofilament test. Normal tactile sensation bilaterally. Nail Exam: Pt has thick disfigured discolored nails with subungual debris noted bilateral entire nail hallux through fifth toenails.  Pain on palpation to the nails. Ulcer Exam: There is no evidence of ulcer or pre-ulcerative changes or infection. Orthopedic Exam: Muscle tone and strength are WNL. No limitations in general ROM. No crepitus or effusions noted. HAV  B/L.  Hammer toes 2-5  B/L. Skin: No Porokeratosis. No infection or ulcers.  Bilateral 1+ pitting edema generalized circumferential around the ankle.  No ulcers noted.    Assessment & Plan:   1. Type 2 diabetes mellitus with diabetic polyneuropathy, without long-term current use of insulin (HCC)   2. Pain due to onychomycosis of toenails of both feet     Patient was evaluated and treated and all questions answered.  Bilateral ankle swelling -Clinically improving  Onychomycosis with pain  -Nails palliatively debrided as below. -Educated on self-care  Procedure: Nail Debridement Rationale: pain  Type of Debridement: manual, sharp debridement. Instrumentation: Nail nipper, rotary burr. Number of Nails: 10  Procedures and  Treatment: Consent by patient was obtained for treatment procedures. The patient understood the discussion of treatment and procedures well. All questions were answered thoroughly reviewed. Debridement of mycotic and hypertrophic toenails, 1 through 5 bilateral and clearing of subungual debris. No ulceration, no infection noted.  Return Visit-Office Procedure: Patient instructed to return to the office for a follow up visit 3 months for continued evaluation and treatment.  Boneta Lucks, DPM    No follow-ups on file.

## 2020-08-13 ENCOUNTER — Emergency Department
Admission: EM | Admit: 2020-08-13 | Discharge: 2020-08-13 | Disposition: A | Discharge status: Home / Self Care | Payer: Medicare PPO | Attending: Emergency Medicine | Admitting: Emergency Medicine

## 2020-08-13 ENCOUNTER — Emergency Department: Payer: Medicare PPO

## 2020-08-13 ENCOUNTER — Other Ambulatory Visit: Payer: Self-pay

## 2020-08-13 DIAGNOSIS — R220 Localized swelling, mass and lump, head: Secondary | ICD-10-CM | POA: Diagnosis not present

## 2020-08-13 DIAGNOSIS — Z5321 Procedure and treatment not carried out due to patient leaving prior to being seen by health care provider: Secondary | ICD-10-CM | POA: Diagnosis not present

## 2020-08-13 DIAGNOSIS — Q670 Congenital facial asymmetry: Secondary | ICD-10-CM | POA: Diagnosis not present

## 2020-08-13 DIAGNOSIS — G510 Bell's palsy: Secondary | ICD-10-CM | POA: Diagnosis not present

## 2020-08-13 DIAGNOSIS — K0889 Other specified disorders of teeth and supporting structures: Secondary | ICD-10-CM | POA: Insufficient documentation

## 2020-08-13 LAB — DIFFERENTIAL
Abs Immature Granulocytes: 0.03 10*3/uL (ref 0.00–0.07)
Basophils Absolute: 0.1 10*3/uL (ref 0.0–0.1)
Basophils Relative: 1 %
Eosinophils Absolute: 0.2 10*3/uL (ref 0.0–0.5)
Eosinophils Relative: 3 %
Immature Granulocytes: 0 %
Lymphocytes Relative: 27 %
Lymphs Abs: 2.1 10*3/uL (ref 0.7–4.0)
Monocytes Absolute: 0.6 10*3/uL (ref 0.1–1.0)
Monocytes Relative: 9 %
Neutro Abs: 4.5 10*3/uL (ref 1.7–7.7)
Neutrophils Relative %: 60 %

## 2020-08-13 LAB — CBC
HCT: 36.4 % (ref 36.0–46.0)
Hemoglobin: 11.2 g/dL — ABNORMAL LOW (ref 12.0–15.0)
MCH: 26.3 pg (ref 26.0–34.0)
MCHC: 30.8 g/dL (ref 30.0–36.0)
MCV: 85.4 fL (ref 80.0–100.0)
Platelets: 393 10*3/uL (ref 150–400)
RBC: 4.26 MIL/uL (ref 3.87–5.11)
RDW: 12.2 % (ref 11.5–15.5)
WBC: 7.5 10*3/uL (ref 4.0–10.5)
nRBC: 0 % (ref 0.0–0.2)

## 2020-08-13 LAB — COMPREHENSIVE METABOLIC PANEL
ALT: 12 U/L (ref 0–44)
AST: 17 U/L (ref 15–41)
Albumin: 4 g/dL (ref 3.5–5.0)
Alkaline Phosphatase: 64 U/L (ref 38–126)
Anion gap: 9 (ref 5–15)
BUN: 11 mg/dL (ref 8–23)
CO2: 29 mmol/L (ref 22–32)
Calcium: 9.7 mg/dL (ref 8.9–10.3)
Chloride: 99 mmol/L (ref 98–111)
Creatinine, Ser: 0.66 mg/dL (ref 0.44–1.00)
GFR, Estimated: >60 mL/min (ref >60)
Glucose, Bld: 118 mg/dL — ABNORMAL HIGH (ref 70–99)
Potassium: 4.1 mmol/L (ref 3.5–5.1)
Sodium: 137 mmol/L (ref 135–145)
Total Bilirubin: 0.6 mg/dL (ref 0.3–1.2)
Total Protein: 8.3 g/dL — ABNORMAL HIGH (ref 6.5–8.1)

## 2020-08-13 LAB — PROTIME-INR
INR: 1 (ref 0.8–1.2)
Prothrombin Time: 12.7 seconds (ref 11.4–15.2)

## 2020-08-13 LAB — CBG MONITORING, ED: Glucose-Capillary: 122 mg/dL — ABNORMAL HIGH (ref 70–99)

## 2020-08-13 LAB — APTT: aPTT: 32 seconds (ref 24–36)

## 2020-08-13 NOTE — ED Notes (Addendum)
Call from family, asking about VS, informed them I cannot give any information over the phone. When I returned to registration desk, pt had already had arm band removed and left. Advised family to return to ED for worsening sxs and to follow up with PCP tomorrow.

## 2020-08-13 NOTE — ED Triage Notes (Addendum)
Reports that she had toothache and left sided facial swelling yesterday, worsening today. Pt states that her daughter came over today approx 530PM and told pt that she had left sided facial droop. Pt states that she last saw someone at [facility] 08/12/20 before her daughter came over today. Strong in all extremities. Aching to left upper tooth. Left sided facial droop noted and minor left sided facial paralysis on smiling. Pt alert and oriented X4, cooperative, RR even and unlabored, color WNL. Pt in NAD. No slurred speech noted.

## 2020-08-15 ENCOUNTER — Other Ambulatory Visit: Payer: Self-pay

## 2020-08-15 ENCOUNTER — Ambulatory Visit
Admission: EM | Admit: 2020-08-15 | Discharge: 2020-08-15 | Disposition: A | Discharge status: Home / Self Care | Payer: Medicare PPO | Attending: Family Medicine | Admitting: Family Medicine

## 2020-08-15 DIAGNOSIS — K0889 Other specified disorders of teeth and supporting structures: Secondary | ICD-10-CM | POA: Diagnosis not present

## 2020-08-15 DIAGNOSIS — Z20822 Contact with and (suspected) exposure to covid-19: Secondary | ICD-10-CM

## 2020-08-15 DIAGNOSIS — R519 Headache, unspecified: Secondary | ICD-10-CM

## 2020-08-15 DIAGNOSIS — K047 Periapical abscess without sinus: Secondary | ICD-10-CM | POA: Diagnosis not present

## 2020-08-15 MED ORDER — AMOXICILLIN-POT CLAVULANATE 875-125 MG PO TABS: 1.0000 | ORAL_TABLET | Freq: Two times a day (BID) | ORAL | 0 refills | Status: AC

## 2020-08-15 NOTE — ED Triage Notes (Addendum)
Pt reports having headache and jaw pain. Symptoms began on Sunday. Pt has had a covid exposure.

## 2020-08-15 NOTE — Discharge Instructions (Addendum)
DENTAL INFECTION: Follow up with dentist at the next available appointment.  In the meantime, we will cover for dental infection with Augmentin.  Take NSAIDs/Tylenol for pain relief. Consider Orajel also. May ice the area. Follow up with dentist in the next few days. In some cases, the tooth may needed to be extracted. Follow up with [place] or ER sooner if the condition worsens before the dental appointment. If they develop a fever, significant soft tissue swelling, or worse pain, go to ER   You have received COVID testing today either for positive exposure, concerning symptoms that could be related to COVID infection, screening purposes, or re-testing after confirmed positive.  Your test obtained today checks for active viral infection in the last 1-2 weeks. If your test is negative now, you can still test positive later. So, if you do develop symptoms you should either get re-tested and/or isolate x 5 days and then strict mask use x 5 days (unvaccinated) or mask use x 10 days (vaccinated). Please follow CDC guidelines.  While Rapid antigen tests come back in 15-20 minutes, send out PCR/molecular test results typically come back within 1-3 days. In the mean time, if you are symptomatic, assume this could be a positive test and treat/monitor yourself as if you do have COVID.   We will call with test results if positive. Please download the MyChart app and set up a profile to access test results.   If symptomatic, go home and rest. Push fluids. Take Tylenol as needed for discomfort. Gargle warm salt water. Throat lozenges. Take Mucinex DM or Robitussin for cough. Humidifier in bedroom to ease coughing. Warm showers. Also review the COVID handout for more information.  COVID-19 INFECTION: The incubation period of COVID-19 is approximately 14 days after exposure, with most symptoms developing in roughly 4-5 days. Symptoms may range in severity from mild to critically severe. Roughly 80% of those infected will  have mild symptoms. People of any age may become infected with COVID-19 and have the ability to transmit the virus. The most common symptoms include: fever, fatigue, cough, body aches, headaches, sore throat, nasal congestion, shortness of breath, nausea, vomiting, diarrhea, changes in smell and/or taste.    COURSE OF ILLNESS Some patients may begin with mild disease which can progress quickly into critical symptoms. If your symptoms are worsening please call ahead to the Emergency Department and proceed there for further treatment. Recovery time appears to be roughly 1-2 weeks for mild symptoms and 3-6 weeks for severe disease.   GO IMMEDIATELY TO ER FOR FEVER YOU ARE UNABLE TO GET DOWN WITH TYLENOL, BREATHING PROBLEMS, CHEST PAIN, FATIGUE, LETHARGY, INABILITY TO EAT OR DRINK, ETC  QUARANTINE AND ISOLATION: To help decrease the spread of COVID-19 please remain isolated if you have COVID infection or are highly suspected to have COVID infection. This means -stay home and isolate to one room in the home if you live with others. Do not share a bed or bathroom with others while ill, sanitize and wipe down all countertops and keep common areas clean and disinfected. Stay home for 5 days. If you have no symptoms or your symptoms are resolving after 5 days, you can leave your house. Continue to wear a mask around others for 5 additional days. If you have been in close contact (within 6 feet) of someone diagnosed with COVID 19, you are advised to quarantine in your home for 14 days as symptoms can develop anywhere from 2-14 days after exposure to the virus.  If you develop symptoms, you  must isolate.  Most current guidelines for COVID after exposure -unvaccinated: isolate 5 days and strict mask use x 5 days. Test on day 5 is possible -vaccinated: wear mask x 10 days if symptoms do not develop -You do not necessarily need to be tested for COVID if you have + exposure and  develop symptoms. Just isolate at home  x10 days from symptom onset During this global pandemic, CDC advises to practice social distancing, try to stay at least 8ft away from others at all times. Wear a face covering. Wash and sanitize your hands regularly and avoid going anywhere that is not necessary.  KEEP IN MIND THAT THE COVID TEST IS NOT 100% ACCURATE AND YOU SHOULD STILL DO EVERYTHING TO PREVENT POTENTIAL SPREAD OF VIRUS TO OTHERS (WEAR MASK, WEAR GLOVES, WASH HANDS AND SANITIZE REGULARLY). IF INITIAL TEST IS NEGATIVE, THIS MAY NOT MEAN YOU ARE DEFINITELY NEGATIVE. MOST ACCURATE TESTING IS DONE 5-7 DAYS AFTER EXPOSURE.   It is not advised by CDC to get re-tested after receiving a positive COVID test since you can still test positive for weeks to months after you have already cleared the virus.   *If you have not been vaccinated for COVID, I strongly suggest you consider getting vaccinated as long as there are no contraindications.

## 2020-08-15 NOTE — ED Provider Notes (Signed)
[facility]    CSN: [id number] Arrival date & time: 08/15/20  1336      History   Chief Complaint Chief Complaint  Patient presents with  . Headache    HPI Heather Small is a 85 y.o. female presenting for complaint of headache and left jaw pain x 3 days.  She says she is also had some dental pain of the upper left back tooth and swelling of her left cheek.  Patient states she would like a Covid test that she has been exposed to COVID-19.  She has not been vaccinated for COVID-19.  Patient denies any fever, fatigue, body aches, cough, sore throat, congestion.  She says her headaches are not severe and come and go.  She denies any visual disturbance, numbness/tingling, facial droop or weakness, confusion, speech or balance issues.  Additionally, she denies any chest pain, breathing difficulty, dizziness, palpitations or weakness.  Denies any left extremity pain or upper back pain.  Has not taken anything for the discomfort.  Patient says she has a dental appointment tomorrow, but she wanted to get checked out today sooner. Past medical history significant for hypertension, hyperlipidemia, and diabetes.   No other complaints or concerns.  HPI  Past Medical History:  Diagnosis Date  . Diabetes mellitus without complication ([place])   . Hyperlipemia   . Hypertension   . Hypothyroidism   . Thyroid disease     Patient Active Problem List   Diagnosis Date Noted  . Septic shock due to Escherichia coli ([place]) 04/26/2020  . Normocytic anemia 04/26/2020  . Type 2 diabetes mellitus without complication ([place]) 0000000  . Essential hypertension 04/25/2020  . HLD (hyperlipidemia) 04/25/2020  . Hypothyroidism 04/25/2020  . 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 Stricture esophagus   . Sepsis ([place]) 02/18/2017  . History of CVA (cerebrovascular accident) 07/01/2015    Past Surgical History:  Procedure Laterality Date  . COLONOSCOPY    . COLONOSCOPY WITH PROPOFOL N/A 04/28/2020   Procedure:  COLONOSCOPY WITH PROPOFOL;  Surgeon: Jonathon Bellows, MD;  Location: [facility] ENDOSCOPY;  Service: Gastroenterology;  Laterality: N/A;  . ESOPHAGEAL DILATION    . ESOPHAGOGASTRODUODENOSCOPY (EGD) WITH PROPOFOL N/A 07/13/2018   Procedure: ESOPHAGOGASTRODUODENOSCOPY (EGD) WITH PROPOFOL;  Surgeon: Jonathon Bellows, MD;  Location: [facility] ENDOSCOPY;  Service: Gastroenterology;  Laterality: N/A;  . ESOPHAGOGASTRODUODENOSCOPY (EGD) WITH PROPOFOL N/A 07/30/2018   Procedure: ESOPHAGOGASTRODUODENOSCOPY (EGD) WITH PROPOFOL;  Surgeon: Jonathon Bellows, MD;  Location: [facility] ENDOSCOPY;  Service: Gastroenterology;  Laterality: N/A;  . ESOPHAGOGASTRODUODENOSCOPY (EGD) WITH PROPOFOL N/A 08/18/2018   Procedure: EGD with Dilation;  Surgeon: Jonathon Bellows, MD;  Location: [facility] ENDOSCOPY;  Service: Gastroenterology;  Laterality: N/A;  . ESOPHAGOGASTRODUODENOSCOPY (EGD) WITH PROPOFOL N/A 09/29/2018   Procedure: ESOPHAGOGASTRODUODENOSCOPY (EGD) WITH PROPOFOL;  Surgeon: Jonathon Bellows, MD;  Location: [facility] ENDOSCOPY;  Service: Gastroenterology;  Laterality: N/A;  . MASTECTOMY Left     OB History   No obstetric history on file.      Home Medications    Prior to Admission medications   Medication Sig Start Date End Date Taking? Authorizing Provider  amoxicillin-clavulanate (AUGMENTIN) 875-125 MG tablet Take 1 tablet by mouth every 12 (twelve) hours for 10 days. 08/15/20 08/25/20 Yes Laurene Footman B, PA-C  brimonidine (ALPHAGAN) 0.15 % ophthalmic solution Place 1 drop into both eyes 2 (two) times daily. 04/11/15   [provider]  dorzolamide-timolol (COSOPT) 22.3-6.8 MG/ML ophthalmic solution 1 drop 2 (two) times daily.    [provider]  levothyroxine (SYNTHROID) 100 MCG tablet  Take 100 mcg by mouth daily before breakfast.    [provider]  losartan-hydrochlorothiazide (HYZAAR) 100-25 MG tablet losartan 100 mg-hydrochlorothiazide 25 mg tablet    [provider]  LUMIGAN 0.01 % SOLN Place 1 drop into both eyes  at bedtime. 04/11/15   [provider]  metFORMIN (GLUCOPHAGE) 500 MG tablet Take by mouth 2 (two) times daily with a meal.    [provider]  Multiple Vitamin (MULTIVITAMIN) tablet Take 1 tablet by mouth daily.    [provider]  omeprazole (PRILOSEC) 40 MG capsule Take 40 mg by mouth in the morning and at bedtime.    [provider]  pilocarpine (PILOCAR) 4 % ophthalmic solution Place 2 drops into both eyes 4 (four) times daily.    [provider]  simvastatin (ZOCOR) 20 MG tablet Take 1 tablet by mouth at bedtime. 06/15/15   [provider]  timolol (TIMOPTIC) 0.5 % ophthalmic solution Place 1 drop into both eyes 2 (two) times daily. 04/11/15   [provider]  valsartan-hydrochlorothiazide (DIOVAN-HCT) 320-25 MG tablet Take 1 tablet by mouth daily. 04/13/15 01/23/20  [provider]    Family History Family History  Problem Relation Age of Onset  . Diabetes Other   . Heart Problems Mother   . Cancer Father     Social History Social History   Tobacco Use  . Smoking status: Never Smoker  . Smokeless tobacco: Never Used  Vaping Use  . Vaping Use: Never used  Substance Use Topics  . Alcohol use: No  . Drug use: No     Allergies   Codeine   Review of Systems Review of Systems  Constitutional: Negative for chills, diaphoresis, fatigue and fever.  HENT: Positive for dental problem and facial swelling. Negative for congestion, ear pain, rhinorrhea, sinus pressure, sinus pain and sore throat.   Respiratory: Negative for cough and shortness of breath.   Gastrointestinal: Negative for abdominal pain, nausea and vomiting.  Musculoskeletal: Negative for arthralgias and myalgias.  Skin: Negative for rash.  Neurological: Positive for headaches. Negative for weakness.  Hematological: Negative for adenopathy.     Physical Exam Triage Vital Signs ED Triage Vitals  Enc Vitals Group     BP 08/15/20 1545 (!)  179/88     Pulse Rate 08/15/20 1545 100     Resp 08/15/20 1545 16     Temp 08/15/20 1545 98.5 F (36.9 C)     Temp Source 08/15/20 1545 Oral     SpO2 08/15/20 1545 95 %     Weight 08/15/20 1511 148 lb (67.1 kg)     Height 08/15/20 1511 5\' 4"  (1.626 m)     Head Circumference --      Peak Flow --      Pain Score 08/15/20 1511 2     Pain Loc --      Pain Edu? --      Excl. in [place]? --    No data found.  Updated Vital Signs BP (!) 152/82   Pulse 100   Temp 98.5 F (36.9 C) (Oral)   Resp 16   Ht 5\' 4"  (1.626 m)   Wt 148 lb (67.1 kg)   SpO2 95%   BMI 25.40 kg/m      Physical Exam Vitals and nursing note reviewed.  Constitutional:      General: She is not in acute distress.    Appearance: Normal appearance. She is not ill-appearing or toxic-appearing.  HENT:  Head: Normocephalic and atraumatic.     Comments: Moderate swelling of the left cheek    Nose: Nose normal.     Mouth/Throat:     Mouth: Mucous membranes are moist.     Dentition: Dental tenderness, gingival swelling and dental caries (multiple throughout. Upper left last molar severe decay and surrounding erythema and swelling) present.     Pharynx: Oropharynx is clear.  Eyes:     General: No scleral icterus.       Right eye: No discharge.        Left eye: No discharge.     Extraocular Movements: Extraocular movements intact.     Conjunctiva/sclera: Conjunctivae normal.     Pupils: Pupils are equal, round, and reactive to light.  Cardiovascular:     Rate and Rhythm: Normal rate and regular rhythm.     Heart sounds: Normal heart sounds.  Pulmonary:     Effort: Pulmonary effort is normal. No respiratory distress.     Breath sounds: Normal breath sounds. No wheezing, rhonchi or rales.  Musculoskeletal:     Cervical back: Neck supple.  Skin:    General: Skin is dry.  Neurological:     General: No focal deficit present.     Mental Status: She is alert and oriented to person, place, and time. Mental status is  at baseline.     Cranial Nerves: No cranial nerve deficit.     Motor: No weakness.     Gait: Gait normal.  Psychiatric:        Mood and Affect: Mood normal.        Behavior: Behavior normal.        Thought Content: Thought content normal.      UC Treatments / Results  Labs (all labs ordered are listed, but only abnormal results are displayed) Labs Reviewed  SARS CORONAVIRUS 2 (TAT 6-24 HRS)    EKG   Radiology CT HEAD WO CONTRAST  Result Date: 08/13/2020 CLINICAL DATA:  Stroke, facial swelling, facial droop EXAM: CT HEAD WITHOUT CONTRAST TECHNIQUE: Contiguous axial images were obtained from the base of the skull through the vertex without intravenous contrast. COMPARISON:  None. FINDINGS: Brain: Normal anatomic configuration. Parenchymal volume loss is commensurate with the patient's age. Moderate periventricular white matter changes are present likely reflecting the sequela of small vessel ischemia. No abnormal intra or extra-axial mass lesion or fluid collection. No abnormal mass effect or midline shift. No evidence of acute intracranial hemorrhage or infarct. Ventricular size is normal. Cerebellum unremarkable. Vascular: No asymmetric hyperdense vasculature at the skull base. Skull: Intact Sinuses/Orbits: Paranasal sinuses are clear. Orbits are unremarkable. Other: Mastoid air cells and middle ear cavities are clear. IMPRESSION: No acute intracranial abnormality. No significant paranasal sinus disease within the visualized paranasal sinuses. Moderate senescent change. Electronically Signed   By: Helyn Numbers MD   On: 08/13/2020 20:14    Procedures Procedures (including critical care time)  Medications Ordered in UC Medications - No data to display  Initial Impression / Assessment and Plan / UC Course  I have reviewed the triage vital signs and the nursing notes.  Pertinent labs & imaging results that were available during my care of the patient were reviewed by me and  considered in my medical decision making (see chart for details).   Exam consistent with dental infection.  Treating with Augmentin at this time.  Advised her to keep her dentist appointment tomorrow.  Advised supportive care with Tylenol for pain relief.  Covid  testing obtained.  CDC guidelines, isolation protocol, and ED precautions reviewed with patient.  I did see the patient went to the ED 3 days ago for complaint of left facial swelling.  A CT scan was performed at that time to rule out possible stroke and a CT was normal.  Patient denies any neurological symptoms to be abnormal neurological exam today.  Advised her to go back to ED if she develops any severe headaches or neurological signs or symptoms.  Patient agreeable.   Final Clinical Impressions(s) / UC Diagnoses   Final diagnoses:  Pain, dental  Dental infection  Acute nonintractable headache, unspecified headache type  Exposure to COVID-19 virus     Discharge Instructions     DENTAL INFECTION: Follow up with dentist at the next available appointment.  In the meantime, we will cover for dental infection with Augmentin.  Take NSAIDs/Tylenol for pain relief. Consider Orajel also. May ice the area. Follow up with dentist in the next few days. In some cases, the tooth may needed to be extracted. Follow up with [place] or ER sooner if the condition worsens before the dental appointment. If they develop a fever, significant soft tissue swelling, or worse pain, go to ER   You have received COVID testing today either for positive exposure, concerning symptoms that could be related to COVID infection, screening purposes, or re-testing after confirmed positive.  Your test obtained today checks for active viral infection in the last 1-2 weeks. If your test is negative now, you can still test positive later. So, if you do develop symptoms you should either get re-tested and/or isolate x 5 days and then strict mask use x 5 days (unvaccinated) or  mask use x 10 days (vaccinated). Please follow CDC guidelines.  While Rapid antigen tests come back in 15-20 minutes, send out PCR/molecular test results typically come back within 1-3 days. In the mean time, if you are symptomatic, assume this could be a positive test and treat/monitor yourself as if you do have COVID.   We will call with test results if positive. Please download the MyChart app and set up a profile to access test results.   If symptomatic, go home and rest. Push fluids. Take Tylenol as needed for discomfort. Gargle warm salt water. Throat lozenges. Take Mucinex DM or Robitussin for cough. Humidifier in bedroom to ease coughing. Warm showers. Also review the COVID handout for more information.  COVID-19 INFECTION: The incubation period of COVID-19 is approximately 14 days after exposure, with most symptoms developing in roughly 4-5 days. Symptoms may range in severity from mild to critically severe. Roughly 80% of those infected will have mild symptoms. People of any age may become infected with COVID-19 and have the ability to transmit the virus. The most common symptoms include: fever, fatigue, cough, body aches, headaches, sore throat, nasal congestion, shortness of breath, nausea, vomiting, diarrhea, changes in smell and/or taste.    COURSE OF ILLNESS Some patients may begin with mild disease which can progress quickly into critical symptoms. If your symptoms are worsening please call ahead to the Emergency Department and proceed there for further treatment. Recovery time appears to be roughly 1-2 weeks for mild symptoms and 3-6 weeks for severe disease.   GO IMMEDIATELY TO ER FOR FEVER YOU ARE UNABLE TO GET DOWN WITH TYLENOL, BREATHING PROBLEMS, CHEST PAIN, FATIGUE, LETHARGY, INABILITY TO EAT OR DRINK, ETC  QUARANTINE AND ISOLATION: To help decrease the spread of COVID-19 please remain isolated if you have  COVID infection or are highly suspected to have COVID infection. This  means -stay home and isolate to one room in the home if you live with others. Do not share a bed or bathroom with others while ill, sanitize and wipe down all countertops and keep common areas clean and disinfected. Stay home for 5 days. If you have no symptoms or your symptoms are resolving after 5 days, you can leave your house. Continue to wear a mask around others for 5 additional days. If you have been in close contact (within 6 feet) of someone diagnosed with COVID 19, you are advised to quarantine in your home for 14 days as symptoms can develop anywhere from 2-14 days after exposure to the virus. If you develop symptoms, you  must isolate.  Most current guidelines for COVID after exposure -unvaccinated: isolate 5 days and strict mask use x 5 days. Test on day 5 is possible -vaccinated: wear mask x 10 days if symptoms do not develop -You do not necessarily need to be tested for COVID if you have + exposure and  develop symptoms. Just isolate at home x10 days from symptom onset During this global pandemic, CDC advises to practice social distancing, try to stay at least 20ft away from others at all times. Wear a face covering. Wash and sanitize your hands regularly and avoid going anywhere that is not necessary.  KEEP IN MIND THAT THE COVID TEST IS NOT 100% ACCURATE AND YOU SHOULD STILL DO EVERYTHING TO PREVENT POTENTIAL SPREAD OF VIRUS TO OTHERS (WEAR MASK, WEAR GLOVES, [place] HANDS AND SANITIZE REGULARLY). IF INITIAL TEST IS NEGATIVE, THIS MAY NOT MEAN YOU ARE DEFINITELY NEGATIVE. MOST ACCURATE TESTING IS DONE 5-7 DAYS AFTER EXPOSURE.   It is not advised by CDC to get re-tested after receiving a positive COVID test since you can still test positive for weeks to months after you have already cleared the virus.   *If you have not been vaccinated for COVID, I strongly suggest you consider getting vaccinated as long as there are no contraindications.      ED Prescriptions    Medication Sig Dispense  Auth. Provider   amoxicillin-clavulanate (AUGMENTIN) 875-125 MG tablet Take 1 tablet by mouth every 12 (twelve) hours for 10 days. 20 tablet Danton Clap, PA-C     I have reviewed the PDMP during this encounter.   Danton Clap, PA-C 08/15/20 [id number]

## 2020-08-16 LAB — SARS CORONAVIRUS 2 (TAT 6-24 HRS): SARS Coronavirus 2: NEGATIVE

## 2020-08-22 ENCOUNTER — Ambulatory Visit (INDEPENDENT_AMBULATORY_CARE_PROVIDER_SITE_OTHER): Payer: Medicare PPO | Admitting: Podiatry

## 2020-08-22 ENCOUNTER — Encounter: Payer: Self-pay | Admitting: Podiatry

## 2020-08-22 ENCOUNTER — Other Ambulatory Visit: Payer: Self-pay

## 2020-08-22 DIAGNOSIS — M79675 Pain in left toe(s): Secondary | ICD-10-CM | POA: Diagnosis not present

## 2020-08-22 DIAGNOSIS — B351 Tinea unguium: Secondary | ICD-10-CM | POA: Diagnosis not present

## 2020-08-22 DIAGNOSIS — M79674 Pain in right toe(s): Secondary | ICD-10-CM

## 2020-08-22 DIAGNOSIS — E1142 Type 2 diabetes mellitus with diabetic polyneuropathy: Secondary | ICD-10-CM

## 2020-08-22 NOTE — Progress Notes (Signed)
  Subjective:  Patient ID: Heather Small, female    DOB: 09/02/1935,  MRN: [id number]  Chief Complaint  Patient presents with  . Nail Problem  . Diabetes    Nail trim [facility]   85 y.o. female returns for the above complaint.  Patient presents with thickened elongated dystrophic digits atrophic discolored nails x10.  They are mild pain on palpation.  Patient would like to have them debrided down.  She denies any other acute complaints.  Her last A1c was 8.9.  She is a type II diabetic  Objective:  There were no vitals filed for this visit. Podiatric Exam: Vascular: dorsalis pedis and posterior tibial pulses are palpable bilateral. Capillary return is immediate. Temperature gradient is WNL. Skin turgor WNL  Sensorium: Normal Semmes Weinstein monofilament test. Normal tactile sensation bilaterally. Nail Exam: Pt has thick disfigured discolored nails with subungual debris noted bilateral entire nail hallux through fifth toenails.  Pain on palpation to the nails. Ulcer Exam: There is no evidence of ulcer or pre-ulcerative changes or infection. Orthopedic Exam: Muscle tone and strength are WNL. No limitations in general ROM. No crepitus or effusions noted. HAV  B/L.  Hammer toes 2-5  B/L. Skin: No Porokeratosis. No infection or ulcers.  Bilateral 1+ pitting edema generalized circumferential around the ankle.  No ulcers noted.    Assessment & Plan:   1. Type 2 diabetes mellitus with diabetic polyneuropathy, without long-term current use of insulin (HCC)   2. Pain due to onychomycosis of toenails of both feet     Patient was evaluated and treated and all questions answered.  Bilateral ankle swelling -Clinically improving  Onychomycosis with pain  -Nails palliatively debrided as below. -Educated on self-care  Procedure: Nail Debridement Rationale: pain  Type of Debridement: manual, sharp debridement. Instrumentation: Nail nipper, rotary burr. Number of Nails: 10  Procedures and  Treatment: Consent by patient was obtained for treatment procedures. The patient understood the discussion of treatment and procedures well. All questions were answered thoroughly reviewed. Debridement of mycotic and hypertrophic toenails, 1 through 5 bilateral and clearing of subungual debris. No ulceration, no infection noted.  Return Visit-Office Procedure: Patient instructed to return to the office for a follow up visit 3 months for continued evaluation and treatment.  Boneta Lucks, DPM    No follow-ups on file.

## 2020-11-21 ENCOUNTER — Encounter: Payer: Self-pay | Admitting: Podiatry

## 2020-11-21 ENCOUNTER — Other Ambulatory Visit: Payer: Self-pay

## 2020-11-21 ENCOUNTER — Ambulatory Visit (INDEPENDENT_AMBULATORY_CARE_PROVIDER_SITE_OTHER): Payer: Medicare PPO | Admitting: Podiatry

## 2020-11-21 DIAGNOSIS — M79675 Pain in left toe(s): Secondary | ICD-10-CM | POA: Diagnosis not present

## 2020-11-21 DIAGNOSIS — B351 Tinea unguium: Secondary | ICD-10-CM | POA: Diagnosis not present

## 2020-11-21 DIAGNOSIS — M79674 Pain in right toe(s): Secondary | ICD-10-CM

## 2020-11-21 DIAGNOSIS — E1142 Type 2 diabetes mellitus with diabetic polyneuropathy: Secondary | ICD-10-CM

## 2020-11-21 MED ORDER — CICLOPIROX 8 % EX SOLN: Freq: Every day | CUTANEOUS | 2 refills | Status: AC

## 2020-11-22 ENCOUNTER — Encounter: Payer: Self-pay | Admitting: Podiatry

## 2020-11-22 NOTE — Progress Notes (Signed)
  Subjective:  Patient ID: Heather Small, female    DOB: 01/31/1936,  MRN: [id number]  Chief Complaint  Patient presents with  . Nail Problem   85 y.o. female returns for the above complaint.  Patient presents with thickened elongated dystrophic digits atrophic discolored nails x10.  They are mild pain on palpation.  Patient would like to have them debrided down.  She denies any other acute complaints.  Her last A1c was 8.9.  She is a type II diabetic.  She would also like to treat this topically as well.  She would like me to discuss treatment options with her for fungus.  Objective:  There were no vitals filed for this visit. Podiatric Exam: Vascular: dorsalis pedis and posterior tibial pulses are palpable bilateral. Capillary return is immediate. Temperature gradient is WNL. Skin turgor WNL  Sensorium: Normal Semmes Weinstein monofilament test. Normal tactile sensation bilaterally. Nail Exam: Pt has thick disfigured discolored nails with subungual debris noted bilateral entire nail hallux through fifth toenails.  Pain on palpation to the nails. Ulcer Exam: There is no evidence of ulcer or pre-ulcerative changes or infection. Orthopedic Exam: Muscle tone and strength are WNL. No limitations in general ROM. No crepitus or effusions noted. HAV  B/L.  Hammer toes 2-5  B/L. Skin: No Porokeratosis. No infection or ulcers.  Bilateral 1+ pitting edema generalized circumferential around the ankle.  No ulcers noted.    Assessment & Plan:   1. Type 2 diabetes mellitus with diabetic polyneuropathy, without long-term current use of insulin (HCC)   2. Pain due to onychomycosis of toenails of both feet   3. Onychomycosis due to dermatophyte     Patient was evaluated and treated and all questions answered.  Bilateral ankle swelling -Clinically improving  Onychomycosis with pain  -Nails palliatively debrided as below. -Educated on self-care -Penlac was dispensed to address the onychomycosis.  I  have asked her to apply twice a day for 6 to 8 months.  Multiple refills were sent as well.  Patient states understanding and will do so.  Procedure: Nail Debridement Rationale: pain  Type of Debridement: manual, sharp debridement. Instrumentation: Nail nipper, rotary burr. Number of Nails: 10  Procedures and Treatment: Consent by patient was obtained for treatment procedures. The patient understood the discussion of treatment and procedures well. All questions were answered thoroughly reviewed. Debridement of mycotic and hypertrophic toenails, 1 through 5 bilateral and clearing of subungual debris. No ulceration, no infection noted.  Return Visit-Office Procedure: Patient instructed to return to the office for a follow up visit 3 months for continued evaluation and treatment.  Boneta Lucks, DPM    No follow-ups on file.

## 2021-02-20 ENCOUNTER — Ambulatory Visit (INDEPENDENT_AMBULATORY_CARE_PROVIDER_SITE_OTHER): Payer: Medicare PPO | Admitting: Podiatry

## 2021-02-20 ENCOUNTER — Other Ambulatory Visit: Payer: Self-pay

## 2021-02-20 ENCOUNTER — Ambulatory Visit: Payer: Medicare PPO | Admitting: Podiatry

## 2021-02-20 DIAGNOSIS — B351 Tinea unguium: Secondary | ICD-10-CM | POA: Diagnosis not present

## 2021-02-20 DIAGNOSIS — M79674 Pain in right toe(s): Secondary | ICD-10-CM

## 2021-02-20 DIAGNOSIS — M79675 Pain in left toe(s): Secondary | ICD-10-CM | POA: Diagnosis not present

## 2021-02-20 DIAGNOSIS — E1142 Type 2 diabetes mellitus with diabetic polyneuropathy: Secondary | ICD-10-CM | POA: Diagnosis not present

## 2021-02-21 ENCOUNTER — Encounter: Payer: Self-pay | Admitting: Podiatry

## 2021-02-21 NOTE — Progress Notes (Signed)
  Subjective:  Patient ID: Heather Small, female    DOB: 19-Apr-1936,  MRN: [id number]  Chief Complaint  Patient presents with   Nail Problem    Nail trim    85 y.o. female returns for the above complaint.  Patient presents with thickened elongated dystrophic digits atrophic discolored nails x10.  They are mild pain on palpation.  Patient would like to have them debrided down.  She denies any other acute complaints.  Her last A1c was 8.9.  She is a type II diabetic.  She would also like to treat this topically as well.  She would like me to discuss treatment options with her for fungus.  Objective:  There were no vitals filed for this visit. Podiatric Exam: Vascular: dorsalis pedis and posterior tibial pulses are palpable bilateral. Capillary return is immediate. Temperature gradient is WNL. Skin turgor WNL  Sensorium: Normal Semmes Weinstein monofilament test. Normal tactile sensation bilaterally. Nail Exam: Pt has thick disfigured discolored nails with subungual debris noted bilateral entire nail hallux through fifth toenails.  Pain on palpation to the nails. Ulcer Exam: There is no evidence of ulcer or pre-ulcerative changes or infection. Orthopedic Exam: Muscle tone and strength are WNL. No limitations in general ROM. No crepitus or effusions noted. HAV  B/L.  Hammer toes 2-5  B/L. Skin: No Porokeratosis. No infection or ulcers.  Bilateral 1+ pitting edema generalized circumferential around the ankle.  No ulcers noted.    Assessment & Plan:   1. Pain due to onychomycosis of toenails of both feet   2. Type 2 diabetes mellitus with diabetic polyneuropathy, without long-term current use of insulin ([place])      Patient was evaluated and treated and all questions answered.  Bilateral ankle swelling -Clinically improving  Onychomycosis with pain  -Nails palliatively debrided as below. -Educated on self-care -Penlac was dispensed to address the onychomycosis.  I have asked her to apply  twice a day for 6 to 8 months.  Multiple refills were sent as well.  Patient states understanding and will do so.  Procedure: Nail Debridement Rationale: pain  Type of Debridement: manual, sharp debridement. Instrumentation: Nail nipper, rotary burr. Number of Nails: 10  Procedures and Treatment: Consent by patient was obtained for treatment procedures. The patient understood the discussion of treatment and procedures well. All questions were answered thoroughly reviewed. Debridement of mycotic and hypertrophic toenails, 1 through 5 bilateral and clearing of subungual debris. No ulceration, no infection noted.  Return Visit-Office Procedure: Patient instructed to return to the office for a follow up visit 3 months for continued evaluation and treatment.  Boneta Lucks, DPM    No follow-ups on file.

## 2021-05-24 ENCOUNTER — Other Ambulatory Visit: Payer: Self-pay

## 2021-05-24 ENCOUNTER — Ambulatory Visit (INDEPENDENT_AMBULATORY_CARE_PROVIDER_SITE_OTHER): Payer: Medicare PPO | Admitting: Podiatry

## 2021-05-24 ENCOUNTER — Encounter: Payer: Self-pay | Admitting: Podiatry

## 2021-05-24 DIAGNOSIS — M79674 Pain in right toe(s): Secondary | ICD-10-CM | POA: Diagnosis not present

## 2021-05-24 DIAGNOSIS — M79675 Pain in left toe(s): Secondary | ICD-10-CM | POA: Diagnosis not present

## 2021-05-24 DIAGNOSIS — B351 Tinea unguium: Secondary | ICD-10-CM

## 2021-05-24 DIAGNOSIS — E1142 Type 2 diabetes mellitus with diabetic polyneuropathy: Secondary | ICD-10-CM

## 2021-05-24 NOTE — Progress Notes (Signed)
  Subjective:  Patient ID: Heather Small, female    DOB: Jul 29, 1936,  MRN: [id number]  Chief Complaint  Patient presents with   Nail Problem    Nail trim    85 y.o. female returns for the above complaint.  Patient presents with thickened elongated dystrophic digits atrophic discolored nails x10.  They are mild pain on palpation.  Patient would like to have them debrided down.  She denies any other acute complaints.  Her last A1c was 8.9.  She is a type II diabetic.  She would also like to treat this topically as well.  She would like me to discuss treatment options with her for fungus.  Objective:  There were no vitals filed for this visit. Podiatric Exam: Vascular: dorsalis pedis and posterior tibial pulses are palpable bilateral. Capillary return is immediate. Temperature gradient is WNL. Skin turgor WNL  Sensorium: Normal Semmes Weinstein monofilament test. Normal tactile sensation bilaterally. Nail Exam: Pt has thick disfigured discolored nails with subungual debris noted bilateral entire nail hallux through fifth toenails.  Pain on palpation to the nails. Ulcer Exam: There is no evidence of ulcer or pre-ulcerative changes or infection. Orthopedic Exam: Muscle tone and strength are WNL. No limitations in general ROM. No crepitus or effusions noted. HAV  B/L.  Hammer toes 2-5  B/L. Skin: No Porokeratosis. No infection or ulcers.  Bilateral 1+ pitting edema generalized circumferential around the ankle.  No ulcers noted.    Assessment & Plan:   1. Pain due to onychomycosis of toenails of both feet   2. Type 2 diabetes mellitus with diabetic polyneuropathy, without long-term current use of insulin ([place])       Patient was evaluated and treated and all questions answered.  Bilateral ankle swelling -Clinically improving  Onychomycosis with pain  -Nails palliatively debrided as below. -Educated on self-care -Penlac was dispensed to address the onychomycosis.  I have asked her to apply  twice a day for 6 to 8 months.  Multiple refills were sent as well.  Patient states understanding and will do so.  Procedure: Nail Debridement Rationale: pain  Type of Debridement: manual, sharp debridement. Instrumentation: Nail nipper, rotary burr. Number of Nails: 10  Procedures and Treatment: Consent by patient was obtained for treatment procedures. The patient understood the discussion of treatment and procedures well. All questions were answered thoroughly reviewed. Debridement of mycotic and hypertrophic toenails, 1 through 5 bilateral and clearing of subungual debris. No ulceration, no infection noted.  Return Visit-Office Procedure: Patient instructed to return to the office for a follow up visit 3 months for continued evaluation and treatment.  Boneta Lucks, DPM    No follow-ups on file.

## 2021-08-23 ENCOUNTER — Other Ambulatory Visit: Payer: Self-pay | Admitting: Internal Medicine

## 2021-08-23 DIAGNOSIS — Z1231 Encounter for screening mammogram for malignant neoplasm of breast: Secondary | ICD-10-CM

## 2021-08-30 ENCOUNTER — Ambulatory Visit
Admission: RE | Admit: 2021-08-30 | Discharge: 2021-08-30 | Disposition: A | Discharge status: Home / Self Care | Payer: Medicare PPO | Source: Ambulatory Visit | Attending: Internal Medicine | Admitting: Internal Medicine

## 2021-08-30 ENCOUNTER — Other Ambulatory Visit: Payer: Self-pay

## 2021-08-30 ENCOUNTER — Other Ambulatory Visit: Payer: Self-pay | Admitting: Internal Medicine

## 2021-08-30 DIAGNOSIS — Z1231 Encounter for screening mammogram for malignant neoplasm of breast: Secondary | ICD-10-CM | POA: Diagnosis not present

## 2021-08-30 HISTORY — DX: Personal history of irradiation: Z92.3

## 2021-08-30 HISTORY — DX: Malignant neoplasm of unspecified site of unspecified female breast: C50.919

## 2021-09-05 ENCOUNTER — Other Ambulatory Visit: Payer: Self-pay | Admitting: Internal Medicine

## 2021-09-05 DIAGNOSIS — N6310 Unspecified lump in the right breast, unspecified quadrant: Secondary | ICD-10-CM

## 2021-09-05 DIAGNOSIS — R928 Other abnormal and inconclusive findings on diagnostic imaging of breast: Secondary | ICD-10-CM

## 2021-09-06 ENCOUNTER — Ambulatory Visit: Payer: Medicare PPO | Admitting: Podiatry

## 2022-08-02 ENCOUNTER — Ambulatory Visit: Admit: 2022-08-02 | Discharge status: Absent without leave | Payer: Medicare PPO

## 2022-08-03 ENCOUNTER — Ambulatory Visit
Admission: EM | Admit: 2022-08-03 | Discharge: 2022-08-03 | Disposition: A | Discharge status: Home / Self Care | Payer: Medicare PPO | Attending: Internal Medicine | Admitting: Internal Medicine

## 2022-08-03 ENCOUNTER — Encounter: Payer: Self-pay | Admitting: Emergency Medicine

## 2022-08-03 DIAGNOSIS — R0781 Pleurodynia: Secondary | ICD-10-CM | POA: Diagnosis not present

## 2022-08-03 MED ORDER — LIDOCAINE 5 % EX PTCH: 1.0000 | MEDICATED_PATCH | CUTANEOUS | 0 refills | Status: AC

## 2022-08-03 NOTE — ED Provider Notes (Signed)
[facility]    CSN: [id number] Arrival date & time: 08/03/22  9937      History   Chief Complaint Chief Complaint  Patient presents with   Cough   Generalized Body Aches    HPI Heather Small is a 86 y.o. female with a history of breast cancer, DM2, HTN, HLD, hypothyroidism presents to UC today with complaint of intermittent sharp pains in her left side.  She reports this started last night.  She reports the pains were intermittent and have slightly improved this morning.  She also reports runny nose, cough and intermittent diarrhea but reports these are chronic issues for her and not anything necessarily associated with these sharp pains.  She denies headache, nasal congestion, ear pain, sore throat, shortness of breath, chest pain, nausea, vomiting, constipation or blood in her stool.  She denies fever, chills.  She denies urinary symptoms, weakness, lightheadedness or near syncope.  She has tried Mucinex with minimal relief of symptoms.  HPI  Past Medical History:  Diagnosis Date   Breast cancer ([place])    Diabetes mellitus without complication ([place])    Hyperlipemia    Hypertension    Hypothyroidism    Personal history of radiation therapy    Thyroid disease     Patient Active Problem List   Diagnosis Date Noted   Septic shock due to Escherichia coli ([place]) 04/26/2020   Normocytic anemia 04/26/2020   Type 2 diabetes mellitus without complication ([place]) 16/96/7893   Essential hypertension 04/25/2020   HLD (hyperlipidemia) 04/25/2020   Hypothyroidism 04/25/2020   Cecum mass 04/25/2020   Stricture esophagus    Sepsis ([place]) 02/18/2017   History of CVA (cerebrovascular accident) 07/01/2015    Past Surgical History:  Procedure Laterality Date   COLONOSCOPY     COLONOSCOPY WITH PROPOFOL N/A 04/28/2020   Procedure: COLONOSCOPY WITH PROPOFOL;  Surgeon: Jonathon Bellows, MD;  Location: [facility] ENDOSCOPY;  Service: Gastroenterology;  Laterality: N/A;   ESOPHAGEAL DILATION      ESOPHAGOGASTRODUODENOSCOPY (EGD) WITH PROPOFOL N/A 07/13/2018   Procedure: ESOPHAGOGASTRODUODENOSCOPY (EGD) WITH PROPOFOL;  Surgeon: Jonathon Bellows, MD;  Location: [facility] ENDOSCOPY;  Service: Gastroenterology;  Laterality: N/A;   ESOPHAGOGASTRODUODENOSCOPY (EGD) WITH PROPOFOL N/A 07/30/2018   Procedure: ESOPHAGOGASTRODUODENOSCOPY (EGD) WITH PROPOFOL;  Surgeon: Jonathon Bellows, MD;  Location: [facility] ENDOSCOPY;  Service: Gastroenterology;  Laterality: N/A;   ESOPHAGOGASTRODUODENOSCOPY (EGD) WITH PROPOFOL N/A 08/18/2018   Procedure: EGD with Dilation;  Surgeon: Jonathon Bellows, MD;  Location: [facility] ENDOSCOPY;  Service: Gastroenterology;  Laterality: N/A;   ESOPHAGOGASTRODUODENOSCOPY (EGD) WITH PROPOFOL N/A 09/29/2018   Procedure: ESOPHAGOGASTRODUODENOSCOPY (EGD) WITH PROPOFOL;  Surgeon: Jonathon Bellows, MD;  Location: [facility] ENDOSCOPY;  Service: Gastroenterology;  Laterality: N/A;   MASTECTOMY Left     OB History   No obstetric history on file.      Home Medications    Prior to Admission medications   Medication Sig Start Date End Date Taking? Authorizing Provider  lidocaine (LIDODERM) 5 % Place 1 patch onto the skin daily. Remove & Discard patch within 12 hours or as directed by MD 08/03/22  Yes Emmajean Ratledge, Coralie Keens, NP  brimonidine (ALPHAGAN) 0.15 % ophthalmic solution Place 1 drop into both eyes 2 (two) times daily. 04/11/15   [provider]  ciclopirox (PENLAC) 8 % solution Apply topically at bedtime. Apply over nail and surrounding skin. Apply daily over previous coat. After seven (7) days, may remove with alcohol and continue cycle. 11/21/20   Felipa Furnace, DPM  dorzolamide-timolol (COSOPT) 22.3-6.8 MG/ML  ophthalmic solution 1 drop 2 (two) times daily.    [provider]  JARDIANCE 25 MG TABS tablet Take 25 mg by mouth daily. 11/06/20   [provider]  levothyroxine (SYNTHROID) 100 MCG tablet Take 100 mcg by mouth daily before breakfast.    [provider]   losartan-hydrochlorothiazide (HYZAAR) 100-25 MG tablet losartan 100 mg-hydrochlorothiazide 25 mg tablet    [provider]  LUMIGAN 0.01 % SOLN Place 1 drop into both eyes at bedtime. 04/11/15   [provider]  metFORMIN (GLUCOPHAGE) 500 MG tablet Take by mouth 2 (two) times daily with a meal.    [provider]  Multiple Vitamin (MULTIVITAMIN) tablet Take 1 tablet by mouth daily.    [provider]  omeprazole (PRILOSEC) 40 MG capsule Take 40 mg by mouth in the morning and at bedtime.    [provider]  pilocarpine (PILOCAR) 4 % ophthalmic solution Place 2 drops into both eyes 4 (four) times daily.    [provider]  simvastatin (ZOCOR) 20 MG tablet Take 1 tablet by mouth at bedtime. 06/15/15   [provider]  timolol (TIMOPTIC) 0.5 % ophthalmic solution Place 1 drop into both eyes 2 (two) times daily. 04/11/15   [provider]  valsartan-hydrochlorothiazide (DIOVAN-HCT) 320-25 MG tablet Take 1 tablet by mouth daily. 04/13/15 01/23/20  [provider]    Family History Family History  Problem Relation Age of Onset   Heart Problems Mother    Cancer Father    Diabetes Other    Breast cancer Neg Hx     Social History Social History   Tobacco Use   Smoking status: Never   Smokeless tobacco: Never  Vaping Use   Vaping Use: Never used  Substance Use Topics   Alcohol use: No   Drug use: No     Allergies   Codeine   Review of Systems Review of Systems  Constitutional:  Negative for appetite change, chills and fever.  HENT:  Positive for rhinorrhea. Negative for congestion, ear pain, sinus pressure, sinus pain and sore throat.   Eyes:  Negative for pain and redness.  Respiratory:  Positive for cough. Negative for chest tightness and shortness of breath.   Cardiovascular:  Negative for chest pain.  Gastrointestinal:  Positive for abdominal pain and diarrhea. Negative for nausea and vomiting.   Genitourinary:  Negative for difficulty urinating, dysuria, frequency, hematuria and urgency.  Skin:  Negative for rash.  Neurological:  Negative for dizziness, weakness, light-headedness and headaches.     Physical Exam Triage Vital Signs ED Triage Vitals  Enc Vitals Group     BP 08/03/22 0910 (!) 148/75     Pulse Rate 08/03/22 0910 84     Resp 08/03/22 0910 14     Temp 08/03/22 0910 98.2 F (36.8 C)     Temp Source 08/03/22 0910 Oral     SpO2 08/03/22 0910 99 %     Weight 08/03/22 0908 144 lb (65.3 kg)     Height 08/03/22 0908 '5\' 4"'$  (1.626 m)     Head Circumference --      Peak Flow --      Pain Score 08/03/22 0908 0     Pain Loc --      Pain Edu? --      Excl. in [place]? --    No data found.  Updated Vital Signs BP (!) 148/75 (BP Location: Right Arm)   Pulse 84   Temp 98.2  F (36.8 C) (Oral)   Resp 14   Ht '5\' 4"'$  (1.626 m)   Wt 144 lb (65.3 kg)   SpO2 99%   BMI 24.72 kg/m       Physical Exam Constitutional:      Appearance: Normal appearance.  HENT:     Head: Normocephalic.     Comments: No sinus tenderness noted.    Mouth/Throat:     Mouth: Mucous membranes are moist.     Pharynx: No oropharyngeal exudate or posterior oropharyngeal erythema.  Eyes:     Extraocular Movements: Extraocular movements intact.     Conjunctiva/sclera: Conjunctivae normal.     Pupils: Pupils are equal, round, and reactive to light.  Cardiovascular:     Rate and Rhythm: Normal rate and regular rhythm.  Pulmonary:     Effort: Pulmonary effort is normal.     Breath sounds: Normal breath sounds. No wheezing, rhonchi or rales.  Abdominal:     General: Abdomen is flat. There is no distension.     Palpations: Abdomen is soft.     Tenderness: There is no abdominal tenderness. There is no right CVA tenderness or left CVA tenderness.  Lymphadenopathy:     Cervical: No cervical adenopathy.  Skin:    General: Skin is warm and dry.     Findings: No rash.  Neurological:     Mental  Status: She is alert and oriented to person, place, and time.      UC Treatments / Results   Medications Ordered in UC Medications - No data to display  Initial Impression / Assessment and Plan / UC Course  I have reviewed the triage vital signs and the nursing notes.  Pertinent labs & imaging results that were available during my care of the patient were reviewed by me and considered in my medical decision making (see chart for details).     86 year old female with 1 day history of LUQ/left sided rib pain.  No evidence of rash that would suggest shingles.  She has diarrhea but this is a chronic issue and not related to her current symptoms.  She has no evidence of viral GI infection.  She is denying urinary or vaginal symptoms.  She reports intermittent runny nose and cough but again this is a chronic issue and not necessarily related to her current symptoms.  She has no indication to be tested for COVID or flu at this time.  Aced on exam, there is no viral or bacterial respiratory infection.  Will try Lidoderm patches to the affected area.  Advised her to follow-up with her PCP if symptoms persist or worsen.  Final Clinical Impressions(s) / UC Diagnoses   Final diagnoses:  Rib pain on left side     Discharge Instructions      You were seen today for sharp pains on your left side.  This could be muscle or nerve related.  We are trying Lidoderm patches for you to use 12 hours on and 12 hours off.  If you develop a rash in this area, we would recommend reevaluation.  You were not tested for COVID or flu as you do not have symptoms suggestive of that.  Please follow-up with your PCP if symptoms persist or worsens.     ED Prescriptions     Medication Sig Dispense Auth. Provider   lidocaine (LIDODERM) 5 % Place 1 patch onto the skin daily. Remove & Discard patch within 12 hours or as directed by MD 30 patch  Jearld Fenton, NP      PDMP not reviewed this encounter.   Jearld Fenton, NP 08/03/22 [phone]

## 2022-08-03 NOTE — ED Triage Notes (Signed)
Patient c/o cough, runny nose, and bodyaches that started a week ago.

## 2022-08-03 NOTE — Discharge Instructions (Signed)
You were seen today for sharp pains on your left side.  This could be muscle or nerve related.  We are trying Lidoderm patches for you to use 12 hours on and 12 hours off.  If you develop a rash in this area, we would recommend reevaluation.  You were not tested for COVID or flu as you do not have symptoms suggestive of that.  Please follow-up with your PCP if symptoms persist or worsens.

## 2022-10-27 ENCOUNTER — Other Ambulatory Visit: Payer: Self-pay

## 2022-10-27 ENCOUNTER — Emergency Department: Payer: Medicare PPO

## 2022-10-27 ENCOUNTER — Emergency Department
Admission: EM | Admit: 2022-10-27 | Discharge: 2022-10-27 | Disposition: A | Discharge status: Home / Self Care | Payer: Medicare PPO | Attending: Emergency Medicine | Admitting: Emergency Medicine

## 2022-10-27 DIAGNOSIS — E119 Type 2 diabetes mellitus without complications: Secondary | ICD-10-CM | POA: Insufficient documentation

## 2022-10-27 DIAGNOSIS — R110 Nausea: Secondary | ICD-10-CM | POA: Insufficient documentation

## 2022-10-27 DIAGNOSIS — R42 Dizziness and giddiness: Secondary | ICD-10-CM | POA: Insufficient documentation

## 2022-10-27 DIAGNOSIS — E039 Hypothyroidism, unspecified: Secondary | ICD-10-CM | POA: Insufficient documentation

## 2022-10-27 DIAGNOSIS — I10 Essential (primary) hypertension: Secondary | ICD-10-CM | POA: Insufficient documentation

## 2022-10-27 DIAGNOSIS — R197 Diarrhea, unspecified: Secondary | ICD-10-CM | POA: Insufficient documentation

## 2022-10-27 DIAGNOSIS — R1084 Generalized abdominal pain: Secondary | ICD-10-CM | POA: Diagnosis present

## 2022-10-27 LAB — COMPREHENSIVE METABOLIC PANEL
ALT: 14 U/L (ref 0–44)
AST: 24 U/L (ref 15–41)
Albumin: 3.6 g/dL (ref 3.5–5.0)
Alkaline Phosphatase: 64 U/L (ref 38–126)
Anion gap: 7 (ref 5–15)
BUN: 16 mg/dL (ref 8–23)
CO2: 26 mmol/L (ref 22–32)
Calcium: 9.1 mg/dL (ref 8.9–10.3)
Chloride: 104 mmol/L (ref 98–111)
Creatinine, Ser: 0.96 mg/dL (ref 0.44–1.00)
GFR, Estimated: 58 mL/min — ABNORMAL LOW (ref >60)
Glucose, Bld: 209 mg/dL — ABNORMAL HIGH (ref 70–99)
Potassium: 3.3 mmol/L — ABNORMAL LOW (ref 3.5–5.1)
Sodium: 137 mmol/L (ref 135–145)
Total Bilirubin: 0.5 mg/dL (ref 0.3–1.2)
Total Protein: 8.1 g/dL (ref 6.5–8.1)

## 2022-10-27 LAB — URINALYSIS, ROUTINE W REFLEX MICROSCOPIC
Bacteria, UA: NONE SEEN
Bilirubin Urine: NEGATIVE
Glucose, UA: >=500 mg/dL — AB
Hgb urine dipstick: NEGATIVE
Ketones, ur: NEGATIVE mg/dL
Leukocytes,Ua: NEGATIVE
Nitrite: NEGATIVE
Protein, ur: NEGATIVE mg/dL
Specific Gravity, Urine: 1.038 — ABNORMAL HIGH (ref 1.005–1.030)
WBC, UA: NONE SEEN WBC/hpf (ref 0–5)
pH: 7 (ref 5.0–8.0)

## 2022-10-27 LAB — CBC
HCT: 37.8 % (ref 36.0–46.0)
Hemoglobin: 11.4 g/dL — ABNORMAL LOW (ref 12.0–15.0)
MCH: 26.2 pg (ref 26.0–34.0)
MCHC: 30.2 g/dL (ref 30.0–36.0)
MCV: 86.9 fL (ref 80.0–100.0)
Platelets: 310 10*3/uL (ref 150–400)
RBC: 4.35 MIL/uL (ref 3.87–5.11)
RDW: 14 % (ref 11.5–15.5)
WBC: 6.6 10*3/uL (ref 4.0–10.5)
nRBC: 0 % (ref 0.0–0.2)

## 2022-10-27 LAB — LIPASE, BLOOD: Lipase: 53 U/L — ABNORMAL HIGH (ref 11–51)

## 2022-10-27 MED ORDER — KETOROLAC TROMETHAMINE 15 MG/ML IJ SOLN
15.0000 mg | Freq: Once | INTRAMUSCULAR | Status: AC
Start: 2022-10-27 — End: 2022-10-27
  Administered 2022-10-27: 15 mg via INTRAVENOUS
  Filled 2022-10-27: qty 1

## 2022-10-27 MED ORDER — ONDANSETRON HCL 4 MG PO TABS: 4.0000 mg | ORAL_TABLET | Freq: Every day | ORAL | 1 refills | Status: AC | PRN

## 2022-10-27 MED ORDER — IOHEXOL 300 MG/ML  SOLN
100.0000 mL | Freq: Once | INTRAMUSCULAR | Status: AC | PRN
  Administered 2022-10-27: 100 mL via INTRAVENOUS

## 2022-10-27 MED ORDER — SODIUM CHLORIDE 0.9 % IV BOLUS
1000.0000 mL | Freq: Once | INTRAVENOUS | Status: AC
  Administered 2022-10-27: 1000 mL via INTRAVENOUS

## 2022-10-27 MED ORDER — ACETAMINOPHEN 500 MG PO TABS
1000.0000 mg | ORAL_TABLET | Freq: Once | ORAL | Status: AC
  Administered 2022-10-27: 1000 mg via ORAL
  Filled 2022-10-27: qty 2

## 2022-10-27 MED ORDER — ONDANSETRON HCL 4 MG/2ML IJ SOLN
4.0000 mg | Freq: Once | INTRAMUSCULAR | Status: AC
  Administered 2022-10-27: 4 mg via INTRAVENOUS
  Filled 2022-10-27: qty 2

## 2022-10-27 NOTE — ED Triage Notes (Signed)
Pt comes with all over belly pain that started this am. Pt states kinda diarrhea. Pt denies any N/V

## 2022-10-27 NOTE — Discharge Instructions (Addendum)
Stay well-hydrated by drinking plenty of fluids.  Find Pedialyte or similar electrolyte rehydration formulas at your local pharmacy.  Take Zofran for nausea as needed.  There were some incidental findings including a lung nodule and a mass in your abdomen that you should follow-up with your doctor to discuss further evaluation as needed.  Take Tylenol 650 mg every 6 hours for pain.  Thank you for choosing [place] for your health care today!  Please see your primary doctor this week for a follow up appointment.   Sometimes, in the early stages of certain disease courses it is difficult to detect in the emergency department evaluation -- so, it is important that you continue to monitor your symptoms and call your doctor right away or return to the emergency department if you develop any new or worsening symptoms.  Please go to the following website to schedule new (and existing) patient appointments:   [email or web address]  If you do not have a primary doctor try calling the following clinics to establish care:  If you have insurance:  [facility] [phone] [place] [address]   [facility]  [phone] [place] [address]   If you do not have insurance:  [facility]  [phone] [address], [place] [address]   The following is another list of primary care offices in the area who are accepting new patients at this time.  Please reach out to one of them directly and let them know you would like to schedule an appointment to follow up on an Emergency Department visit, and/or to establish a new primary care provider (PCP).  There are likely other primary care clinics in the are who are accepting new patients, but this is an excellent place to start:  [place] physician: Dr Lavon Paganini [address] [place], [place]  [address] [phone]  [facility] Lead Physician: Dr Steele Sizer [address], [place], [place] [address] [phone]  [place] Physician: Dr Park Liter [address] [place], [place] [address] [phone]  [facility] Lead Physician: Dr Dewaine Oats [place], [place], [place] [address] [phone]  [facility] Physician: Dr Halina Maidens [address] [place] [place], [place] [address] [phone]   It was my pleasure to care for you today.   Hoover Brunette Jacelyn Grip, MD

## 2022-10-27 NOTE — ED Provider Notes (Signed)
[facility] Provider Note    Event Date/Time   First MD Initiated Contact with Patient 10/27/22 [phone]     (approximate)   History   Abdominal Pain   HPI  Heather Small is a 87 y.o. female   Past medical history of diabetes, hypertension, hyperlipidemia, hypothyroid, presents to the emergency department with diffuse abdominal pain starting last night as well as watery diarrhea, no bleeding or melena.  No urinary symptoms.  Nausea but no vomiting.  No fever or chills.  No trauma.  No history of abdominal surgeries.  She denies chest pain, shortness of breath, respiratory symptoms, GU symptoms.  No other acute medical complaints.  Independent Historian contributed to assessment above: Daughter who is at bedside  External Medical Documents Reviewed: Colonoscopy from 2021 with sigmoid polyps, and an upper endoscopy from 2020 with esophageal stricture status post dilation      Physical Exam   Triage Vital Signs: ED Triage Vitals  Enc Vitals Group     BP 10/27/22 0723 119/64     Pulse Rate 10/27/22 0723 75     Resp 10/27/22 0723 18     Temp 10/27/22 0723 98 F (36.7 C)     Temp src --      SpO2 10/27/22 0723 100 %     Weight --      Height --      Head Circumference --      Peak Flow --      Pain Score 10/27/22 0722 9     Pain Loc --      Pain Edu? --      Excl. in [place]? --     Most recent vital signs: Vitals:   10/27/22 0723 10/27/22 1035  BP: 119/64 125/73  Pulse: 75 88  Resp: 18   Temp: 98 F (36.7 C)   SpO2: 100% 95%    General: Awake, no distress.  CV:  Good peripheral perfusion.  Resp:  Normal effort.  Abd:  No distention.  Other:  Abdomen is soft but is tender to palpation in all quadrants.  No rigidity or guarding.  Hemodynamics appropriate reassuring she is afebrile.  She is awake alert oriented and cooperative.   ED Results / Procedures / Treatments   Labs (all labs ordered are listed, but only abnormal results are  displayed) Labs Reviewed  LIPASE, BLOOD - Abnormal; Notable for the following components:      Result Value   Lipase 53 (*)    All other components within normal limits  COMPREHENSIVE METABOLIC PANEL - Abnormal; Notable for the following components:   Potassium 3.3 (*)    Glucose, Bld 209 (*)    GFR, Estimated 58 (*)    All other components within normal limits  CBC - Abnormal; Notable for the following components:   Hemoglobin 11.4 (*)    All other components within normal limits  URINALYSIS, ROUTINE W REFLEX MICROSCOPIC - Abnormal; Notable for the following components:   Color, Urine STRAW (*)    APPearance CLEAR (*)    Specific Gravity, Urine 1.038 (*)    Glucose, UA >=500 (*)    All other components within normal limits     I ordered and reviewed the above labs they are notable for urinalysis without bacteria or leukocytes.  RADIOLOGY I independently reviewed and interpreted CT scan of the abdomen pelvis and see no obvious obstructive or inflammatory processes   PROCEDURES:  Critical Care performed: No  Procedures  MEDICATIONS ORDERED IN ED: Medications  sodium chloride 0.9 % bolus 1,000 mL (1,000 mLs Intravenous New Bag/Given 10/27/22 0835)  ondansetron (ZOFRAN) injection 4 mg (4 mg Intravenous Given 10/27/22 0833)  ketorolac (TORADOL) 15 MG/ML injection 15 mg (15 mg Intravenous Given 10/27/22 0832)  acetaminophen (TYLENOL) tablet 1,000 mg (1,000 mg Oral Given 10/27/22 0836)  iohexol (OMNIPAQUE) 300 MG/ML solution 100 mL (100 mLs Intravenous Contrast Given 10/27/22 0857)    IMPRESSION / MDM / ASSESSMENT AND PLAN / ED COURSE  I reviewed the triage vital signs and the nursing notes.                                Patient's presentation is most consistent with acute presentation with potential threat to life or bodily function.  Differential diagnosis includes, but is not limited to, appendicitis, cholecystitis, pancreatitis, urinary tract infection, colitis or  gastroenteritis, mesenteric ischemia, AAA   The patient is on the cardiac monitor to evaluate for evidence of arrhythmia and/or significant heart rate changes.  MDM: This is a 87 year old patient with abdominal pain and tenderness to palpation concerning for the above differential diagnosis will obtain a CT scan of the abdomen pelvis to assess for surgical abdominal pathologies, as well as basic labs, LFT, lipase and urinalysis.  She has some lightheadedness with standing this morning from profuse diarrhea likely some element of dehydration I will give IV crystalloid bolus as well as IV antiemetic Zofran IV Toradol and Tylenol for pain.  Avoid morphine given prior severe allergy to codeine, and she has never gotten morphine before.  I considered hospitalization for admission or observation however given negative CT scan for acute intra-abdominal pathologies, stability in the emergency department with normal labs, I think outpatient follow-up is most appropriate at this time.  I let the patient know about the incidental findings of the mass in the cecum as well as pulmonary nodule to have her follow-up with PMD for further evaluation as needed.  Strict return precautions were given for any new or worsening symptoms.        FINAL CLINICAL IMPRESSION(S) / ED DIAGNOSES   Final diagnoses:  Generalized abdominal pain  Nausea  Diarrhea, unspecified type     Rx / DC Orders   ED Discharge Orders          Ordered    ondansetron (ZOFRAN) 4 MG tablet  Daily PRN        10/27/22 1058             Note:  This document was prepared using Dragon voice recognition software and may include unintentional dictation errors.    Lucillie Garfinkel, MD 10/27/22 1100

## 2022-10-29 DIAGNOSIS — K810 Acute cholecystitis: Secondary | ICD-10-CM | POA: Insufficient documentation

## 2023-02-11 ENCOUNTER — Other Ambulatory Visit: Payer: Self-pay

## 2023-02-17 ENCOUNTER — Ambulatory Visit: Payer: Medicare PPO | Admitting: Gastroenterology

## 2023-09-17 IMAGING — MG MM DIGITAL SCREENING UNILAT*R* W/ TOMO W/ CAD
6 series · 6 of 18 positions shown · non-contrast
Comparison: Previous exam(s).

CLINICAL DATA: Screening. Personal history of malignant LEFT
mastectomy.

EXAM:
DIGITAL SCREENING UNILATERAL RIGHT MAMMOGRAM WITH CAD AND
TOMOSYNTHESIS
TECHNIQUE: Right screening digital craniocaudal and mediolateral oblique
mammograms were obtained. Right screening digital breast
tomosynthesis was performed. The images were evaluated with
computer-aided detection.

[R MLO synth-2D (1 of 2)]
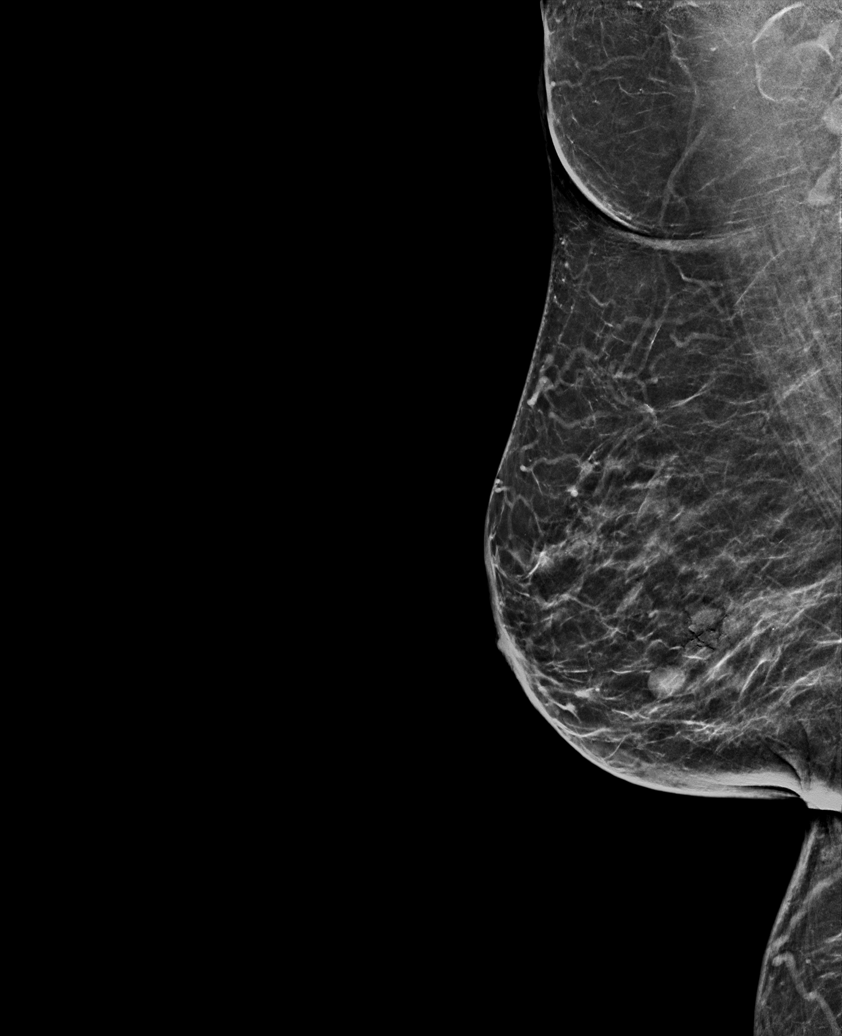

[R CC synth-2D]
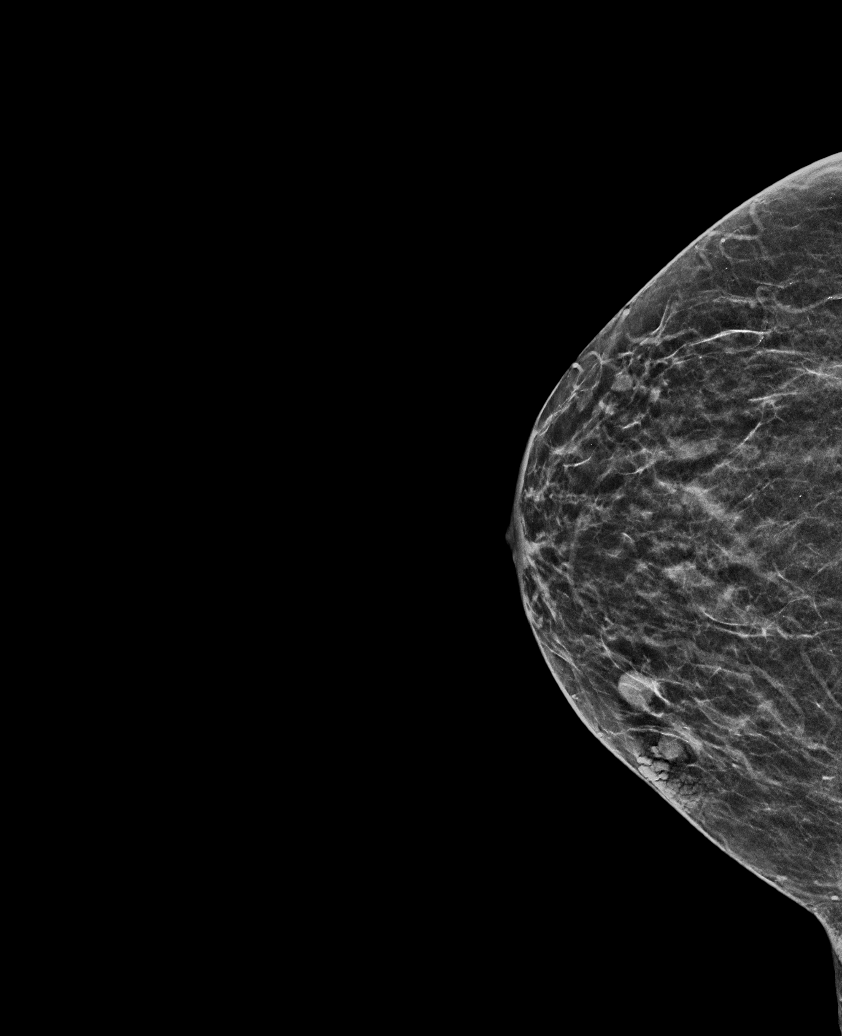

[R MLO synth-2D (2 of 2)]
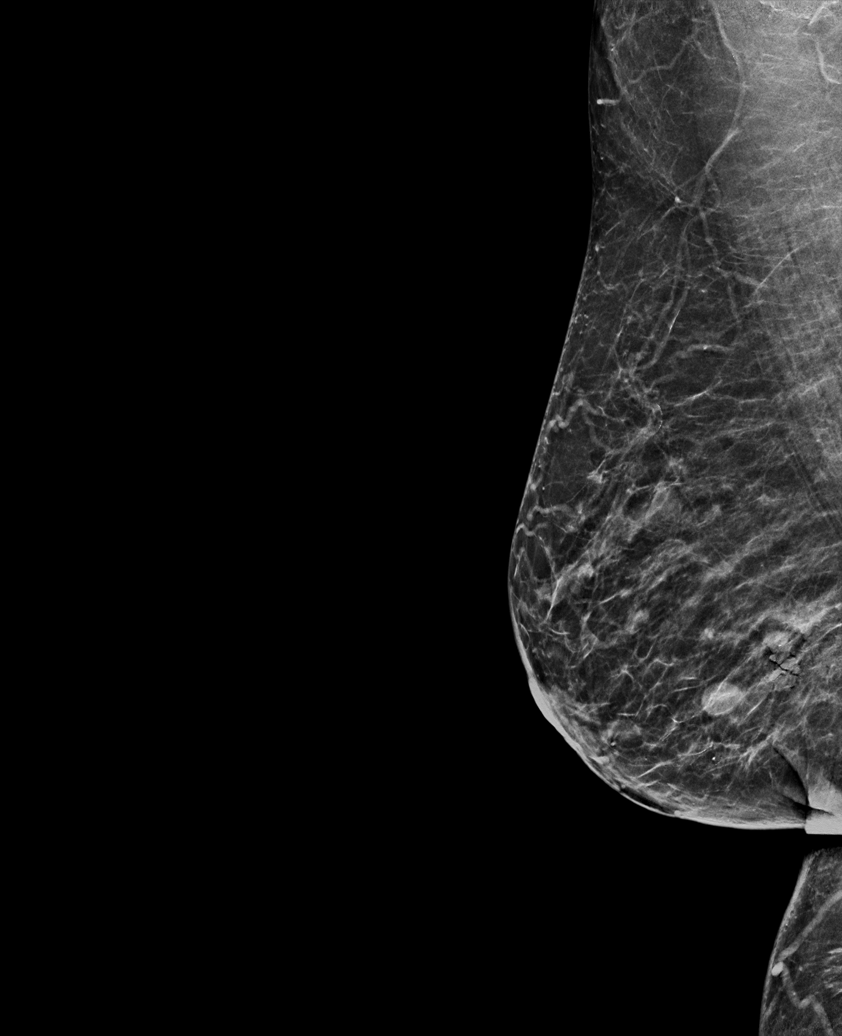

[R CC tomo · tomo slice 24/47.0]
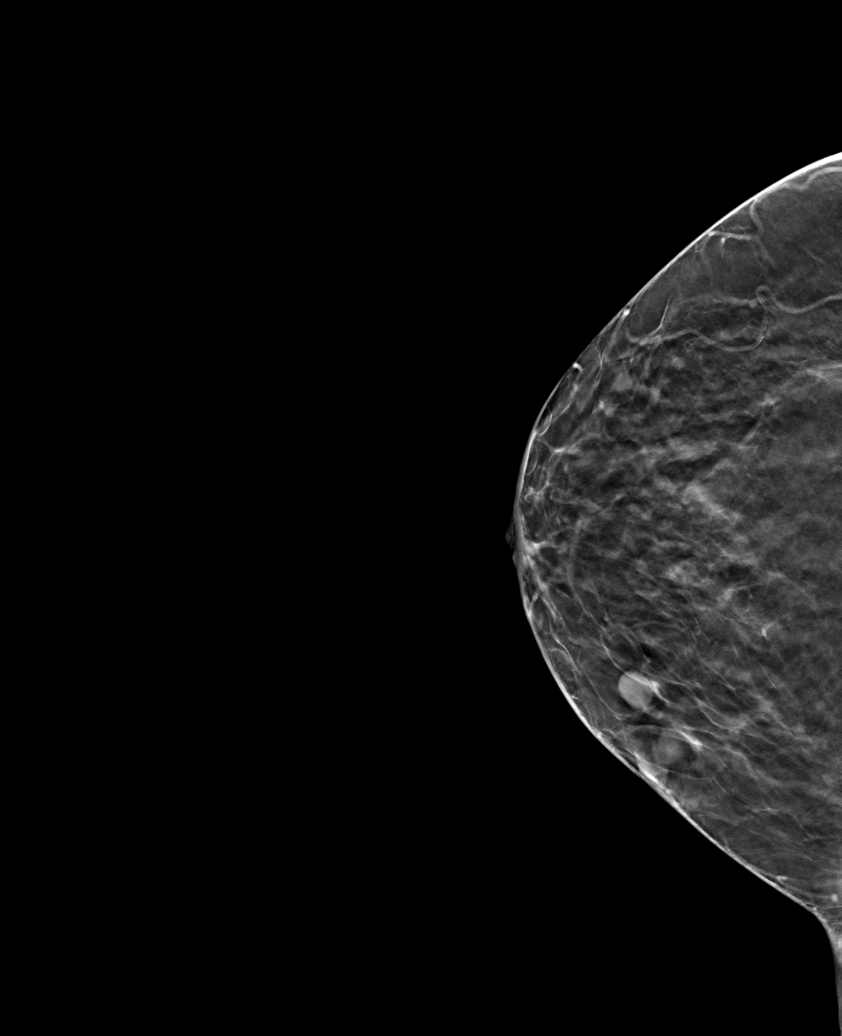

[R MLO tomo (1 of 2) · tomo slice 28/55.0]
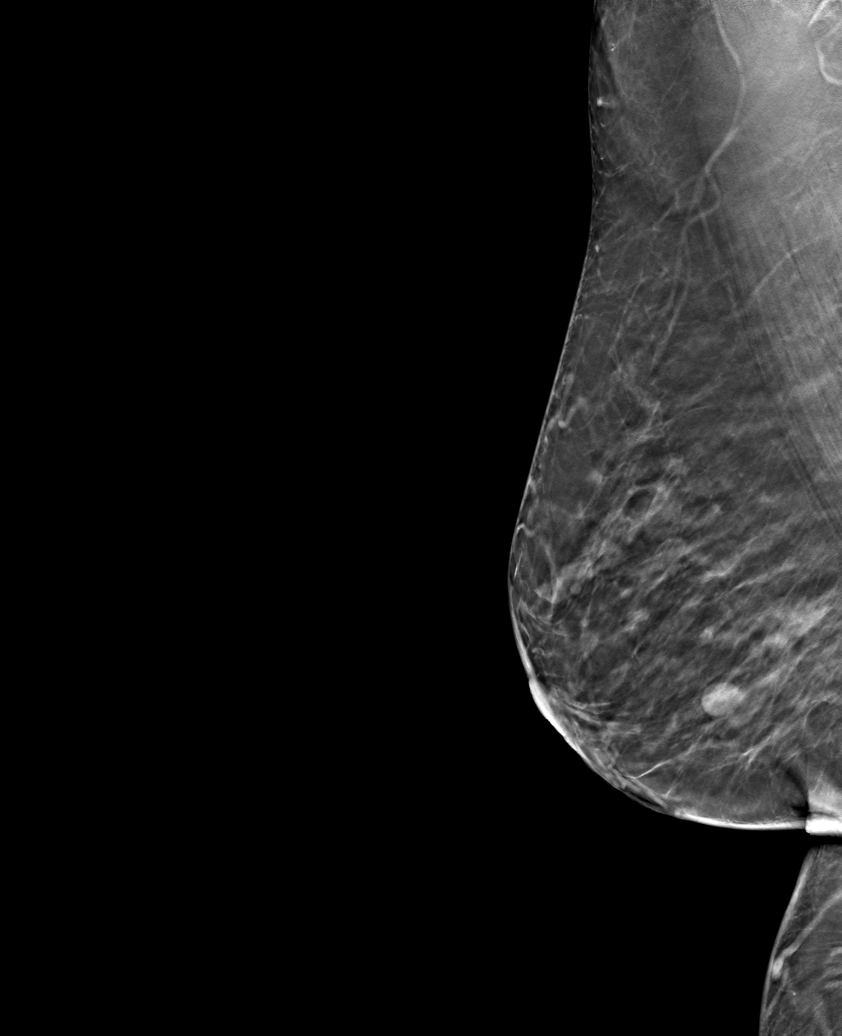

[R MLO tomo (2 of 2) · tomo slice 30/59.0]
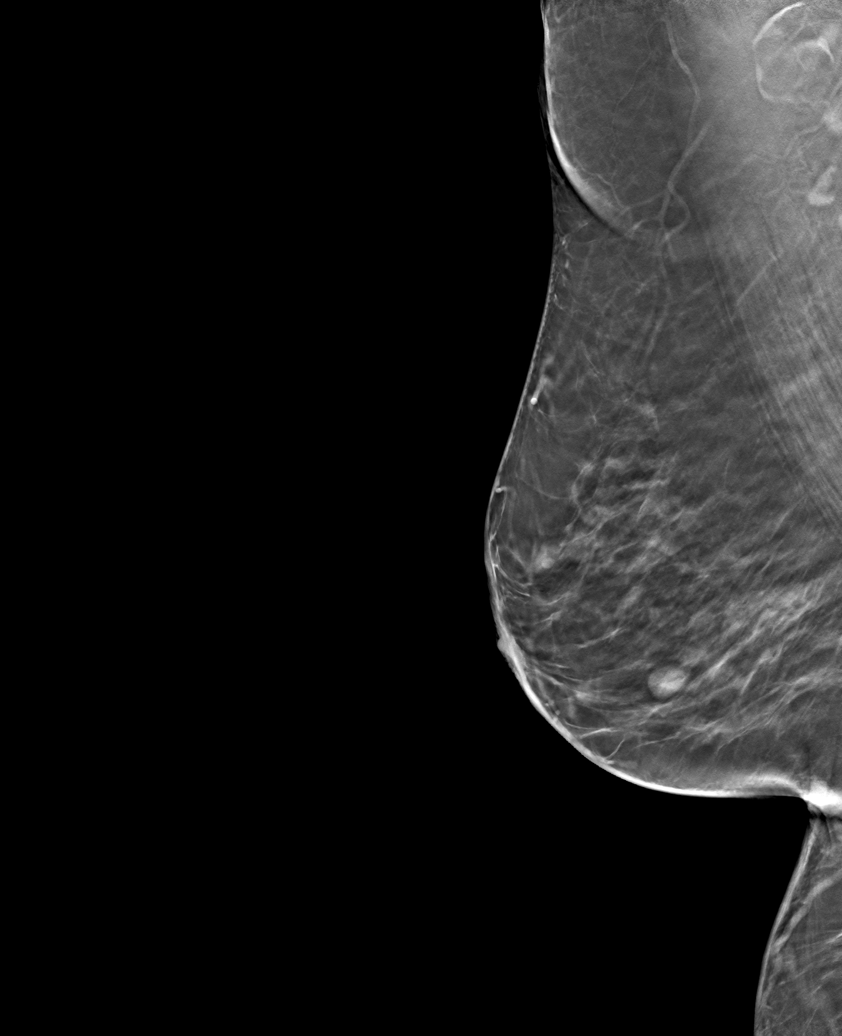

[6 of 18 positions shown; findings below may reference images not displayed]

ACR Breast Density Category b: There are scattered areas of
fibroglandular density.
FINDINGS: In the right breast, a possible mass warrants further evaluation.
The patient has undergone prior left mastectomy.
IMPRESSION: Further evaluation is suggested for a possible mass in the right
breast.

RECOMMENDATION:
Ultrasound of the right breast. (Code:[id number])

The patient will be contacted regarding the findings, and additional
imaging will be scheduled.

BI-RADS CATEGORY  0: Incomplete. Need additional imaging evaluation
and/or prior mammograms for comparison.

## 2023-11-14 ENCOUNTER — Ambulatory Visit (INDEPENDENT_AMBULATORY_CARE_PROVIDER_SITE_OTHER): Admitting: Podiatry

## 2023-11-14 DIAGNOSIS — B353 Tinea pedis: Secondary | ICD-10-CM | POA: Diagnosis not present

## 2023-11-14 DIAGNOSIS — M2041 Other hammer toe(s) (acquired), right foot: Secondary | ICD-10-CM

## 2023-11-14 DIAGNOSIS — M2012 Hallux valgus (acquired), left foot: Secondary | ICD-10-CM

## 2023-11-14 DIAGNOSIS — E1142 Type 2 diabetes mellitus with diabetic polyneuropathy: Secondary | ICD-10-CM

## 2023-11-14 DIAGNOSIS — M2011 Hallux valgus (acquired), right foot: Secondary | ICD-10-CM

## 2023-11-14 DIAGNOSIS — B351 Tinea unguium: Secondary | ICD-10-CM | POA: Diagnosis not present

## 2023-11-14 DIAGNOSIS — M79674 Pain in right toe(s): Secondary | ICD-10-CM

## 2023-11-14 DIAGNOSIS — M2042 Other hammer toe(s) (acquired), left foot: Secondary | ICD-10-CM

## 2023-11-14 DIAGNOSIS — M79675 Pain in left toe(s): Secondary | ICD-10-CM

## 2023-11-14 MED ORDER — KETOCONAZOLE 2 % EX CREA: TOPICAL_CREAM | CUTANEOUS | 0 refills | Status: DC

## 2023-11-14 NOTE — Patient Instructions (Signed)
 To prevent reinfection, spray shoes with lysol every evening.  Clean tub or shower with bleach based cleanser.  Athlete's Foot Athlete's foot (tinea pedis) is a fungal infection of the skin on your feet. It often occurs on the skin that is between or underneath the toes. It can also occur on the soles of your feet. The infection can spread from person to person (is contagious). It can also spread when a person's bare feet come in contact with the fungus on shower floors or on items such as shoes. What are the causes? This condition is caused by a fungus that grows in warm, moist places. You can get athlete's foot by sharing shoes, shower stalls, towels, and wet floors with someone who is infected. Not washing your feet or changing your socks often enough can also lead to athlete's foot. What increases the risk? This condition is more likely to develop in: Men. People who have a weak body defense system (immune system). People who have diabetes. People who use public showers, such as at a gym. People who wear heavy-duty shoes, such as industrial or military shoes. Seasons with warm, humid weather. What are the signs or symptoms? Symptoms of this condition include: Itchy areas between your toes or on the soles of your feet. White, flaky, or scaly areas between your toes or on the soles of your feet. Very itchy small blisters between your toes or on the soles of your feet. Small cuts in your skin. These cuts can become infected. Thick or discolored toenails. How is this diagnosed? This condition may be diagnosed with a physical exam and a review of your medical history. Your health care provider may also take a skin or toenail sample to examine under a microscope. How is this treated? This condition is treated with antifungal medicines. These may be applied as powders, ointments, or creams. In severe cases, an oral antifungal medicine may be given. Follow these instructions at  home: Medicines Apply or take over-the-counter and prescription medicines only as told by your health care provider. Apply your antifungal medicine as told by your health care provider. Do not stop using the antifungal even if your condition improves. Foot care Do not scratch your feet. Keep your feet dry: Wear cotton or wool socks. Change your socks every day or if they become wet. Wear shoes that allow air to flow, such as sandals or canvas tennis shoes. Wash and dry your feet, including the area between your toes. Also, wash and dry your feet: Every day or as told by your health care provider. After exercising. General instructions Do not let others use towels, shoes, nail clippers, or other personal items that touch your feet. Protect your feet by wearing sandals in wet areas, such as locker rooms and shared showers. Keep all follow-up visits. This is important. If you have diabetes, keep your blood sugar under control. Contact a health care provider if: You have a fever. You have swelling, soreness, warmth, or redness in your foot. Your feet are not getting better with treatment. Your symptoms get worse. You have new symptoms. You have severe pain. Summary Athlete's foot (tinea pedis) is a fungal infection of the skin on your feet. It often occurs on skin that is between or underneath the toes. This condition is caused by a fungus that grows in warm, moist places. Symptoms include white, flaky, or scaly areas between your toes or on the soles of your feet. This condition is treated with antifungal medicines.   Keep your feet clean. Always dry them thoroughly. This information is not intended to replace advice given to you by your health care provider. Make sure you discuss any questions you have with your health care provider. Document Revised: 11/19/2020 Document Reviewed: 11/19/2020 Elsevier Patient Education  2024 Elsevier Inc.  

## 2023-11-20 ENCOUNTER — Encounter: Payer: Self-pay | Admitting: Podiatry

## 2023-11-20 NOTE — Progress Notes (Signed)
 ANNUAL DIABETIC FOOT EXAM  Subjective: Heather Small presents today for annual diabetic foot exam.  Patient is unsure of her last A1c.  Patient confirms h/o diabetes.  Patient denies any h/o foot wounds.  Patient has been diagnosed with neuropathy.  Heather Bonus, MD is patient's PCP. LOV 2-3 months ago.  Past Medical History:  Diagnosis Date   Breast cancer (HCC)    Diabetes mellitus without complication (HCC)    Hyperlipemia    Hypertension    Hypothyroidism    Personal history of radiation therapy    Thyroid disease    Patient Active Problem List   Diagnosis Date Noted   Cholecystitis, acute 10/29/2022   Septic shock due to Escherichia coli (HCC) 04/26/2020   Normocytic anemia 04/26/2020   Type 2 diabetes mellitus without complication (HCC) 04/25/2020   Essential hypertension 04/25/2020   HLD (hyperlipidemia) 04/25/2020   Hypothyroidism 04/25/2020   Cecum mass 04/25/2020   Sepsis (HCC) 02/18/2017   History of CVA (cerebrovascular accident) 07/01/2015   Postmenopausal bleeding 08/16/2014   Hypothyroidism, postablative 11/15/2013   Fibroids 03/11/2013   Abnormal pelvic exam 12/31/2012   Midline cystocele 12/31/2012   Urge incontinence 12/31/2012   Vaginal polyp 12/31/2012   Esophageal stricture 06/23/2012   Candida esophagitis (HCC) 06/23/2012   Nausea with vomiting 12/09/2011   Weight loss 12/09/2011   Past Surgical History:  Procedure Laterality Date   COLONOSCOPY     COLONOSCOPY WITH PROPOFOL N/A 04/28/2020   Procedure: COLONOSCOPY WITH PROPOFOL;  Surgeon: Wyline Mood, MD;  Location: [facility] ENDOSCOPY;  Service: Gastroenterology;  Laterality: N/A;   ESOPHAGEAL DILATION     ESOPHAGOGASTRODUODENOSCOPY (EGD) WITH PROPOFOL N/A 07/13/2018   Procedure: ESOPHAGOGASTRODUODENOSCOPY (EGD) WITH PROPOFOL;  Surgeon: Wyline Mood, MD;  Location: [facility] ENDOSCOPY;  Service: Gastroenterology;  Laterality: N/A;   ESOPHAGOGASTRODUODENOSCOPY (EGD) WITH PROPOFOL N/A 07/30/2018    Procedure: ESOPHAGOGASTRODUODENOSCOPY (EGD) WITH PROPOFOL;  Surgeon: Wyline Mood, MD;  Location: [facility] ENDOSCOPY;  Service: Gastroenterology;  Laterality: N/A;   ESOPHAGOGASTRODUODENOSCOPY (EGD) WITH PROPOFOL N/A 08/18/2018   Procedure: EGD with Dilation;  Surgeon: Wyline Mood, MD;  Location: [facility] ENDOSCOPY;  Service: Gastroenterology;  Laterality: N/A;   ESOPHAGOGASTRODUODENOSCOPY (EGD) WITH PROPOFOL N/A 09/29/2018   Procedure: ESOPHAGOGASTRODUODENOSCOPY (EGD) WITH PROPOFOL;  Surgeon: Wyline Mood, MD;  Location: [facility] ENDOSCOPY;  Service: Gastroenterology;  Laterality: N/A;   MASTECTOMY Left    Current Outpatient Medications on File Prior to Visit  Medication Sig Dispense Refill   amLODipine (NORVASC) 5 MG tablet Take 5 mg by mouth daily.     brimonidine (ALPHAGAN) 0.15 % ophthalmic solution Place 1 drop into both eyes 2 (two) times daily.     ciclopirox (PENLAC) 8 % solution Apply topically at bedtime. Apply over nail and surrounding skin. Apply daily over previous coat. After seven (7) days, may remove with alcohol and continue cycle. 6.6 mL 2   dorzolamide (TRUSOPT) 2 % ophthalmic solution Place 1 drop into both eyes 2 (two) times daily.     dorzolamide-timolol (COSOPT) 22.3-6.8 MG/ML ophthalmic solution 1 drop 2 (two) times daily.     gabapentin (NEURONTIN) 100 MG capsule Take 1 capsule by mouth 3 (three) times daily.     JARDIANCE 25 MG TABS tablet Take 25 mg by mouth daily.     levothyroxine (SYNTHROID) 100 MCG tablet Take 100 mcg by mouth daily before breakfast.     lidocaine (LIDODERM) 5 % Place 1 patch onto the skin daily. Remove & Discard patch within 12 hours or as directed by MD  30 patch 0   losartan-hydrochlorothiazide (HYZAAR) 100-25 MG tablet losartan 100 mg-hydrochlorothiazide 25 mg tablet     LUMIGAN 0.01 % SOLN Place 1 drop into both eyes at bedtime.     metFORMIN (GLUCOPHAGE) 500 MG tablet Take by mouth 2 (two) times daily with a meal.     Multiple Vitamin (MULTIVITAMIN) tablet  Take 1 tablet by mouth daily.     omeprazole (PRILOSEC) 40 MG capsule Take 40 mg by mouth in the morning and at bedtime.     pilocarpine (PILOCAR) 4 % ophthalmic solution Place 2 drops into both eyes 4 (four) times daily.     potassium chloride SA (KLOR-CON M) 20 MEQ tablet Take 20 mEq by mouth daily.     simvastatin (ZOCOR) 20 MG tablet Take 1 tablet by mouth at bedtime.     timolol (TIMOPTIC) 0.5 % ophthalmic solution Place 1 drop into both eyes 2 (two) times daily.     TRUEplus Lancets 33G MISC      [DISCONTINUED] valsartan-hydrochlorothiazide (DIOVAN-HCT) 320-25 MG tablet Take 1 tablet by mouth daily.     No current facility-administered medications on file prior to visit.    Allergies  Allergen Reactions   Codeine Anaphylaxis, Other (See Comments) and Swelling    Couldn't swallow, was rushed to hospital   Social History   Occupational History   Not on file  Tobacco Use   Smoking status: Never   Smokeless tobacco: Never  Vaping Use   Vaping status: Never Used  Substance and Sexual Activity   Alcohol use: No   Drug use: No   Sexual activity: Not on file   Family History  Problem Relation Age of Onset   Heart Problems Mother    Cancer Father    Diabetes Other    Breast cancer Neg Hx    Immunization History  Administered Date(s) Administered   Influenza, High Dose Seasonal PF 06/12/2022   Influenza-Unspecified 07/04/2022   Pneumococcal Conjugate-13 02/23/2016   Respiratory Syncytial Virus Vaccine,Recomb Aduvanted(Arexvy) 06/12/2022     Review of Systems: Negative except as noted in the HPI.   Objective: There were no vitals filed for this visit.  Heather Small is a pleasant 88 y.o. female in NAD. AAO X 3.  Diabetic foot exam was performed with the following findings:   Vascular Examination: Capillary refill time immediate b/l. Vascular status intact b/l with palpable pedal pulses. Pedal hair present b/l. No pain with calf compression b/l. Skin temperature  gradient WNL b/l. No cyanosis or clubbing b/l. No ischemia or gangrene noted b/l.   Neurological Examination: Sensation grossly intact b/l with 10 gram monofilament. Vibratory sensation intact b/l.   Dermatological Examination: Pedal skin with normal turgor, texture and tone b/l.  No open wounds. No interdigital macerations.   Toenails 1-5 b/l thick, discolored, elongated with subungual debris and pain on dorsal palpation.   No hyperkeratotic nor porokeratotic lesions present on today's visit. Diffuse scaling noted peripherally and plantarly b/l feet.  No interdigital macerations.  No blisters, no weeping. No signs of secondary bacterial infection noted.  Musculoskeletal Examination: Muscle strength 5/5 to all lower extremity muscle groups bilaterally. No pain, crepitus or joint limitation noted with ROM bilateral LE. HAV with bunion bilaterally and hammertoes 2-5 b/l.  Radiographs: None     ADA Risk Categorization: Low Risk :  Patient has all of the following: Intact protective sensation No prior foot ulcer  No severe deformity Pedal pulses present  Assessment: 1. Pain due to onychomycosis of  toenails of both feet   2. Tinea pedis of both feet   3. Hallux valgus, acquired, bilateral   4. Acquired hammertoes of both feet   5. Diabetic peripheral neuropathy associated with type 2 diabetes mellitus (HCC)      Plan: Meds ordered this encounter  Medications   ketoconazole (NIZORAL) 2 % cream    Sig: Apply to both feet and between toes once daily for 6 weeks.    Dispense:  60 g    Refill:  0  -Patient was evaluated today. All questions/concerns addressed on today's visit. -Diabetic foot examination performed today. -Continue diabetic foot care principles: inspect feet daily, monitor glucose as recommended by PCP and/or Endocrinologist, and follow prescribed diet per PCP, Endocrinologist and/or dietician. -Patient to continue soft, supportive shoe gear daily. -Patient  instructed to spray shoes with Lysol disinfectant every evening. Patient instructed to clean bathtub/shower with bleach based cleanser to avoid reinfection or spreading athlete's feet to others. -Toenails 1-5 b/l were debrided in length and girth with sterile nail nippers and dremel without iatrogenic bleeding.  -For tinea pedis, Rx sent to pharmacy for Ketoconazole Cream 2% to be applied once daily for six weeks. -Patient/POA to call should there be question/concern in the interim. No follow-ups on file.  Freddie Breech, DPM      [place] LOCATION: [address] [place] [address]                   Office [phone]   [facility] LOCATION: [address] [place], [place] [address] Office [phone]

## 2024-02-20 ENCOUNTER — Ambulatory Visit (INDEPENDENT_AMBULATORY_CARE_PROVIDER_SITE_OTHER): Admitting: Podiatry

## 2024-02-20 ENCOUNTER — Encounter: Payer: Self-pay | Admitting: Podiatry

## 2024-02-20 VITALS — Ht 64.0 in | Wt 144.0 lb

## 2024-02-20 DIAGNOSIS — M79674 Pain in right toe(s): Secondary | ICD-10-CM | POA: Diagnosis not present

## 2024-02-20 DIAGNOSIS — E1142 Type 2 diabetes mellitus with diabetic polyneuropathy: Secondary | ICD-10-CM | POA: Diagnosis not present

## 2024-02-20 DIAGNOSIS — M79675 Pain in left toe(s): Secondary | ICD-10-CM

## 2024-02-20 DIAGNOSIS — B351 Tinea unguium: Secondary | ICD-10-CM

## 2024-02-20 DIAGNOSIS — R2681 Unsteadiness on feet: Secondary | ICD-10-CM

## 2024-02-26 ENCOUNTER — Encounter: Payer: Self-pay | Admitting: Podiatry

## 2024-02-26 NOTE — Progress Notes (Signed)
  Subjective:  Patient ID: Heather Small, female    DOB: 11-08-1935,  MRN: [id number]  Heather Small presents to clinic today for at risk foot care with history of diabetic neuropathy and painful thick toenails that are difficult to trim. Pain interferes with ambulation. Aggravating factors include wearing enclosed shoe gear. Pain is relieved with periodic professional debridement.  Chief Complaint  Patient presents with   Nail Problem    Pt is here for [facility] last A1C was 7 PCP is Dr Doria and LOV was in June.   PCP is Doria Proud, MD.  Allergies  Allergen Reactions   Codeine Anaphylaxis, Other (See Comments) and Swelling    Couldn't swallow, was rushed to hospital    Review of Systems: Negative except as noted in the HPI.  Objective: No changes noted in today's physical examination. There were no vitals filed for this visit. Heather Small is a pleasant 88 y.o. female WD, WN in NAD. AAO x 3.  Vascular Examination: Capillary refill time immediate b/l. Palpable pedal pulses. Pedal hair present b/l. No pain with calf compression b/l. Skin temperature gradient WNL b/l. No cyanosis or clubbing b/l. No ischemia or gangrene noted b/l.   Neurological Examination: Sensation grossly intact b/l with 10 gram monofilament. Vibratory sensation intact b/l. Pt has subjective symptoms of neuropathy.  Dermatological Examination: Pedal skin with normal turgor, texture and tone b/l.  No open wounds. No interdigital macerations.   Toenails 1-5 b/l thick, discolored, elongated with subungual debris and pain on dorsal palpation.   No corns, calluses nor porokeratotic lesions noted.  Musculoskeletal Examination: Muscle strength 5/5 to all lower extremity muscle groups bilaterally. HAV with bunion deformity noted b/l LE. Hammertoe(s) 2-5 b/l.  Radiographs: None  Assessment/Plan: Orders Placed This Encounter  Procedures   For home use only DME Other see comment    To [facility]:  Dispense one cane for gait instability and diabetic neuropathy.    Length of Need:   Lifetime   1. Pain due to onychomycosis of toenails of both feet   2. Gait instability   3. Diabetic peripheral neuropathy associated with type 2 diabetes mellitus (HCC)   Dispensed DME Rx for [facility] to supply one cane for gait instability. Consent given for treatment. Patient examined. All patient's and/or POA's questions/concerns addressed on today's visit. Mycotic toenails 1-5 debrided in length and girth without incident. Continue foot and shoe inspections daily. Monitor blood glucose per PCP/Endocrinologist's recommendations.Continue soft, supportive shoe gear daily. Report any pedal injuries to medical professional. Call office if there are any questions/concerns. -Patient/POA to call should there be question/concern in the interim.   Return in about 3 months (around 05/22/2024).  Heather Small, DPM      [place] LOCATION: [address], [place] [address]                   Office [phone]   [facility] LOCATION: [address] [place], [place] [address] Office [phone]

## 2024-05-18 ENCOUNTER — Other Ambulatory Visit: Payer: Self-pay | Admitting: Podiatry

## 2024-05-18 DIAGNOSIS — B353 Tinea pedis: Secondary | ICD-10-CM

## 2024-05-24 ENCOUNTER — Ambulatory Visit: Admitting: Podiatry

## 2024-05-24 ENCOUNTER — Encounter: Payer: Self-pay | Admitting: Podiatry

## 2024-05-24 DIAGNOSIS — M79675 Pain in left toe(s): Secondary | ICD-10-CM | POA: Diagnosis not present

## 2024-05-24 DIAGNOSIS — M79674 Pain in right toe(s): Secondary | ICD-10-CM

## 2024-05-24 DIAGNOSIS — E1142 Type 2 diabetes mellitus with diabetic polyneuropathy: Secondary | ICD-10-CM

## 2024-05-24 DIAGNOSIS — B351 Tinea unguium: Secondary | ICD-10-CM

## 2024-05-30 NOTE — Progress Notes (Signed)
  Subjective:  Patient ID: Heather Small, female    DOB: November 19, 1935,  MRN: [id number]  Heather Small presents to clinic today for at risk foot care with history of diabetic neuropathy and painful mycotic toenails x 10 which interfere with daily activities. Pain is relieved with periodic professional debridement.  Chief Complaint  Patient presents with   Toe Pain    A1c is 7. Dr. Doria is her PCP. Last visit was in June 2025   New problem(s): None.   PCP is Doria Proud, MD.  Allergies  Allergen Reactions   Codeine Anaphylaxis, Other (See Comments) and Swelling    Couldn't swallow, was rushed to hospital    Review of Systems: Negative except as noted in the HPI.  Objective: No changes noted in today's physical examination. There were no vitals filed for this visit. Marquesa Rath is a pleasant 88 y.o. female WD, WN in NAD. AAO x 3.  Vascular Examination: Capillary refill time immediate b/l. Vascular status intact b/l with palpable pedal pulses. Pedal hair present b/l. No pain with calf compression b/l. Skin temperature gradient WNL b/l. No cyanosis or clubbing b/l. No ischemia or gangrene noted b/l. No edema noted b/l LE.  Neurological Examination: Sensation grossly intact b/l with 10 gram monofilament. Vibratory sensation intact b/l. Pt has subjective symptoms of neuropathy.  Dermatological Examination: Pedal skin with normal turgor, texture and tone b/l.  No open wounds. No interdigital macerations.   Toenails 1-5 b/l thick, discolored, elongated with subungual debris and pain on dorsal palpation.   No hyperkeratotic nor porokeratotic lesions.  Musculoskeletal Examination: Muscle strength 5/5 to all lower extremity muscle groups bilaterally. HAV with bunion deformity noted b/l LE. Hammertoe(s) 2-5 b/l.  Radiographs: None  Assessment/Plan: 1. Pain due to onychomycosis of toenails of both feet   2. Diabetic peripheral neuropathy associated with type 2 diabetes mellitus  (HCC)   Patient was evaluated and treated. All patient's and/or POA's questions/concerns addressed on today's visit. Toenails 1-5 b/l debrided in length and girth without incident. Continue foot and shoe inspections daily. Monitor blood glucose per PCP/Endocrinologist's recommendations. Continue soft, supportive shoe gear daily. Report any pedal injuries to medical professional. Call office if there are any questions/concerns. -Patient/POA to call should there be question/concern in the interim.   Return in about 3 months (around 08/24/2024).  Delon LITTIE Merlin, DPM      [facility] LOCATION: [address] [place] [address]                   Office [phone]   [facility] LOCATION: [address] [place], [place] [address] Office [phone]

## 2024-09-02 ENCOUNTER — Ambulatory Visit: Admitting: Podiatry

## 2024-09-16 ENCOUNTER — Ambulatory Visit: Admitting: Podiatry

## 2024-09-16 ENCOUNTER — Encounter: Payer: Self-pay | Admitting: Podiatry

## 2024-09-21 ENCOUNTER — Ambulatory Visit: Admitting: Podiatry

## 2024-12-16 ENCOUNTER — Ambulatory Visit: Admitting: Podiatry
